# Patient Record
Sex: Male | Born: 1965 | Race: White | Hispanic: No | Marital: Single | Smoking: Never smoker
Health system: Southern US, Community
[De-identification: ages and names within clinical notes are randomized; demographics above are authoritative.]

## PROBLEM LIST (undated history)

## (undated) DIAGNOSIS — R000 Tachycardia, unspecified: Secondary | ICD-10-CM

## (undated) DIAGNOSIS — J986 Disorders of diaphragm: Secondary | ICD-10-CM

## (undated) DIAGNOSIS — I10 Essential (primary) hypertension: Secondary | ICD-10-CM

## (undated) DIAGNOSIS — E119 Type 2 diabetes mellitus without complications: Secondary | ICD-10-CM

## (undated) DIAGNOSIS — T300 Burn of unspecified body region, unspecified degree: Secondary | ICD-10-CM

## (undated) DIAGNOSIS — N433 Hydrocele, unspecified: Secondary | ICD-10-CM

## (undated) DIAGNOSIS — S82401A Unspecified fracture of shaft of right fibula, initial encounter for closed fracture: Secondary | ICD-10-CM

## (undated) DIAGNOSIS — N503 Cyst of epididymis: Secondary | ICD-10-CM

## (undated) DIAGNOSIS — K760 Fatty (change of) liver, not elsewhere classified: Secondary | ICD-10-CM

## (undated) DIAGNOSIS — M869 Osteomyelitis, unspecified: Secondary | ICD-10-CM

## (undated) DIAGNOSIS — I739 Peripheral vascular disease, unspecified: Secondary | ICD-10-CM

## (undated) DIAGNOSIS — N179 Acute kidney failure, unspecified: Secondary | ICD-10-CM

## (undated) DIAGNOSIS — Z8669 Personal history of other diseases of the nervous system and sense organs: Secondary | ICD-10-CM

## (undated) HISTORY — PX: KNEE ARTHROSCOPY: SUR90

## (undated) HISTORY — PX: TYMPANOSTOMY TUBE PLACEMENT: SHX32

## (undated) HISTORY — PX: TONSILLECTOMY: SUR1361

---

## 1997-12-17 ENCOUNTER — Emergency Department (HOSPITAL_COMMUNITY): Admission: EM | Admit: 1997-12-17 | Discharge: 1997-12-17 | Payer: Self-pay | Admitting: Emergency Medicine

## 2000-06-23 ENCOUNTER — Emergency Department (HOSPITAL_COMMUNITY): Admission: EM | Admit: 2000-06-23 | Discharge: 2000-06-24 | Payer: Self-pay | Admitting: Emergency Medicine

## 2000-06-27 ENCOUNTER — Emergency Department (HOSPITAL_COMMUNITY): Admission: EM | Admit: 2000-06-27 | Discharge: 2000-06-27 | Payer: Self-pay | Admitting: Emergency Medicine

## 2000-07-02 ENCOUNTER — Emergency Department (HOSPITAL_COMMUNITY): Admission: EM | Admit: 2000-07-02 | Discharge: 2000-07-02 | Payer: Self-pay | Admitting: Emergency Medicine

## 2000-07-24 ENCOUNTER — Encounter: Payer: Self-pay | Admitting: Emergency Medicine

## 2000-07-24 ENCOUNTER — Emergency Department (HOSPITAL_COMMUNITY): Admission: EM | Admit: 2000-07-24 | Discharge: 2000-07-24 | Payer: Self-pay | Admitting: Emergency Medicine

## 2001-09-13 ENCOUNTER — Emergency Department (HOSPITAL_COMMUNITY): Admission: EM | Admit: 2001-09-13 | Discharge: 2001-09-14 | Payer: Self-pay | Admitting: *Deleted

## 2003-05-02 ENCOUNTER — Emergency Department (HOSPITAL_COMMUNITY): Admission: EM | Admit: 2003-05-02 | Discharge: 2003-05-03 | Payer: Self-pay | Admitting: Emergency Medicine

## 2003-05-03 ENCOUNTER — Encounter: Payer: Self-pay | Admitting: Emergency Medicine

## 2003-07-01 ENCOUNTER — Emergency Department (HOSPITAL_COMMUNITY): Admission: EM | Admit: 2003-07-01 | Discharge: 2003-07-01 | Payer: Self-pay | Admitting: Emergency Medicine

## 2003-09-01 ENCOUNTER — Emergency Department (HOSPITAL_COMMUNITY): Admission: EM | Admit: 2003-09-01 | Discharge: 2003-09-01 | Payer: Self-pay | Admitting: Emergency Medicine

## 2004-04-06 ENCOUNTER — Emergency Department (HOSPITAL_COMMUNITY): Admission: EM | Admit: 2004-04-06 | Discharge: 2004-04-06 | Payer: Self-pay | Admitting: Emergency Medicine

## 2005-08-30 ENCOUNTER — Emergency Department (HOSPITAL_COMMUNITY): Admission: EM | Admit: 2005-08-30 | Discharge: 2005-08-30 | Payer: Self-pay | Admitting: Emergency Medicine

## 2005-12-18 ENCOUNTER — Emergency Department (HOSPITAL_COMMUNITY): Admission: EM | Admit: 2005-12-18 | Discharge: 2005-12-18 | Payer: Self-pay | Admitting: Emergency Medicine

## 2006-06-13 ENCOUNTER — Emergency Department (HOSPITAL_COMMUNITY): Admission: EM | Admit: 2006-06-13 | Discharge: 2006-06-13 | Payer: Self-pay | Admitting: *Deleted

## 2006-07-24 ENCOUNTER — Emergency Department (HOSPITAL_COMMUNITY): Admission: EM | Admit: 2006-07-24 | Discharge: 2006-07-25 | Payer: Self-pay | Admitting: Emergency Medicine

## 2006-07-29 ENCOUNTER — Emergency Department (HOSPITAL_COMMUNITY): Admission: EM | Admit: 2006-07-29 | Discharge: 2006-07-29 | Payer: Self-pay | Admitting: Emergency Medicine

## 2007-05-30 ENCOUNTER — Emergency Department (HOSPITAL_COMMUNITY): Admission: EM | Admit: 2007-05-30 | Discharge: 2007-05-30 | Payer: Self-pay | Admitting: Emergency Medicine

## 2007-09-04 ENCOUNTER — Emergency Department (HOSPITAL_COMMUNITY): Admission: EM | Admit: 2007-09-04 | Discharge: 2007-09-04 | Payer: Self-pay | Admitting: Family Medicine

## 2007-09-08 ENCOUNTER — Ambulatory Visit (HOSPITAL_COMMUNITY): Admission: RE | Admit: 2007-09-08 | Discharge: 2007-09-08 | Payer: Self-pay | Admitting: Specialist

## 2008-03-11 ENCOUNTER — Emergency Department (HOSPITAL_COMMUNITY): Admission: EM | Admit: 2008-03-11 | Discharge: 2008-03-11 | Payer: Self-pay | Admitting: Family Medicine

## 2008-09-02 ENCOUNTER — Emergency Department (HOSPITAL_COMMUNITY): Admission: EM | Admit: 2008-09-02 | Discharge: 2008-09-02 | Payer: Self-pay | Admitting: Family Medicine

## 2009-06-24 ENCOUNTER — Emergency Department (HOSPITAL_COMMUNITY): Admission: EM | Admit: 2009-06-24 | Discharge: 2009-06-24 | Payer: Self-pay | Admitting: Emergency Medicine

## 2010-06-07 ENCOUNTER — Emergency Department (HOSPITAL_COMMUNITY): Admission: EM | Admit: 2010-06-07 | Discharge: 2010-06-07 | Payer: Self-pay | Admitting: Emergency Medicine

## 2010-06-07 DIAGNOSIS — N503 Cyst of epididymis: Secondary | ICD-10-CM

## 2010-06-07 DIAGNOSIS — N433 Hydrocele, unspecified: Secondary | ICD-10-CM

## 2010-06-07 HISTORY — DX: Hydrocele, unspecified: N43.3

## 2010-06-07 HISTORY — DX: Cyst of epididymis: N50.3

## 2010-08-23 ENCOUNTER — Emergency Department (HOSPITAL_COMMUNITY)
Admission: EM | Admit: 2010-08-23 | Discharge: 2010-08-23 | Payer: Self-pay | Source: Home / Self Care | Admitting: Emergency Medicine

## 2010-08-27 ENCOUNTER — Emergency Department (HOSPITAL_COMMUNITY)
Admission: EM | Admit: 2010-08-27 | Discharge: 2010-08-27 | Payer: Self-pay | Source: Home / Self Care | Admitting: Emergency Medicine

## 2010-11-26 LAB — URINE CULTURE
Colony Count: NO GROWTH
Culture  Setup Time: 201109252018
Culture: NO GROWTH

## 2010-11-26 LAB — BLOOD GAS, VENOUS
Acid-base deficit: 0.3 mmol/L (ref 0.0–2.0)
Patient temperature: 98.6
TCO2: 21.8 mmol/L (ref 0–100)

## 2010-11-26 LAB — POCT I-STAT, CHEM 8
Calcium, Ion: 1.18 mmol/L (ref 1.12–1.32)
Chloride: 105 mEq/L (ref 96–112)
Glucose, Bld: 299 mg/dL — ABNORMAL HIGH (ref 70–99)
HCT: 48 % (ref 39.0–52.0)
Hemoglobin: 16.3 g/dL (ref 13.0–17.0)

## 2010-11-26 LAB — URINALYSIS, ROUTINE W REFLEX MICROSCOPIC
Glucose, UA: >1000 mg/dL — AB
Hgb urine dipstick: NEGATIVE
Ketones, ur: NEGATIVE mg/dL
Protein, ur: NEGATIVE mg/dL

## 2011-01-26 NOTE — Op Note (Signed)
NAME:  Douglas Murphy, Douglas Murphy NO.:  [id number]   MEDICAL RECORD NO.:  [id number]          PATIENT TYPE:  AMB   LOCATION:  SDS                          FACILITY:  [facility]   PHYSICIAN:  Kerrin Champagne, M.D.   DATE OF BIRTH:  1966/09/09   DATE OF PROCEDURE:  09/08/2007  DATE OF DISCHARGE:                               OPERATIVE REPORT   PREOPERATIVE DIAGNOSES:  Torn medial meniscus, internal derangement,  right knee; locked knee.   POSTOPERATIVE DIAGNOSES:  Medial shelf plica, hematoma over the medial  retinaculum of the knee with swelling and induration of the shelf plica.  Anterior horn medial meniscus tear.   PROCEDURE:  Diagnostic and operative arthroscopy, right knee, with  medial shelf plica excision, partial anterior horn medial meniscectomy  and chondroplastic shaving of medial femoral condyle.   SURGEON:  Kerrin Champagne, M.D.   ASSISTANT:  None.   ANESTHESIA:  MAC with local, Dr. Gypsy Balsam.   FINDINGS:  Torn anterior horn medial meniscus with fraying of fibers  along the anterior aspect of the cruciate ligament medially.  Degenerative chondromalacia, lateral portion, medial femoral condyle.  Hematoma over the anteromedial retinaculum with edema and swelling of  medial shelf plica.   SPECIMENS:  None.   ESTIMATED BLOOD LOSS:  10 mL.   COMPLICATIONS:  None.   TOTAL TOURNIQUET TIME:  Zero minutes.   Patient returned to the PACU in good condition.   HISTORY OF PRESENT ILLNESS:  Patient, a 45 year old male, reports having  been working Sunday, five days ago, with quite a bit of kneeling,  squatting, the following day with severe pain and swelling of the knee  with inability to either straighten the knee or flex the knee.  He had  locked right knee with inability to extend the knee greater than 30  degrees passively or actively, and flexion was only possible to about 90  degrees, inability to flex beyond this, either actively or passively.  Minimal or mild  effusion present.  He was seen in the office and  evaluated, had primarily posterior knee pain with inability to  straighten or flex the knee and findings consistent with a locked knee.  Attempts were made at manipulating the knee and were unsuccessful in the  office.  He underwent intra-articular injection with steroid and  Marcaine.  This seemed to decrease the discomfort somewhat.  Still  unable to fully straighten or extend the knee with local anesthesia.  He  was, therefore, scheduled to undergo arthroscopic evaluation for  internal derangement of the knee with likely bucket-handle tear of the  medial meniscus.   Intraoperative findings, though, demonstrated primarily medial shelf  plica and degenerative changes involving the lateral aspect, medial  femoral condyle.  There were anterior horn changes and torn meniscus  anteriorly, involving the anterior rim of the anterior meniscus of the  anterior horn.  This was debrided.  The medial shelf plica was excised.  Chondroplastic shaving undertaken with the lateral aspect of the medial  femoral condyle.   DESCRIPTION OF PROCEDURE:  After adequate face mask anesthesia, patient  had had local  anesthetic infiltrated into the right knee in the areas of  expected portals.  He had a standard prep with DuraPrep solution from  the right upper thigh to the right ankle.  A knee holder was used.  A  tourniquet was placed, but was not used during the case.  The patient  had his leg draped in the usual manner.  Coban with impervious  stockinette distally.   Patient then had further infiltration in expected areas of portals,  though slightly above those expected and slightly more medial and  slightly more lateral.  These were injected with Marcaine 0.5% with  2epinephrine, 10 mL and 5 mL each.  A stab incision was then made into  the anterolateral inferior quadrant of the knee for standard portal  here.  An #11 blade scalpel was used and then blunt  trocar used to make  an initial entry into the knee.  The sleeve for the arthroscope was then  passed through this anterolateral inferior portal and the knee then  inflated with irrigant solution and evaluated.  Immediately, torn  meniscus material was found to be present over the anterior aspect of  the patient's medial joint line.  A stab incision was made into the  medial compartment, after infiltration with Marcaine and a spinal needle  used for localization.  After stab incision was made, a probe was passed  and this was used to demonstrate the torn meniscus.  Collene Mares was then  passed and this area was shaved over the anterior aspect of the medial  meniscus and anterior horn region.   Following debridement, then evaluation of the notch demonstrated that  there were fibers along the anteromedial corner of the notch,  demonstrating further frayed appearance and these were debrided.  Lateral joint line evaluated and demonstrated no significant torn  meniscus.  The lateral portion of the intercondylar notch demonstrating  the ACL without any significant abnormalities here.  The suprapatellar  region was evaluated, as was the medial suprapatellar region, where a  large medial shelf plica was identified, as well as hematoma in the  anteromedial portion of the knee.  The medial shelf plica was enlarged  and thickened and this was debrided, using the 3.5 full-radius shaver.  Following shaving of this, then further evaluation demonstrated  chondromalacia changes, grade 2 and grade 3, involving lateral portion  of the medial femoral condyle and these were debrided, using the shaver,  back to a more smooth appearance.  With this, then the anterior horn was  further examined, some further shaving taking place to smooth the area  of previous torn cartilage.  Then irrigation was performed of the knee  joint and the arthroscope and instruments were then removed, following  removal of all irrigant  solution.   Medial and lateral portals were then closed with a single subcu stitch  of 3-0 Vicryl.  Dermabond was applied, 4 X 4's, ABD pad affixed to the  skin with Kerlix and an ACE wrap applied.  Patient was then reactivated,  extubated, reactivated and returned to the recovery room in satisfactory  condition.  All instrument and sponge counts were correct.   POSTOPERATIVE CARE:  The patient will be full weightbearing was  tolerated and to bend the knee as much as possible.  To be seen back in  the office in two weeks.  He may remove his dressings at four days  postoperatively and begin showering and bathing these areas, applying a  bandage after.  Return  to work expected on October 09, 2007.   MEDICINES AT DISCHARGE:  1. Percocet 5/325 #50 one or two every 4-6 hours p.r.n. pain.  2. Lodine etodolac 400 mg #60 one tab p.o. b.i.d.      Kerrin Champagne, M.D.  Electronically Signed     JEN/MEDQ  D:  09/08/2007  T:  09/08/2007  Job:  [id number]

## 2011-02-06 ENCOUNTER — Emergency Department (HOSPITAL_COMMUNITY)
Admission: EM | Admit: 2011-02-06 | Discharge: 2011-02-06 | Discharge status: Against Medical Advice | Payer: Self-pay | Attending: Emergency Medicine | Admitting: Emergency Medicine

## 2011-02-06 DIAGNOSIS — R070 Pain in throat: Secondary | ICD-10-CM | POA: Insufficient documentation

## 2011-02-07 ENCOUNTER — Emergency Department (HOSPITAL_COMMUNITY)
Admission: EM | Admit: 2011-02-07 | Discharge: 2011-02-07 | Disposition: A | Discharge status: Home / Self Care | Payer: Self-pay | Attending: Emergency Medicine | Admitting: Emergency Medicine

## 2011-02-07 DIAGNOSIS — R5381 Other malaise: Secondary | ICD-10-CM | POA: Insufficient documentation

## 2011-02-07 DIAGNOSIS — R059 Cough, unspecified: Secondary | ICD-10-CM | POA: Insufficient documentation

## 2011-02-07 DIAGNOSIS — R599 Enlarged lymph nodes, unspecified: Secondary | ICD-10-CM | POA: Insufficient documentation

## 2011-02-07 DIAGNOSIS — R0982 Postnasal drip: Secondary | ICD-10-CM | POA: Insufficient documentation

## 2011-02-07 DIAGNOSIS — H9209 Otalgia, unspecified ear: Secondary | ICD-10-CM | POA: Insufficient documentation

## 2011-02-07 DIAGNOSIS — R05 Cough: Secondary | ICD-10-CM | POA: Insufficient documentation

## 2011-02-07 DIAGNOSIS — J029 Acute pharyngitis, unspecified: Secondary | ICD-10-CM | POA: Insufficient documentation

## 2011-06-17 LAB — POCT RAPID STREP A: Streptococcus, Group A Screen (Direct): NEGATIVE

## 2011-06-18 LAB — CBC
HCT: 43.5
Hemoglobin: 14.7
RDW: 13.4

## 2011-06-24 LAB — POCT URINALYSIS DIP (DEVICE)
Bilirubin Urine: NEGATIVE
Hgb urine dipstick: NEGATIVE
Nitrite: NEGATIVE
Specific Gravity, Urine: 1.02
pH: 6

## 2012-04-17 ENCOUNTER — Encounter (HOSPITAL_COMMUNITY): Payer: Self-pay | Admitting: *Deleted

## 2012-04-17 ENCOUNTER — Emergency Department (HOSPITAL_COMMUNITY)
Admission: EM | Admit: 2012-04-17 | Discharge: 2012-04-17 | Disposition: A | Discharge status: Home / Self Care | Payer: Self-pay | Attending: Emergency Medicine | Admitting: Emergency Medicine

## 2012-04-17 DIAGNOSIS — M542 Cervicalgia: Secondary | ICD-10-CM | POA: Insufficient documentation

## 2012-04-17 DIAGNOSIS — M5382 Other specified dorsopathies, cervical region: Secondary | ICD-10-CM

## 2012-04-17 DIAGNOSIS — M25519 Pain in unspecified shoulder: Secondary | ICD-10-CM | POA: Insufficient documentation

## 2012-04-17 MED ORDER — CYCLOBENZAPRINE HCL 10 MG PO TABS: 10.0000 mg | ORAL_TABLET | Freq: Two times a day (BID) | ORAL | Status: AC | PRN

## 2012-04-17 MED ORDER — IBUPROFEN 200 MG PO TABS: 400.0000 mg | ORAL_TABLET | Freq: Three times a day (TID) | ORAL | Status: AC | PRN

## 2012-04-17 NOTE — ED Provider Notes (Signed)
History     CSN: [id number]  Arrival date & time 04/17/12  0704   First MD Initiated Contact with Patient 04/17/12 [phone]      Chief Complaint  Patient presents with  . Shoulder Pain  . Neck Pain  . Back Pain    (Consider location/radiation/quality/duration/timing/severity/associated sxs/prior treatment) HPI This is a 46 year old man without significant past medical history who presents with right shoulder and neck pain. The pain started last night and it has been constant since then data on a radial 6/10. The pain is dull in nature and increased by head movement to the left side and raising the right shoulder. Without these movements his pain is about 2/10. He was mowing his yard yesterday and he remembers jacking his right hand and shoulder. No other complaints.   History reviewed. No pertinent past medical history.  Past Surgical History  Procedure Date  . Knee arthroscopy     No family history on file.  History  Substance Use Topics  . Smoking status: Never Smoker   . Smokeless tobacco: Not on file  . Alcohol Use: No      Review of Systems  All other systems reviewed and are negative.    Allergies  Review of patient's allergies indicates no known allergies.  Home Medications  No current outpatient prescriptions on file.  BP 123/97  Pulse 78  Temp 98 F (36.7 C) (Oral)  Resp 16  Ht 6' (1.829 m)  Wt 215 lb (97.523 kg)  BMI 29.16 kg/m2  SpO2 100%  Physical Exam  Constitutional: He is oriented to person, place, and time. He appears well-developed and well-nourished. No distress.  HENT:  Head: Normocephalic and atraumatic.  Eyes: Conjunctivae are normal. Pupils are equal, round, and reactive to light.  Neck: Normal range of motion. Neck supple.    Cardiovascular: Normal rate and regular rhythm.   Pulmonary/Chest: Effort normal and breath sounds normal.  Abdominal: Soft. Bowel sounds are normal.  Neurological: He is alert and oriented to person,  place, and time.  Skin: Skin is warm and dry. He is not diaphoretic.    ED Course  Procedures (including critical care time)     MDM  Right Shoulder and neck pain this otherwise health man is musculoskeletal pain. I will give him a course of Motrin. Encourage to rest his right shoulder for 3 days.     Dow Adolph, MD 04/17/12 [phone]

## 2012-04-17 NOTE — ED Notes (Signed)
Pt reports onset of right shoulder, neck, upper back pain since last night. Denies known injury.

## 2012-04-17 NOTE — ED Provider Notes (Signed)
I saw and evaluated the patient, reviewed the resident's note and I agree with the findings and plan.  Pt with R neck muscle spasm. No injury, no neurologic complaints. Motrin/Flexeril and work note.   Charles B. Bernette Mayers, MD 04/17/12 [phone]

## 2012-07-11 ENCOUNTER — Emergency Department (HOSPITAL_COMMUNITY)
Admission: EM | Admit: 2012-07-11 | Discharge: 2012-07-11 | Disposition: A | Discharge status: Home / Self Care | Payer: Self-pay | Attending: Emergency Medicine | Admitting: Emergency Medicine

## 2012-07-11 ENCOUNTER — Encounter (HOSPITAL_COMMUNITY): Payer: Self-pay | Admitting: Emergency Medicine

## 2012-07-11 DIAGNOSIS — M79609 Pain in unspecified limb: Secondary | ICD-10-CM | POA: Insufficient documentation

## 2012-07-11 DIAGNOSIS — R1030 Lower abdominal pain, unspecified: Secondary | ICD-10-CM

## 2012-07-11 DIAGNOSIS — E119 Type 2 diabetes mellitus without complications: Secondary | ICD-10-CM | POA: Insufficient documentation

## 2012-07-11 DIAGNOSIS — R109 Unspecified abdominal pain: Secondary | ICD-10-CM | POA: Insufficient documentation

## 2012-07-11 DIAGNOSIS — R197 Diarrhea, unspecified: Secondary | ICD-10-CM | POA: Insufficient documentation

## 2012-07-11 LAB — POCT I-STAT, CHEM 8
BUN: 19 mg/dL (ref 6–23)
Creatinine, Ser: 0.8 mg/dL (ref 0.50–1.35)
Glucose, Bld: 339 mg/dL — ABNORMAL HIGH (ref 70–99)
Hemoglobin: 15.6 g/dL (ref 13.0–17.0)
TCO2: 26 mmol/L (ref 0–100)

## 2012-07-11 LAB — URINALYSIS, ROUTINE W REFLEX MICROSCOPIC
Bilirubin Urine: NEGATIVE
Glucose, UA: >1000 mg/dL — AB
Hgb urine dipstick: NEGATIVE
Specific Gravity, Urine: 1.042 — ABNORMAL HIGH (ref 1.005–1.030)
pH: 6.5 (ref 5.0–8.0)

## 2012-07-11 LAB — CBC WITH DIFFERENTIAL/PLATELET
Eosinophils Absolute: 0.3 10*3/uL (ref 0.0–0.7)
Eosinophils Relative: 4 % (ref 0–5)
HCT: 42.8 % (ref 39.0–52.0)
Hemoglobin: 15.4 g/dL (ref 13.0–17.0)
Lymphocytes Relative: 38 % (ref 12–46)
Lymphs Abs: 3.2 10*3/uL (ref 0.7–4.0)
MCH: 30.7 pg (ref 26.0–34.0)
MCV: 85.4 fL (ref 78.0–100.0)
Monocytes Relative: 7 % (ref 3–12)
RBC: 5.01 MIL/uL (ref 4.22–5.81)

## 2012-07-11 LAB — GLUCOSE, CAPILLARY: Glucose-Capillary: 347 mg/dL — ABNORMAL HIGH (ref 70–99)

## 2012-07-11 LAB — URINE MICROSCOPIC-ADD ON

## 2012-07-11 MED ORDER — TRAMADOL HCL 50 MG PO TABS: 50.0000 mg | ORAL_TABLET | Freq: Four times a day (QID) | ORAL | Status: DC | PRN

## 2012-07-11 MED ORDER — HYDROCODONE-ACETAMINOPHEN 5-325 MG PO TABS
1.0000 | ORAL_TABLET | Freq: Once | ORAL | Status: DC
  Filled 2012-07-11: qty 1

## 2012-07-11 MED ORDER — METFORMIN HCL 500 MG PO TABS: 500.0000 mg | ORAL_TABLET | Freq: Two times a day (BID) | ORAL | Status: DC

## 2012-07-11 NOTE — ED Notes (Signed)
Patient states started having R abdominal pain, R groin pain and RLE pain starting yesterday.  Patient states did have some diarrhea yesterday, but denies today.   Patient described pain as 7/10 sharp intermittent pain.

## 2012-07-11 NOTE — ED Provider Notes (Signed)
History     CSN: [id number]  Arrival date & time 07/11/12  0714   First MD Initiated Contact with Patient 07/11/12 [phone]      Chief Complaint  Patient presents with  . Abdominal Pain  . Groin Pain  . Leg Pain    (Consider location/radiation/quality/duration/timing/severity/associated sxs/prior treatment) HPI Comments: Patient presents with c/o R lower quadrant abd pain, R groin, R upper leg, and bilateral testicular pain since last evening. Pain described as dull and aching. It is 7/10. No fever, N/V. No urinary symptoms including dysuria, hematuria. No hernia. Pt states that he had episode of diarrhea last night. No definite injury. Pain is worse with movement and palpation. Nothing makes it better, no treatments prior to arrival. No h/o abdominal surgeries. No h/o blood clots. No recent immobilizations or surgeries. The onset of this condition was gradual. The course is constant.    The history is provided by the patient.    History reviewed. No pertinent past medical history.  Past Surgical History  Procedure Date  . Knee arthroscopy     No family history on file.  History  Substance Use Topics  . Smoking status: Never Smoker   . Smokeless tobacco: Not on file  . Alcohol Use: No      Review of Systems  Constitutional: Negative for fever.  HENT: Negative for sore throat and rhinorrhea.   Eyes: Negative for redness.  Respiratory: Negative for cough.   Cardiovascular: Negative for chest pain and leg swelling.  Gastrointestinal: Positive for abdominal pain and diarrhea. Negative for nausea and vomiting.  Genitourinary: Positive for testicular pain. Negative for dysuria, urgency, frequency, hematuria, flank pain, decreased urine volume, discharge, penile swelling, scrotal swelling and difficulty urinating.  Musculoskeletal: Negative for myalgias.  Skin: Negative for rash.  Neurological: Negative for headaches.    Allergies  Review of patient's allergies indicates  no known allergies.  Home Medications   Current Outpatient Rx  Name Route Sig Dispense Refill  . ALKA-SELTZER ANTACID PO Oral Take 1 tablet by mouth daily as needed. For stomach acid      BP 125/85  Pulse 90  Temp 97.8 F (36.6 C) (Oral)  Resp 16  Ht 6' (1.829 m)  Wt 210 lb (95.255 kg)  BMI 28.48 kg/m2  SpO2 98%  Physical Exam  Nursing note and vitals reviewed. Constitutional: He appears well-developed and well-nourished.  HENT:  Head: Normocephalic and atraumatic.  Eyes: Conjunctivae normal are normal. Right eye exhibits no discharge. Left eye exhibits no discharge.  Neck: Normal range of motion. Neck supple.  Cardiovascular: Normal rate, regular rhythm and normal heart sounds.   Pulmonary/Chest: Effort normal and breath sounds normal.  Abdominal: Soft. There is tenderness in the right lower quadrant. There is no rebound and no guarding. Hernia confirmed negative in the right inguinal area and confirmed negative in the left inguinal area.    Genitourinary: Right testis shows tenderness. Right testis shows no mass and no swelling. Left testis shows tenderness. Left testis shows no mass and no swelling. No penile erythema. No discharge found.  Musculoskeletal:       Right hip: He exhibits tenderness. He exhibits normal range of motion and normal strength.       Legs: Lymphadenopathy:       Right: No inguinal adenopathy present.       Left: No inguinal adenopathy present.  Neurological: He is alert.  Skin: Skin is warm and dry.  Psychiatric: He has a normal mood and affect.  ED Course  Procedures (including critical care time)  Labs Reviewed  URINALYSIS, ROUTINE W REFLEX MICROSCOPIC - Abnormal; Notable for the following:    Specific Gravity, Urine 1.042 (*)     Glucose, UA >1000 (*)     Ketones, ur 15 (*)     All other components within normal limits  URINE MICROSCOPIC-ADD ON - Abnormal; Notable for the following:    Bacteria, UA FEW (*)     All other components  within normal limits  GLUCOSE, CAPILLARY - Abnormal; Notable for the following:    Glucose-Capillary 347 (*)     All other components within normal limits  POCT I-STAT, CHEM 8 - Abnormal; Notable for the following:    Glucose, Bld 339 (*)     Calcium, Ion 1.24 (*)     All other components within normal limits  CBC WITH DIFFERENTIAL   No results found.   1. Groin pain   2. Diabetes     7:33 AM Patient seen and examined. Work-up initiated. Medications ordered.   Vital signs reviewed and are as follows: Filed Vitals:   07/11/12 0726  BP: 125/85  Pulse: 90  Temp: 97.8 F (36.6 C)  Resp: 16   RLQ abd pain/groin pain/upper leg pain: no discernable inguinal or abdominal hernia, no severe pain to tenderness to suggest torsion, no leg swelling to suggest DVT, no back pain or hematuria to suggest ureteral stone, no skin findings to suggest cellulitis. At this point findings seem musculoskeletal in nature.   CBC and UA ordered and are pending.   9:03 AM CBG 347. Will check kidney fcn and start on metformin. Patient informed that he will need PCP f/u. States his father saw Dr. Waynard Edwards in past and can try to follow-up. Both mother and father had DM as adults.    9:17 AM Patient d/w and seen by Dr. Anitra Lauth. Bedside ultrasound used to visualize femoral vein. Vein is easily compressible along the upper thigh. No evidence of DVT.   10:04 AM Renal fcn normal. Patient counseled on metformin use. Again stressed importance of PCP follow-up. Referrals given.   Patient urged to return with worsening symptoms or other concerns. Patient verbalized understanding and agrees with plan.     MDM  Groin pain: likely muscle strain  DM: new diagnosis, started on metformin 500mg  bid. Urged PCP follow-up.        Renne Crigler, [place] 07/11/12 1005

## 2012-07-11 NOTE — ED Provider Notes (Signed)
Medical screening examination/treatment/procedure(s) were conducted as a shared visit with non-physician practitioner(s) and myself.  I personally evaluated the patient during the encounter Patient with right lower abdominal tenderness and upper thigh pain. Denies any urinary symptoms and is well-appearing. He is able to flex and extend his thigh without difficulty. Bedside ultrasound there is an easily compressible femoral vein. Labs are normal except for a new diagnosis of diabetes. Patient was placed on metformin and will followup with PCP  Gwyneth Sprout, MD 07/11/12 1132

## 2012-09-03 ENCOUNTER — Emergency Department (HOSPITAL_COMMUNITY): Payer: Self-pay

## 2012-09-03 ENCOUNTER — Encounter (HOSPITAL_COMMUNITY): Payer: Self-pay | Admitting: Emergency Medicine

## 2012-09-03 ENCOUNTER — Emergency Department (HOSPITAL_COMMUNITY)
Admission: EM | Admit: 2012-09-03 | Discharge: 2012-09-03 | Disposition: A | Discharge status: Home / Self Care | Payer: Self-pay | Attending: Emergency Medicine | Admitting: Emergency Medicine

## 2012-09-03 DIAGNOSIS — IMO0001 Reserved for inherently not codable concepts without codable children: Secondary | ICD-10-CM

## 2012-09-03 DIAGNOSIS — R739 Hyperglycemia, unspecified: Secondary | ICD-10-CM

## 2012-09-03 DIAGNOSIS — R110 Nausea: Secondary | ICD-10-CM | POA: Insufficient documentation

## 2012-09-03 DIAGNOSIS — R1013 Epigastric pain: Secondary | ICD-10-CM | POA: Insufficient documentation

## 2012-09-03 DIAGNOSIS — K219 Gastro-esophageal reflux disease without esophagitis: Secondary | ICD-10-CM | POA: Insufficient documentation

## 2012-09-03 DIAGNOSIS — R509 Fever, unspecified: Secondary | ICD-10-CM | POA: Insufficient documentation

## 2012-09-03 DIAGNOSIS — R7309 Other abnormal glucose: Secondary | ICD-10-CM | POA: Insufficient documentation

## 2012-09-03 LAB — URINALYSIS, MICROSCOPIC ONLY
Bilirubin Urine: NEGATIVE
Leukocytes, UA: NEGATIVE
Nitrite: NEGATIVE
Specific Gravity, Urine: 1.042 — ABNORMAL HIGH (ref 1.005–1.030)
Urine-Other: NONE SEEN
Urobilinogen, UA: 0.2 mg/dL (ref 0.0–1.0)
pH: 7 (ref 5.0–8.0)

## 2012-09-03 LAB — COMPREHENSIVE METABOLIC PANEL
Albumin: 3.4 g/dL — ABNORMAL LOW (ref 3.5–5.2)
Alkaline Phosphatase: 132 U/L — ABNORMAL HIGH (ref 39–117)
BUN: 14 mg/dL (ref 6–23)
Calcium: 9.5 mg/dL (ref 8.4–10.5)
GFR calc Af Amer: >90 mL/min (ref >90)
Potassium: 4.2 mEq/L (ref 3.5–5.1)
Sodium: 133 mEq/L — ABNORMAL LOW (ref 135–145)
Total Protein: 6.9 g/dL (ref 6.0–8.3)

## 2012-09-03 LAB — CBC WITH DIFFERENTIAL/PLATELET
Basophils Relative: 0 % (ref 0–1)
Eosinophils Absolute: 0.3 10*3/uL (ref 0.0–0.7)
Eosinophils Relative: 4 % (ref 0–5)
MCH: 30.2 pg (ref 26.0–34.0)
MCHC: 35.6 g/dL (ref 30.0–36.0)
MCV: 85 fL (ref 78.0–100.0)
Monocytes Relative: 11 % (ref 3–12)
Neutrophils Relative %: 56 % (ref 43–77)
Platelets: 171 10*3/uL (ref 150–400)

## 2012-09-03 LAB — LIPASE, BLOOD: Lipase: 24 U/L (ref 11–59)

## 2012-09-03 LAB — RAPID STREP SCREEN (MED CTR MEBANE ONLY): Streptococcus, Group A Screen (Direct): NEGATIVE

## 2012-09-03 LAB — GLUCOSE, CAPILLARY: Glucose-Capillary: 299 mg/dL — ABNORMAL HIGH (ref 70–99)

## 2012-09-03 MED ORDER — SODIUM CHLORIDE 0.9 % IV BOLUS (SEPSIS)
1000.0000 mL | Freq: Once | INTRAVENOUS | Status: AC
  Administered 2012-09-03: 1000 mL via INTRAVENOUS

## 2012-09-03 MED ORDER — SODIUM CHLORIDE 0.9 % IV SOLN
1000.0000 mL | Freq: Once | INTRAVENOUS | Status: AC
  Administered 2012-09-03: 1000 mL via INTRAVENOUS

## 2012-09-03 MED ORDER — GI COCKTAIL ~~LOC~~
30.0000 mL | Freq: Once | ORAL | Status: AC
  Administered 2012-09-03: 30 mL via ORAL
  Filled 2012-09-03: qty 30

## 2012-09-03 MED ORDER — ONDANSETRON HCL 4 MG/2ML IJ SOLN
4.0000 mg | Freq: Once | INTRAMUSCULAR | Status: AC
  Administered 2012-09-03: 4 mg via INTRAVENOUS
  Filled 2012-09-03: qty 2

## 2012-09-03 MED ORDER — FAMOTIDINE IN NACL 20-0.9 MG/50ML-% IV SOLN
20.0000 mg | Freq: Once | INTRAVENOUS | Status: AC
  Administered 2012-09-03: 20 mg via INTRAVENOUS
  Filled 2012-09-03: qty 50

## 2012-09-03 MED ORDER — METFORMIN HCL 500 MG PO TABS: 500.0000 mg | ORAL_TABLET | Freq: Two times a day (BID) | ORAL | Status: DC

## 2012-09-03 MED ORDER — METOCLOPRAMIDE HCL 10 MG PO TABS: 10.0000 mg | ORAL_TABLET | Freq: Four times a day (QID) | ORAL | Status: DC

## 2012-09-03 NOTE — ED Notes (Signed)
Bedside report received from previous RN 

## 2012-09-03 NOTE — ED Notes (Addendum)
Pt back from X-ray.  

## 2012-09-03 NOTE — ED Notes (Signed)
Pt presenting to ed with c/o sore throat x 3 days and abdominal pain with positive nausea no vomiting no diarrhea.

## 2012-09-03 NOTE — ED Provider Notes (Signed)
History     CSN: [id number]  Arrival date & time 09/03/12  1604   First MD Initiated Contact with Patient 09/03/12 1628      Chief Complaint  Patient presents with  . Sore Throat  . Abdominal Pain    (Consider location/radiation/quality/duration/timing/severity/associated sxs/prior treatment) The history is provided by the patient.  Arun Herrod is a 46 y.o. male here with ab pain, sore throat. Sore throat 3 days ago, subjective fever. No difficulty swallowing but felt nauseous. He is able to eat food but is nauseous but denies vomiting or diarrhea. He took some Alka-Seltzer but it didn't help. Some epigastric pain but denies any abdominal surgeries in the past. No chest pain no cough.   History reviewed. No pertinent past medical history.  Past Surgical History  Procedure Date  . Knee arthroscopy     No family history on file.  History  Substance Use Topics  . Smoking status: Never Smoker   . Smokeless tobacco: Not on file  . Alcohol Use: No      Review of Systems  HENT: Positive for sore throat.   Gastrointestinal: Positive for nausea and abdominal pain.  All other systems reviewed and are negative.    Allergies  Review of patient's allergies indicates no known allergies.  Home Medications   Current Outpatient Rx  Name  Route  Sig  Dispense  Refill  . THERAFLU FLU/COLD PO   Oral   Take 1 packet by mouth daily as needed. For flu symptoms.           BP 130/88  Pulse 98  Temp 98.1 F (36.7 C) (Oral)  Resp 20  SpO2 97%  Physical Exam  Nursing note and vitals reviewed. Constitutional: He is oriented to person, place, and time. He appears well-developed and well-nourished.  HENT:  Head: Normocephalic.       OP- mild erythema   Eyes: Conjunctivae normal are normal. Pupils are equal, round, and reactive to light.  Neck: Normal range of motion. Neck supple.       No cervical LAD   Cardiovascular: Normal rate, regular rhythm and normal  heart sounds.   Pulmonary/Chest: Effort normal and breath sounds normal. No respiratory distress. He has no wheezes. He has no rales.  Abdominal: Soft. Bowel sounds are normal.       Mild epigastric tenderness, no murphy sign.   Musculoskeletal: Normal range of motion.  Neurological: He is alert and oriented to person, place, and time.  Skin: Skin is warm and dry.  Psychiatric: He has a normal mood and affect. His behavior is normal. Judgment and thought content normal.    ED Course  Procedures (including critical care time)  Labs Reviewed  COMPREHENSIVE METABOLIC PANEL - Abnormal; Notable for the following:    Sodium 133 (*)     Chloride 95 (*)     Glucose, Bld 452 (*)     Albumin 3.4 (*)     ALT 58 (*)     Alkaline Phosphatase 132 (*)     Total Bilirubin 0.2 (*)     All other components within normal limits  URINALYSIS, MICROSCOPIC ONLY - Abnormal; Notable for the following:    APPearance CLOUDY (*)     Specific Gravity, Urine 1.042 (*)     Glucose, UA >1000 (*)     All other components within normal limits  GLUCOSE, CAPILLARY - Abnormal; Notable for the following:    Glucose-Capillary 299 (*)  All other components within normal limits  CBC WITH DIFFERENTIAL  LIPASE, BLOOD  RAPID STREP SCREEN   Dg Abd Acute W/chest  09/03/2012  *RADIOLOGY REPORT*  Clinical Data: Abdominal pain  ACUTE ABDOMEN SERIES (ABDOMEN 2 VIEW & CHEST 1 VIEW)  Comparison:  None.  Findings:  There is no evidence of dilated bowel loops or free intraperitoneal air.  No radiopaque calculi or other significant radiographic abnormality is seen. Heart size and mediastinal contours are within normal limits.  Both lungs are clear.  IMPRESSION: Negative abdominal radiographs.  No acute cardiopulmonary disease.   Original Report Authenticated By: Christiana Pellant, M.D.      No diagnosis found.    MDM  Kert Shackett is a 46 y.o. male here with epigastric pain, sore throat. Will get strep swab. He  likely has viral syndrome vs reflux. Will get labs, lipase, ab xrays. Will reassess after GI cocktail and zofran and fluids.   7:51 PM Patient felt better. Glucose 450 on CMP. I looked over previous notes, patient prescribed metformin 500mg  BID 2 months ago but hasn't been taking it or following up. Patient has no anion gap on CMP. After 2L IVF, glucose dec to 299. Xray ab unremarkable. I think his symptoms are consistent with hyperglycemia but he is not in DKA. I told him to take metform and gave him list of resources to follow up.        Richardean Canal, MD 09/03/12 [phone]

## 2012-09-03 NOTE — ED Notes (Signed)
Reports abdominal pain for 2 days and points all over abd. accompanied with nausea on & off. Rated worse pain scale 5/10 now 3-4/10.  Last BM today WNL. 3 days ago experienced sore throat.  General assessment is a patient conscious and alert with acute distress.

## 2012-11-11 DIAGNOSIS — Z8669 Personal history of other diseases of the nervous system and sense organs: Secondary | ICD-10-CM

## 2012-11-11 HISTORY — DX: Personal history of other diseases of the nervous system and sense organs: Z86.69

## 2012-11-18 ENCOUNTER — Emergency Department (HOSPITAL_COMMUNITY)
Admission: EM | Admit: 2012-11-18 | Discharge: 2012-11-18 | Disposition: A | Discharge status: Home / Self Care | Payer: Self-pay | Attending: Emergency Medicine | Admitting: Emergency Medicine

## 2012-11-18 DIAGNOSIS — G510 Bell's palsy: Secondary | ICD-10-CM | POA: Insufficient documentation

## 2012-11-18 DIAGNOSIS — E119 Type 2 diabetes mellitus without complications: Secondary | ICD-10-CM | POA: Insufficient documentation

## 2012-11-18 DIAGNOSIS — R29810 Facial weakness: Secondary | ICD-10-CM | POA: Insufficient documentation

## 2012-11-18 DIAGNOSIS — R5381 Other malaise: Secondary | ICD-10-CM | POA: Insufficient documentation

## 2012-11-18 LAB — GLUCOSE, CAPILLARY

## 2012-11-18 MED ORDER — METFORMIN HCL 500 MG PO TABS: 500.0000 mg | ORAL_TABLET | Freq: Two times a day (BID) | ORAL | Status: DC

## 2012-11-18 MED ORDER — ERYTHROMYCIN 5 MG/GM OP OINT
TOPICAL_OINTMENT | Freq: Every day | OPHTHALMIC | Status: DC
  Administered 2012-11-18: 20:00:00 via OPHTHALMIC
  Filled 2012-11-18: qty 3.5

## 2012-11-18 MED ORDER — ACYCLOVIR 400 MG PO TABS: 400.0000 mg | ORAL_TABLET | Freq: Four times a day (QID) | ORAL | Status: DC

## 2012-11-18 MED ORDER — PREDNISONE 20 MG PO TABS: 40.0000 mg | ORAL_TABLET | Freq: Every day | ORAL | Status: DC

## 2012-11-18 NOTE — ED Notes (Signed)
RUE:[id number]<JW> Expected date:<BR> Expected time:<BR> Means of arrival:<BR> Comments:<BR> hold

## 2012-11-18 NOTE — ED Provider Notes (Addendum)
History     CSN: [id number]  Arrival date & time 11/18/12  1248   First MD Initiated Contact with Patient 11/18/12 1756      Chief Complaint  Patient presents with  . Numbness    (Consider location/radiation/quality/duration/timing/severity/associated sxs/prior treatment) Patient is a 47 y.o. male presenting with weakness. The history is provided by the patient.  Weakness This is a new problem. The current episode started yesterday. The problem occurs constantly. The problem has not changed since onset.Pertinent negatives include no headaches and no shortness of breath. Associated symptoms comments: Left sided facial droop starting last night without any pain or numbness. Nothing aggravates the symptoms. Nothing relieves the symptoms. He has tried nothing for the symptoms. The treatment provided no relief.    No past medical history on file.  Past Surgical History  Procedure Laterality Date  . Knee arthroscopy      No family history on file.  History  Substance Use Topics  . Smoking status: Never Smoker   . Smokeless tobacco: Not on file  . Alcohol Use: No      Review of Systems  Respiratory: Negative for cough and shortness of breath.   Neurological: Positive for facial asymmetry and weakness. Negative for seizures, speech difficulty, numbness and headaches.  All other systems reviewed and are negative.    Allergies  Review of patient's allergies indicates no known allergies.  Home Medications  No current outpatient prescriptions on file.  BP 118/80  Pulse 91  Temp(Src) 98.6 F (37 C) (Oral)  Resp 18  SpO2 99%  Physical Exam  Nursing note and vitals reviewed. Constitutional: He is oriented to person, place, and time. He appears well-developed and well-nourished. No distress.  HENT:  Head: Normocephalic and atraumatic.  Right Ear: Tympanic membrane and ear canal normal.  Left Ear: Tympanic membrane and ear canal normal.  Mouth/Throat: Oropharynx is  clear and moist.  Eyes: Conjunctivae and EOM are normal. Pupils are equal, round, and reactive to light.  Neck: Normal range of motion. Neck supple.  Cardiovascular: Normal rate, regular rhythm and intact distal pulses.   No murmur heard. Pulmonary/Chest: Effort normal and breath sounds normal. No respiratory distress. He has no wheezes. He has no rales.  Abdominal: Soft. He exhibits no distension. There is no tenderness. There is no rebound and no guarding.  Musculoskeletal: Normal range of motion. He exhibits no edema and no tenderness.  Neurological: He is alert and oriented to person, place, and time. He has normal strength. A cranial nerve deficit is present. No sensory deficit. He displays a negative Romberg sign. Coordination and gait normal.  Left sided facial droop.  Asymmetrical smile. Unable to for a brow on the left. Normal sensation bilaterally. Is able to close eye completely  Skin: Skin is warm and dry. No rash noted. No erythema.  Psychiatric: He has a normal mood and affect. His behavior is normal.    ED Course  Procedures (including critical care time)  Labs Reviewed  GLUCOSE, CAPILLARY - Abnormal; Notable for the following:    Glucose-Capillary 321 (*)    All other components within normal limits   No results found.   1. Bell's palsy       MDM   Patient presenting complaining of left-sided facial droop that started last night about 7:30 PM. He denies any other symptoms and has classic symptoms of Bell's palsy on exam. He has no other medical issues except for a intermittent elevated blood pressure on CBG checks.  We'll recheck his CBG today and start him on acyclovir and prednisone. He will followup with a PCP.  7:04 PM Pt also has a BS of 327 and pt was started on metformin.  He has had multiple elevated blood sugars in the past and was not started on meds.      Gwyneth Sprout, MD 11/18/12 1825  Gwyneth Sprout, MD 11/18/12 7829

## 2012-11-18 NOTE — ED Notes (Addendum)
Pt states that he noticed left sided facial numbness around 0730 last night.  No other sx.  No medical problems.  States his dad had bell's palsy.

## 2013-01-16 ENCOUNTER — Emergency Department (HOSPITAL_COMMUNITY)
Admission: EM | Admit: 2013-01-16 | Discharge: 2013-01-16 | Disposition: A | Discharge status: Home / Self Care | Payer: Self-pay | Attending: Emergency Medicine | Admitting: Emergency Medicine

## 2013-01-16 ENCOUNTER — Encounter (HOSPITAL_COMMUNITY): Payer: Self-pay | Admitting: Emergency Medicine

## 2013-01-16 DIAGNOSIS — H60509 Unspecified acute noninfective otitis externa, unspecified ear: Secondary | ICD-10-CM | POA: Insufficient documentation

## 2013-01-16 DIAGNOSIS — H6092 Unspecified otitis externa, left ear: Secondary | ICD-10-CM

## 2013-01-16 MED ORDER — NEOMYCIN-COLIST-HC-THONZONIUM 3.3-3-10-0.5 MG/ML OT SUSP: 3.0000 [drp] | Freq: Once | OTIC | Status: DC

## 2013-01-16 MED ORDER — NEOMYCIN-POLYMYXIN-HC 3.5-10000-1 OT SUSP
3.0000 [drp] | Freq: Once | OTIC | Status: AC
  Administered 2013-01-16: 3 [drp] via OTIC
  Filled 2013-01-16: qty 10

## 2013-01-16 NOTE — ED Provider Notes (Signed)
History     CSN: [id number]  Arrival date & time 01/16/13  0049   First MD Initiated Contact with Patient 01/16/13 0159      Chief Complaint  Patient presents with  . Otalgia    (Consider location/radiation/quality/duration/timing/severity/associated sxs/prior treatment) HPI Comments: Patient states, that 2 days ago.  His left ear started hurting.  Denies any drainage, fever, stuffiness, seasonal allergies.  Denies placing any foreign objects in his ear  Patient is a 47 y.o. male presenting with ear pain. The history is provided by the patient.  Otalgia Location:  Left Quality:  Aching Severity:  Mild Onset quality:  Gradual Duration:  3 days Timing:  Constant Progression:  Worsening Chronicity:  New Relieved by:  None tried Worsened by:  Nothing tried Ineffective treatments:  None tried Associated symptoms: no congestion, no ear discharge, no fever, no hearing loss, no neck pain, no rhinorrhea and no sore throat     History reviewed. No pertinent past medical history.  Past Surgical History  Procedure Laterality Date  . Knee arthroscopy      History reviewed. No pertinent family history.  History  Substance Use Topics  . Smoking status: Never Smoker   . Smokeless tobacco: Never Used  . Alcohol Use: No      Review of Systems  Constitutional: Negative for fever and chills.  HENT: Positive for ear pain. Negative for hearing loss, congestion, sore throat, rhinorrhea, trouble swallowing, neck pain and ear discharge.   Gastrointestinal: Negative for nausea.  Neurological: Negative for dizziness.  All other systems reviewed and are negative.    Allergies  Review of patient's allergies indicates no known allergies.  Home Medications  No current outpatient prescriptions on file.  BP 136/94  Pulse 85  Temp(Src) 98.1 F (36.7 C) (Oral)  Resp 18  SpO2 98%  Physical Exam  Nursing note and vitals reviewed. Constitutional: He is oriented to person, place,  and time. He appears well-developed and well-nourished.  HENT:  Head: Normocephalic and atraumatic.  Right Ear: External ear normal.  Left Ear: Hearing and tympanic membrane normal. No swelling or tenderness. Tympanic membrane is not injected.  Mouth/Throat: Oropharynx is clear and moist.  Left ear canal, red, inflamed, swollen.  TM intact.  No drainage noted  Cardiovascular: Normal rate and regular rhythm.   Pulmonary/Chest: Effort normal.  Musculoskeletal: Normal range of motion.  Neurological: He is alert and oriented to person, place, and time.  Skin: Skin is warm. No rash noted. No erythema.    ED Course  Procedures (including critical care time)  Labs Reviewed - No data to display No results found.   1. Otitis externa, left       MDM   Patient has the beginnings of, otitis externa.  We'll treat with Cortisporin otic, and refer him to ENT.  Followup        Arman Filter, NP 01/16/13 [phone]

## 2013-01-16 NOTE — ED Notes (Signed)
PT. PRESENTS WITH PERSISTENT LEFT EAR ACHE WITH NO DRAINAGE , DENIES INJURY , NO HEARING LOSS.

## 2013-01-16 NOTE — ED Provider Notes (Signed)
Medical screening examination/treatment/procedure(s) were performed by non-physician practitioner and as supervising physician I was immediately available for consultation/collaboration.  Kaleesi Guyton M Keilly Fatula, MD 01/16/13 0412 

## 2013-02-17 ENCOUNTER — Emergency Department (INDEPENDENT_AMBULATORY_CARE_PROVIDER_SITE_OTHER)
Admission: EM | Admit: 2013-02-17 | Discharge: 2013-02-17 | Disposition: A | Discharge status: Home / Self Care | Payer: Self-pay | Source: Home / Self Care | Attending: Emergency Medicine | Admitting: Emergency Medicine

## 2013-02-17 ENCOUNTER — Encounter (HOSPITAL_COMMUNITY): Payer: Self-pay

## 2013-02-17 DIAGNOSIS — K0401 Reversible pulpitis: Secondary | ICD-10-CM

## 2013-02-17 MED ORDER — IBUPROFEN 800 MG PO TABS: 800.0000 mg | ORAL_TABLET | Freq: Three times a day (TID) | ORAL | Status: DC

## 2013-02-17 MED ORDER — OXYCODONE-ACETAMINOPHEN 5-325 MG PO TABS: ORAL_TABLET | ORAL | Status: DC

## 2013-02-17 MED ORDER — METRONIDAZOLE 500 MG PO TABS: 500.0000 mg | ORAL_TABLET | Freq: Three times a day (TID) | ORAL | Status: DC

## 2013-02-17 MED ORDER — PENICILLIN V POTASSIUM 500 MG PO TABS: 500.0000 mg | ORAL_TABLET | Freq: Four times a day (QID) | ORAL | Status: DC

## 2013-02-17 MED ORDER — IBUPROFEN 800 MG PO TABS
ORAL_TABLET | ORAL | Status: AC
  Filled 2013-02-17: qty 1

## 2013-02-17 MED ORDER — IBUPROFEN 800 MG PO TABS
800.0000 mg | ORAL_TABLET | Freq: Once | ORAL | Status: AC
  Administered 2013-02-17: 800 mg via ORAL

## 2013-02-17 NOTE — ED Notes (Signed)
States a filling fell out a while back , and tooth started to hurt last PM; NAD

## 2013-02-17 NOTE — ED Notes (Signed)
Copy of [facility] dental schedule given to pt

## 2013-02-17 NOTE — ED Provider Notes (Signed)
Chief Complaint:   Chief Complaint  Patient presents with  . Dental Problem    History of Present Illness:   Douglas Murphy is a 47 year old male who has had a three-day history of severe pain in the left, lower, second molar. A filling fell out of that tooth several months ago. It hasn't bothered him up for the past 3 days. It's for a painful to bite or chew. There's been no purulent drainage, no swelling the gingiva or the cheek. He describes a throbbing pain rated 10 over 10 in intensity. He has had some headache, left ear pain, right jaw pain. He has no difficulty swallowing or breathing.  Review of Systems:  Other than noted above, the patient denies any of the following symptoms: Systemic:  No fever, chills,  Or sweats. ENT:  No headache, ear ache, sore throat, nasal congestion, facial pain, or swelling. Lymphatic:  No adenopathy. Lungs:  No coughing, wheezing or shortness of breath.  PMFSH:  Past medical history, family history, social history, meds, and allergies were reviewed.  Physical Exam:   Vital signs:  BP 142/96  Pulse 83  Temp(Src) 97.5 F (36.4 C) (Oral)  Resp 16  SpO2 97% General:  Alert, oriented, in no distress. ENT:  TMs and canals normal.  Nasal mucosa normal. Mouth exam:  His teeth are in fairly good repair. The left, lower, second molar has a filling missing and the pulp is exposed. It's tender to touch. There is no purulent drainage, swelling of the gingiva, or collection of pus. No swelling of the throat or the floor the mouth of the tongue. The airway is widely patent. Neck:  No swelling or adenopathy. Lungs:  Breath sounds clear and equal bilaterally.  No wheezes, rales or rhonchi. Heart:  Regular rhythm.  No gallops or murmers. Skin:  Clear, warm and dry.   Course in Urgent Care Center:   Given ibuprofen 800 mg by mouth.  Assessment:  The encounter diagnosis was Pulpitis.  No evidence of airway compromise.  Plan:   1.  The following meds were  prescribed:   Discharge Medication List as of 02/17/2013 12:10 PM    START taking these medications   Details  ibuprofen (ADVIL,MOTRIN) 800 MG tablet Take 1 tablet (800 mg total) by mouth 3 (three) times daily., Starting 02/17/2013, Until Discontinued, Normal    metroNIDAZOLE (FLAGYL) 500 MG tablet Take 1 tablet (500 mg total) by mouth 3 (three) times daily., Starting 02/17/2013, Until Discontinued, Normal    oxyCODONE-acetaminophen (PERCOCET) 5-325 MG per tablet 1 to 2 tablets every 6 hours as needed for pain., Print    penicillin v potassium (VEETID) 500 MG tablet Take 1 tablet (500 mg total) by mouth 4 (four) times daily., Starting 02/17/2013, Until Discontinued, Normal       2.  The patient was instructed in symptomatic care and handouts were given.  Suggested sleeping with head of bed elevated and hot salt water mouthwash. 3.  The patient was told to return if becoming worse in any way, if no better in 3 or 4 days, and given some red flag symptoms such as difficulty swallowing or breathing that would indicate earlier return, especially difficulty breathing. 4.  The patient was told to follow up with a dentist as soon as possible.    Reuben Likes, MD 02/17/13 [phone]

## 2014-01-30 ENCOUNTER — Encounter (HOSPITAL_COMMUNITY): Payer: Self-pay | Admitting: Emergency Medicine

## 2014-01-30 ENCOUNTER — Emergency Department (HOSPITAL_COMMUNITY)
Admission: EM | Admit: 2014-01-30 | Discharge: 2014-01-30 | Disposition: A | Discharge status: Home / Self Care | Payer: Self-pay | Attending: Emergency Medicine | Admitting: Emergency Medicine

## 2014-01-30 DIAGNOSIS — Z792 Long term (current) use of antibiotics: Secondary | ICD-10-CM | POA: Insufficient documentation

## 2014-01-30 DIAGNOSIS — Z791 Long term (current) use of non-steroidal anti-inflammatories (NSAID): Secondary | ICD-10-CM | POA: Insufficient documentation

## 2014-01-30 DIAGNOSIS — L0291 Cutaneous abscess, unspecified: Secondary | ICD-10-CM

## 2014-01-30 DIAGNOSIS — IMO0002 Reserved for concepts with insufficient information to code with codable children: Secondary | ICD-10-CM | POA: Insufficient documentation

## 2014-01-30 MED ORDER — IBUPROFEN 200 MG PO TABS
600.0000 mg | ORAL_TABLET | Freq: Once | ORAL | Status: AC
  Administered 2014-01-30: 600 mg via ORAL
  Filled 2014-01-30: qty 3

## 2014-01-30 MED ORDER — CEPHALEXIN 250 MG PO CAPS: 250.0000 mg | ORAL_CAPSULE | Freq: Four times a day (QID) | ORAL | Status: DC

## 2014-01-30 MED ORDER — IBUPROFEN 600 MG PO TABS: 600.0000 mg | ORAL_TABLET | Freq: Three times a day (TID) | ORAL | Status: DC | PRN

## 2014-01-30 MED ORDER — CEPHALEXIN 250 MG PO CAPS
250.0000 mg | ORAL_CAPSULE | Freq: Once | ORAL | Status: AC
  Administered 2014-01-30: 250 mg via ORAL
  Filled 2014-01-30 (×2): qty 1

## 2014-01-30 NOTE — Discharge Instructions (Signed)
Abscess Care After An abscess (also called a boil or furuncle) is an infected area that contains a collection of pus. Signs and symptoms of an abscess include pain, tenderness, redness, or hardness, or you may feel a moveable soft area under your skin. An abscess can occur anywhere in the body. The infection may spread to surrounding tissues causing cellulitis. A cut (incision) by the surgeon was made over your abscess and the pus was drained out. Gauze may have been packed into the space to provide a drain that will allow the cavity to heal from the inside outwards. The boil may be painful for 5 to 7 days. Most people with a boil do not have high fevers. Your abscess, if seen early, may not have localized, and may not have been lanced. If not, another appointment may be required for this if it does not get better on its own or with medications. HOME CARE INSTRUCTIONS   Only take over-the-counter or prescription medicines for pain, discomfort, or fever as directed by your caregiver.  When you bathe, soak and then remove gauze or iodoform packs at least daily or as directed by your caregiver. You may then wash the wound gently with mild soapy water. Repack with gauze or do as your caregiver directs. SEEK IMMEDIATE MEDICAL CARE IF:   You develop increased pain, swelling, redness, drainage, or bleeding in the wound site.  You develop signs of generalized infection including muscle aches, chills, fever, or a general ill feeling.  An oral temperature above 102 F (38.9 C) develops, not controlled by medication. See your caregiver for a recheck if you develop any of the symptoms described above. If medications (antibiotics) were prescribed, take them as directed. Document Released: 03/18/2005 Document Revised: 11/22/2011 Document Reviewed: 11/13/2007 [facility] Patient Information 2014 [place], [place]. As discussed, warm compresses, to the area several times a day for the next one to 2 days

## 2014-01-30 NOTE — ED Provider Notes (Signed)
CSN: [id number]     Arrival date & time 01/30/14  2127 History   This chart was scribed for non-physician practitioner [name], NP, working with Layla MawKristen N Ward, DO, by Yevette EdwardsAngela Bracken, ED Scribe. This patient was seen in room WTR8/WTR8 and the patient's care was started at 10:09 PM.  None    Chief Complaint  Patient presents with  . Abscess    The history is provided by the patient. No language interpreter was used.   HPI Comments: [name] is a 48 y.o. male who presents to the Emergency Department complaining of abscesses to his axilla bilaterally which have been present for three days. He has two abscesses to his left axilla and one to his right. The pt states he has attempted to drain the abscesses without resolution. He denies a prior history of abscesses.   History reviewed. No pertinent past medical history. Past Surgical History  Procedure Laterality Date  . Knee arthroscopy     No family history on file. History  Substance Use Topics  . Smoking status: Never Smoker   . Smokeless tobacco: Never Used  . Alcohol Use: No    Review of Systems  Constitutional: Negative for fever.  Skin: Positive for color change.  All other systems reviewed and are negative.     Allergies  Review of patient's allergies indicates no known allergies.  Home Medications   Prior to Admission medications   Medication Sig Start Date End Date Taking? Authorizing Provider  ibuprofen (ADVIL,MOTRIN) 800 MG tablet Take 1 tablet (800 mg total) by mouth 3 (three) times daily. 02/17/13   Reuben Likesavid C Keller, MD  metroNIDAZOLE (FLAGYL) 500 MG tablet Take 1 tablet (500 mg total) by mouth 3 (three) times daily. 02/17/13   Reuben Likesavid C Keller, MD  oxyCODONE-acetaminophen (PERCOCET) 5-325 MG per tablet 1 to 2 tablets every 6 hours as needed for pain. 02/17/13   Reuben Likesavid C Keller, MD  penicillin v potassium (VEETID) 500 MG tablet Take 1 tablet (500 mg total) by mouth 4 (four) times daily. 02/17/13   Reuben Likesavid C  Keller, MD   Triage Vitals: BP 124/80  Pulse 107  Temp(Src) 98.1 F (36.7 C) (Oral)  Resp 20  SpO2 97%  Physical Exam  Nursing note and vitals reviewed. Constitutional: He is oriented to person, place, and time. He appears well-developed and well-nourished. No distress.  HENT:  Head: Normocephalic and atraumatic.  Eyes: EOM are normal.  Neck: Neck supple. No tracheal deviation present.  Cardiovascular: Normal rate.   Pulmonary/Chest: Effort normal. No respiratory distress.  Musculoskeletal: Normal range of motion.  Neurological: He is alert and oriented to person, place, and time.  Skin: Skin is warm and dry. There is erythema.  0.5 cm round abscess to left axilla.   Psychiatric: He has a normal mood and affect. His behavior is normal.    ED Course  Procedures (including critical care time)  DIAGNOSTIC STUDIES: Oxygen Saturation is 97% on room air, normal by my interpretation.    COORDINATION OF CARE:  10:11 PM- Discussed treatment plan with patient, and the patient agreed to the plan. The plan includes incision and drainage of the abscess to the left axilla as well as an antibiotic.  10:13 PM- INCISION AND DRAINAGE PROCEDURE NOTE: Patient identification was confirmed and verbal consent was obtained. This procedure was performed by Earley FavorGail Keeghan Bialy, NP,  at 10:13 PM. Site: Left axilla Sterile procedures observed Needle size: 22 gauge Anesthetic used (type and amt): 1% without epi;  0.5 cc used Blade size: 11 Drainage: minimal Complexity: Complex Packing not used Site anesthetized, incision made over site, wound drained and explored loculations, rinsed with copious amounts of normal saline, wound packed with sterile gauze, covered with dry, sterile dressing.  Pt tolerated procedure well without complications.  Instructions for care discussed verbally and pt provided with additional written instructions for homecare and f/u.   Labs Review Labs Reviewed - No data to  display  Imaging Review No results found.   EKG Interpretation None      MDM  Patient has been started on Keflex DT the number of small abscesses in both axilla.  The larger one in the left has been drained with minimal drainage.  Patient has been instructed to place a warm compress to 3 times a day.  He can take the antibiotic as directed until, completed Final diagnoses:  None       I personally performed the services described in this documentation, which was scribed in my presence. The recorded information has been reviewed and is accurate.    Arman FilterGail K Phu Record, NP 01/30/14 2233

## 2014-01-30 NOTE — ED Notes (Signed)
Per pt report: pt reports having 2 abscess.  One under each arm.  Abscess are red and tender to the touch.  Pt a/o x 4.  Ambulatory.

## 2014-01-30 NOTE — ED Provider Notes (Signed)
Medical screening examination/treatment/procedure(s) were performed by non-physician practitioner and as supervising physician I was immediately available for consultation/collaboration.   EKG Interpretation None        Sommer Spickard N Cordella Nyquist, DO 01/30/14 2326 

## 2014-10-15 ENCOUNTER — Emergency Department (HOSPITAL_COMMUNITY): Payer: Self-pay

## 2014-10-15 ENCOUNTER — Encounter (HOSPITAL_COMMUNITY): Payer: Self-pay | Admitting: Physical Medicine and Rehabilitation

## 2014-10-15 ENCOUNTER — Emergency Department (HOSPITAL_COMMUNITY)
Admission: EM | Admit: 2014-10-15 | Discharge: 2014-10-15 | Disposition: A | Discharge status: Home / Self Care | Payer: Self-pay | Attending: Emergency Medicine | Admitting: Emergency Medicine

## 2014-10-15 DIAGNOSIS — Z792 Long term (current) use of antibiotics: Secondary | ICD-10-CM | POA: Insufficient documentation

## 2014-10-15 DIAGNOSIS — M5412 Radiculopathy, cervical region: Secondary | ICD-10-CM | POA: Insufficient documentation

## 2014-10-15 DIAGNOSIS — Z791 Long term (current) use of non-steroidal anti-inflammatories (NSAID): Secondary | ICD-10-CM | POA: Insufficient documentation

## 2014-10-15 MED ORDER — PREDNISONE 20 MG PO TABS: ORAL_TABLET | ORAL | Status: DC

## 2014-10-15 MED ORDER — HYDROCODONE-ACETAMINOPHEN 5-325 MG PO TABS: 1.0000 | ORAL_TABLET | Freq: Four times a day (QID) | ORAL | Status: DC | PRN

## 2014-10-15 NOTE — Discharge Instructions (Signed)
Please call your doctor for a followup appointment within 24-48 hours. When you talk to your doctor please let them know that you were seen in the emergency department and have them acquire all of your records so that they can discuss the findings with you and formulate a treatment plan to fully care for your new and ongoing problems. Please rest and stay hydrated Please follow-up with health and wellness Center Please follow-up with orthopedics Arthritis in your neck - may need MRI in the future Please avoid any physical or strenuous activity Please apply warm compressions and massage Please take medications as prescribed - while on pain medications there is to be no drinking alcohol, driving, operating any heavy machinery. If extra please dispose in a proper manner. Please do not take any extra Tylenol with this medication for this can lead to Tylenol overdose and liver issues.  Please continue to monitor symptoms closely and if symptoms are to worsen or change (fever greater than 101, chills, sweating, nausea, vomiting, chest pain, shortness of breathe, difficulty breathing, weakness, numbness, tingling, worsening or changes to pain pattern, fall injury, neck swelling, blurred vision, sudden loss of vision, dizziness) please report back to the Emergency Department immediately.    Cervical Radiculopathy Cervical radiculopathy happens when a nerve in the neck is pinched or bruised by a slipped (herniated) disk or by arthritic changes in the bones of the cervical spine. This can occur due to an injury or as part of the normal aging process. Pressure on the cervical nerves can cause pain or numbness that runs from your neck all the way down into your arm and fingers. CAUSES  There are many possible causes, including:  Injury.  Muscle tightness in the neck from overuse.  Swollen, painful joints (arthritis).  Breakdown or degeneration in the bones and joints of the spine (spondylosis) due to  aging.  Bone spurs that may develop near the cervical nerves. SYMPTOMS  Symptoms include pain, weakness, or numbness in the affected arm and hand. Pain can be severe or irritating. Symptoms may be worse when extending or turning the neck. DIAGNOSIS  Your caregiver will ask about your symptoms and do a physical exam. He or she may test your strength and reflexes. X-rays, CT scans, and MRI scans may be needed in cases of injury or if the symptoms do not go away after a period of time. Electromyography (EMG) or nerve conduction testing may be done to study how your nerves and muscles are working. TREATMENT  Your caregiver may recommend certain exercises to help relieve your symptoms. Cervical radiculopathy can, and often does, get better with time and treatment. If your problems continue, treatment options may include:  Wearing a soft collar for short periods of time.  Physical therapy to strengthen the neck muscles.  Medicines, such as nonsteroidal anti-inflammatory drugs (NSAIDs), oral corticosteroids, or spinal injections.  Surgery. Different types of surgery may be done depending on the cause of your problems. HOME CARE INSTRUCTIONS   Put ice on the affected area.  Put ice in a plastic bag.  Place a towel between your skin and the bag.  Leave the ice on for 15-20 minutes, 03-04 times a day or as directed by your caregiver.  If ice does not help, you can try using heat. Take a warm shower or bath, or use a hot water bottle as directed by your caregiver.  You may try a gentle neck and shoulder massage.  Use a flat pillow when you sleep.  Only take over-the-counter or prescription medicines for pain, discomfort, or fever as directed by your caregiver.  If physical therapy was prescribed, follow your caregiver's directions.  If a soft collar was prescribed, use it as directed. SEEK IMMEDIATE MEDICAL CARE IF:   Your pain gets much worse and cannot be controlled with  medicines.  You have weakness or numbness in your hand, arm, face, or leg.  You have a high fever or a stiff, rigid neck.  You lose bowel or bladder control (incontinence).  You have trouble with walking, balance, or speaking. MAKE SURE YOU:   Understand these instructions.  Will watch your condition.  Will get help right away if you are not doing well or get worse. Document Released: 05/25/2001 Document Revised: 11/22/2011 Document Reviewed: 04/13/2011 [facility] Patient Information 2015 [place], [place]. This information is not intended to replace advice given to you by your health care provider. Make sure you discuss any questions you have with your health care provider.   Emergency Department Resource Guide 1) Find a Doctor and Pay Out of Pocket Although you won't have to find out who is covered by your insurance plan, it is a good idea to ask around and get recommendations. You will then need to call the office and see if the doctor you have chosen will accept you as a new patient and what types of options they offer for patients who are self-pay. Some doctors offer discounts or will set up payment plans for their patients who do not have insurance, but you will need to ask so you aren't surprised when you get to your appointment.  2) Contact Your Local Health Department Not all health departments have doctors that can see patients for sick visits, but many do, so it is worth a call to see if yours does. If you don't know where your local health department is, you can check in your phone book. The CDC also has a tool to help you locate your state's health department, and many state websites also have listings of all of their local health departments.  3) Find a Walk-in Clinic If your illness is not likely to be very severe or complicated, you may want to try a walk in clinic. These are popping up all over the country in pharmacies, drugstores, and shopping centers. They're usually  staffed by nurse practitioners or physician assistants that have been trained to treat common illnesses and complaints. They're usually fairly quick and inexpensive. However, if you have serious medical issues or chronic medical problems, these are probably not your best option.  No Primary Care Doctor: - Call Health Connect at  [phone] - they can help you locate a primary care doctor that  accepts your insurance, provides certain services, etc. - Physician Referral Service- [phone]  Chronic Pain Problems: Organization         Address  Phone   Notes  [facility]  [phone] Patients need to be referred by their primary care doctor.   Medication Assistance: Organization         Address  Phone   Notes  [facility] Medication [facility] [address] [place] [place], [place] [address] [phone] --Must be a resident of [facility] -- Must have NO insurance coverage whatsoever (no Medicaid/ Medicare, etc.) -- The pt. MUST have a primary care doctor that directs their care regularly and follows them in the community   Med[facility]ist  [phone]   [address]  (  [phone]    Agencies that provide inexpensive medical care: Organization         Address  Phone   Notes  [facility]  [phone]   [facility]    [phone]   [facility] [address] [place], [place] [address] [phone]   [facility] [address] [place] [phone]   Planned Parenthood    [phone]   [facility]    [phone]   [facility]  [address], [place] Phone:  [phone], Fax:  [phone] Hours of Operation:  9 am - 6 pm, M-F.  Also accepts Medicaid/Medicare and self-pay.  [facility]  [address], [place] Phone: [phone], Fax: [phone]. Hours of  Operation:  8:30 am - 5:30 pm, M-F.  Also accepts Medicaid and self-pay.  [facility] [place] [address], [place] Phone: [phone]   [facility] [address] Natasha Bence [place], [place] [phone], Ext. [phone] Mondays & Thursdays: 7-9 AM.  First 15 patients are seen on a first come, first serve basis.    Medicaid-accepting [facility] Providers:  Organization         Address  Phone   Notes  [facility] [address], [place] [phone] Also accepts self-pay patients.  [facility] [address] Laurell Josephs [place], [place]  [phone]   [facility] [address], [place] [phone]   [facility] [address] [place] [phone]   Renaye Rakers [address], [place]   [phone] Only accepts [place] Access [place] patients after they have their name applied to their card.   Self-Pay (no insurance) in [facility]  Organization         Address  Phone   Notes  Sickle Cell Patients, [facility] [address] [place], [place] [phone]   [facility] [address] [place], [place] [phone]   [facility] [place]  [address], [place] [phone]   [facility]/Dr. Osei-Bonsu  [address], [place] or [address], [place] [phone] Phone number for both [place] and [place] locations is the same.  [facility]gent Medical and [facility] [address] [place] [phone]   [facility] [address] [place] or [address] [phone]   [facility] [address] [place] [phone], phone; [phone], fax Sees patients 1st and 3rd Saturday of every month.  Must not qualify for public or private insurance (i.e. Medicaid, Medicare,  [place] Health Choice, Veterans' Benefits)  Household income should be no more than 200% of the poverty level The clinic cannot treat you if you are pregnant or think you are pregnant  Sexually transmitted diseases are not treated at the clinic.    Dental Care: Organization         Address  Phone  Notes  [facility] [address] [place], [place] [phone] Accepts children up to age 98 who are enrolled in [place] or [place] Health Choice; pregnant women with a Medicaid card; and children who  have applied for Medicaid or [place] Health Choice, but were declined, whose parents can pay a reduced fee at time of service.  [facility] [place]  [address], [place] [phone] Accepts children up to age 47 who are enrolled in [place] or [place] Health Choice; pregnant women with a Medicaid card; and children who have applied for Medicaid or [place] Health Choice, but were declined, whose parents can pay a reduced fee at time of service.  [facility]  [address], [place] [phone] Patients are seen by appointment only. Walk-ins are not accepted. [facility] will see patients 47 years of age and older. Monday - Tuesday (8am-5pm) Most Wednesdays (8:30-5pm) $30 per visit, cash only  [facility]  [address], [facility] [place] [phone] Patients are seen by appointment only. Walk-ins are not accepted. [facility] will see patients 47 years of age and older. One Wednesday Evening (Monthly: Volunteer Based).  $30 per visit, cash only  [facility]  [phone] for adults; Children under age 24, call Graduate Pediatric Dentistry at [phone]) [phone]. Children aged 694-14, please call [phone] to request a pediatric application.  Dental services are provided in all areas of dental care including fillings, crowns and bridges,  complete and partial dentures, implants, gum treatment, root canals, and extractions. Preventive care is also provided. Treatment is provided to both adults and children. Patients are selected via a lottery and there is often a waiting list.   [facility] [address], [place]  [phone] [email or web address]   [facility] [address], [place], [place] [phone], Ext. [phone] Second and Fourth Thursday of each month, opens at 6:30 AM; Clinic ends at 9 AM.  Patients are seen on a first-come first-served basis, and a limited number are seen during each clinic.   [facility]  [address], [place], [place] [phone]) [phone]   Eligibility Requirements You must have lived in [place], [place], or [place] counties for at least the last three months.   You cannot be eligible for state or federal sponsored National [place]healthcare insurance, including CIGNAVeterans Administration, [place]icaid, or [employer]icare.   You generally cannot be eligible for healthcare insurance through your employer.    How to apply: Eligibility screenings are held every Tuesday and Wednesday afternoon from 1:00 pm until 4:00 pm. You do not need an appointment for the interview!  [facility] [address], [place], [place] [phone]   [facility]  [phone]   [facility]  [phone]   [facility]  [phone]    Behavioral Health Resources in the Community: Intensive Outpatient Programs Organization         Address  Phone  Notes  [facility] [address], [place], [place] [phone]   [facility] [address], [place], [place] [phone]   [facility] [address], [place], [place]  [phone]   [facility] [address],  [place], [place] [phone] or [phone]   Substance Abuse Resources Organization          Address  Phone  Notes  [facility]  [phone]   [facility]  [phone]   [facility]  [phone]   [facility]  [phone]   Residential & Outpatient  Substance Abuse Program  [phone]   Psychological Services Organization         Address  Phone  Notes  [facility]  [phone]   [facility]  [phone]   [facility] [phone] [place] [phone] or [phone]    Mobile Crisis Teams Organization         Address  Phone  Notes  [facility], Mobile Crisis Care Unit  [phone]   [facility]  [address] [place], [place] [phone]   Doristine Locks [address] [place] [phone]    Self-Help/Support Groups Organization         Address  Phone             Notes  [facility] [place] - variety of support groups  [phone]- I7437963 Call for more information  Narcotics Anonymous (NA), [facility] [address], [employer] [place]  2 meetings at this location   Statistician         Address  Phone  Notes  [facility] [address],    [place]  [phone]   [facility]  [address], [place] [address], [place], [place] [phone]   [facility] [address] [place], [place] [phone] Admissions: 8am-3pm M-F  [facility] [address],    [place], [place] [phone]   [facility] [address] [place], [place], [place] [phone]   [facility] [address],  [place], [place] [phone]   [facility] - Intensive Outpatient [address] Laurell Josephs [address], [place], [place] [phone]   [facility] ([facility] [address] [place]  [place], [place] [phone] or [phone]   [facility] ([facility]) [address], [place], [place]  [phone] Accepts Medicaid  [facility] [address],  [place] [phone] Substance Abuse/Addiction Treatment   [facility] Organization         Address  Phone  Notes  [facility]  [phone]   Angie Fava, PhD [address] Ervin Knack [place], [place]   [phone] or [phone]   [facility]al   [address] [place], [place] [phone]   [facility] [address] [place], [place] [phone] Insurance/Medicaid/sponsorship through [facility] [address]                                    [place], [place] [phone] Therapy/tele-psych/case  [facility] [address], [place] [phone]    Dr. Lolly Mustache  [phone]   [facility] [place]  [employer] [facility] Dept. 1) [address] [place] 2) [address], [place] 3)  [address], [place] [phone]  [phone]   [facility] Child Abuse Hotline 847-211-6134 or [phone] (After Hours)

## 2014-10-15 NOTE — ED Provider Notes (Signed)
CSN: [id number]     Arrival date & time 10/15/14  1058 History   First MD Initiated Contact with Patient 10/15/14 1146     Chief Complaint  Patient presents with  . Numbness     (Consider location/radiation/quality/duration/timing/severity/associated sxs/prior Treatment) The history is provided by the patient. No language interpreter was used.  Brelan Hannen is a 49 year old male with no significant past history presenting to the ED with numbness and tingling to the left arm and right shoulder does been ongoing intermittently for the past 2 weeks, but has been consistent for the past couple of days. Patient reported that he does experience some mild neck discomfort described as a shooting pain that is intermittent that radiates down to the middle of his back and radiates down to his left shoulder. Stated that at rest there is a dull aching pain to his neck and left shoulder. Stated that the symptoms of his neck have been ongoing for approximately a month and half. Patient reported that he's been using ibuprofen with minimal relief. 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 Reported that he is able to lift objects, but at times has intermittent numbness to the left hand and right arm. Denied fall, injury, fever, chills, chest pain, shortness of breath, difficulty breathing, weakness, blurred vision, sudden loss of vision, nausea, vomiting, IV drug abuse, weight loss, history of cancer. PCP none  History reviewed. No pertinent past medical history. Past Surgical History  Procedure Laterality Date  . Knee arthroscopy     History reviewed. No pertinent family history. History  Substance Use Topics  . Smoking status: Never Smoker   . Smokeless tobacco: Never Used  . Alcohol Use: No    Review of Systems  Constitutional: Negative for fever and chills.   Respiratory: Negative for chest tightness and shortness of breath.   Cardiovascular: Negative for chest pain.  Gastrointestinal: Negative for nausea, vomiting and abdominal pain.  Musculoskeletal: Positive for neck pain. Negative for back pain and neck stiffness.  Neurological: Positive for numbness. Negative for dizziness, weakness and headaches.      Allergies  Review of patient's allergies indicates no known allergies.  Home Medications   Prior to Admission medications   Medication Sig Start Date End Date Taking? Authorizing Provider  cephALEXin (KEFLEX) 250 MG capsule Take 1 capsule (250 mg total) by mouth 4 (four) times daily. 01/30/14   Arman Filter, NP  HYDROcodone-acetaminophen (NORCO/VICODIN) 5-325 MG per tablet Take 1 tablet by mouth every 6 (six) hours as needed for moderate pain or severe pain. 10/15/14   Lavanya Roa, PA-C  ibuprofen (ADVIL,MOTRIN) 600 MG tablet Take 1 tablet (600 mg total) by mouth every 8 (eight) hours as needed. 01/30/14   Arman Filter, NP  ibuprofen (ADVIL,MOTRIN) 800 MG tablet Take 1 tablet (800 mg total) by mouth 3 (three) times daily. 02/17/13   Reuben Likes, MD  metroNIDAZOLE (FLAGYL) 500 MG tablet Take 1 tablet (500 mg total) by mouth 3 (three) times daily. 02/17/13   Reuben Likes, MD  oxyCODONE-acetaminophen (PERCOCET) 5-325 MG per tablet 1 to 2 tablets every 6 hours as needed for pain. 02/17/13   Reuben Likes, MD  penicillin v potassium (VEETID) 500 MG tablet Take 1 tablet (500 mg total) by mouth 4 (four) times daily. 02/17/13   Reuben Likes, MD  predniSONE (DELTASONE) 20 MG  tablet 3 tabs po day one, then 2 tabs daily x 4 days 10/15/14   Frances Ambrosino, PA-C   BP 117/87 mmHg  Pulse 67  Temp(Src) 97.8 F (36.6 C) (Oral)  Resp 12  SpO2 100% Physical Exam  Constitutional: He is oriented to person, place, and time. He appears well-developed and well-nourished. No distress.  HENT:  Head: Normocephalic and atraumatic.  Eyes: Conjunctivae and EOM  are normal. Pupils are equal, round, and reactive to light. Right eye exhibits no discharge. Left eye exhibits no discharge.  Neck: Normal range of motion and full passive range of motion without pain. Neck supple. Muscular tenderness present. No spinous process tenderness present. No rigidity. No edema, no erythema and normal range of motion present.  Mild discomfort upon palpation to the musculature of the neck and trapezius muscles bilaterally-tense muscles palpated.  Cardiovascular: Normal rate, regular rhythm and normal heart sounds.   Pulses:      Radial pulses are 2+ on the right side, and 2+ on the left side.  Cap refill < 3 seconds  Pulmonary/Chest: Effort normal and breath sounds normal. No respiratory distress. He has no wheezes. He has no rales.  Musculoskeletal: Normal range of motion.  Neurological: He is alert and oriented to person, place, and time. No cranial nerve deficit. He exhibits normal muscle tone. Coordination normal.  Strength 5+/5+ to upper and lower extremities bilaterally with resistance applied, equal distribution noted Equal grip strength bilaterally Interphalangeal strength intact  Strength intact to MCP, PIP, DIP joints of bilateral hands Sensation intact with differentiation to sharp and dull touch - difficulty with differentiation to the C7, C8 dermatomal distribution of the left upper extremity-patient is able to have the sensation, but is unable to differentiate between sharp and dull Negative arm drift Negative drop arm  Skin: Skin is warm and dry. No rash noted. He is not diaphoretic. No erythema.  Psychiatric: He has a normal mood and affect. His behavior is normal. Thought content normal.  Nursing note and vitals reviewed.   ED Course  Procedures (including critical care time) Labs Review Labs Reviewed - No data to display  Imaging Review Dg Cervical Spine Complete  10/15/2014   CLINICAL DATA:  Lifting injury. Initial encounter. Numbness in the  shoulders and LEFT hand.  EXAM: CERVICAL SPINE  4+ VIEWS  COMPARISON:  None.  FINDINGS: Anatomic alignment of the cervical spine. Anterior longitudinal ligament calcification and annular calcification is present. The intervertebral disc spaces are preserved. Vertebral body height is preserved. There is mild bony foraminal stenosis on the RIGHT at C4-C5. Mild bony foraminal stenosis is present on the LEFT at C5-C6. Odontoid is intact.  IMPRESSION: No acute osseous abnormality. Mild bony foraminal stenosis on the RIGHT at C4-C5 secondary to uncovertebral spurring and facet arthrosis. Similar changes but less pronounced on the LEFT at C5-C6.   Electronically Signed   By: Andreas Newport M.D.   On: 10/15/2014 13:22     EKG Interpretation None      12:31 PM This provider discussed case in great detail with attending physician, Dr. Haywood Lasso. As per physician, recommended C-spine plain film.  MDM   Final diagnoses:  Cervical radiculopathy    Medications - No data to display  Filed Vitals:   10/15/14 1351 10/15/14 1400 10/15/14 1430 10/15/14 1500  BP: 126/89 120/78 123/83 117/87  Pulse: 80 76 72 67  Temp:      TempSrc:      Resp: 12  SpO2: 100% 100% 100% 100%   Plain film of cervical spine no acute osseous abnormality noted. Mild bony for mental stenosis on the right at C4-C5 secondary to under go vertebral spurring and facet arthrosis. Similar changes but less pronounced on the left at C5-C6. Strength intact with equal distribution. Full range of motion to the upper extremities without difficulty or ataxia. Difficulty differentiating between soft and sharp noted at C6 and C7 of the left upper extremity - patient is able to feel but is unable to differentiate. Interphalangeal strength intact. Doubt epidural abscess. Doubt meningitis. Suspicion to be cervical radiculopathy. Patient has history of heavy lifting with his job. Plain film of cervical spine identified spurring and arthrosis.  Strength intact. Pulses palpable and strong. Patient stable, afebrile. Patient not septic appearing. Discharged patient. Discharge patient with steroids and pain medication-discussed course, precautions, disposal technique. [facility] and [facility]. Discussed with patient that he may need to get an MRI in the future. Discussed with patient to rest and stay hydrated. Discussed with patient to try and avoid any physical or strenuous activity. Discussed with patient to closely monitor symptoms and if symptoms are to worsen or change to report back to the ED - strict return instructions given.  Patient agreed to plan of care, understood, all questions answered.     Raymon MuttonMarissa Tionne Dayhoff, PA-C 10/15/14 1515  Lyanne CoKevin M Campos, MD 10/15/14 [phone]

## 2014-10-15 NOTE — ED Notes (Signed)
Pt presents to department for evaluation of intermittent L shoulder numbness radiating to both arms and back. Also reports difficulty gripping objects. Denies pain at the time. Strong equal bilateral grip strengths at present. No neurological deficits.

## 2014-10-15 NOTE — ED Notes (Signed)
Pt states he is feeling better at this time.

## 2014-11-12 DIAGNOSIS — S82401A Unspecified fracture of shaft of right fibula, initial encounter for closed fracture: Secondary | ICD-10-CM

## 2014-11-12 HISTORY — DX: Unspecified fracture of shaft of right fibula, initial encounter for closed fracture: S82.401A

## 2014-11-26 ENCOUNTER — Emergency Department (HOSPITAL_COMMUNITY): Payer: Worker's Compensation

## 2014-11-26 ENCOUNTER — Emergency Department (HOSPITAL_COMMUNITY)
Admission: EM | Admit: 2014-11-26 | Discharge: 2014-11-26 | Disposition: A | Discharge status: Home / Self Care | Payer: Worker's Compensation | Attending: Emergency Medicine | Admitting: Emergency Medicine

## 2014-11-26 ENCOUNTER — Encounter (HOSPITAL_COMMUNITY): Payer: Self-pay | Admitting: *Deleted

## 2014-11-26 DIAGNOSIS — Z7952 Long term (current) use of systemic steroids: Secondary | ICD-10-CM | POA: Diagnosis not present

## 2014-11-26 DIAGNOSIS — Y9389 Activity, other specified: Secondary | ICD-10-CM | POA: Insufficient documentation

## 2014-11-26 DIAGNOSIS — Y990 Civilian activity done for income or pay: Secondary | ICD-10-CM | POA: Insufficient documentation

## 2014-11-26 DIAGNOSIS — Y9289 Other specified places as the place of occurrence of the external cause: Secondary | ICD-10-CM | POA: Insufficient documentation

## 2014-11-26 DIAGNOSIS — W208XXA Other cause of strike by thrown, projected or falling object, initial encounter: Secondary | ICD-10-CM | POA: Insufficient documentation

## 2014-11-26 DIAGNOSIS — Z792 Long term (current) use of antibiotics: Secondary | ICD-10-CM | POA: Diagnosis not present

## 2014-11-26 DIAGNOSIS — Z791 Long term (current) use of non-steroidal anti-inflammatories (NSAID): Secondary | ICD-10-CM | POA: Diagnosis not present

## 2014-11-26 DIAGNOSIS — S82831A Other fracture of upper and lower end of right fibula, initial encounter for closed fracture: Secondary | ICD-10-CM | POA: Diagnosis not present

## 2014-11-26 DIAGNOSIS — S8991XA Unspecified injury of right lower leg, initial encounter: Secondary | ICD-10-CM | POA: Diagnosis present

## 2014-11-26 DIAGNOSIS — S82401A Unspecified fracture of shaft of right fibula, initial encounter for closed fracture: Secondary | ICD-10-CM

## 2014-11-26 MED ORDER — HYDROCODONE-ACETAMINOPHEN 5-325 MG PO TABS: 1.0000 | ORAL_TABLET | Freq: Four times a day (QID) | ORAL | Status: DC | PRN

## 2014-11-26 MED ORDER — IBUPROFEN 800 MG PO TABS: 800.0000 mg | ORAL_TABLET | Freq: Three times a day (TID) | ORAL | Status: DC

## 2014-11-26 NOTE — ED Notes (Signed)
Pt states that he was moving a washing machine up stairs when it hit him knee. Pt states that he is having pain with movement no deformity noted.

## 2014-11-26 NOTE — ED Notes (Signed)
Patient transported to X-ray 

## 2014-11-26 NOTE — ED Provider Notes (Signed)
CSN: [id number]     Arrival date & time 11/26/14  1551 History  This chart was scribed for Ladona MowJoe Dayan Desa, PA-C working with Jerelyn ScottMartha Linker, MD by Evon Slackerrance Branch, ED Scribe. This patient was seen in room TR06C/TR06C and the patient's care was started at 4:32 PM.      Chief Complaint  Patient presents with  . Knee Injury   The history is provided by the patient. No language interpreter was used.   HPI Comments: Douglas Murphy is a 49 y.o. male who presents to the Emergency Department complaining of new mild right knee pain onset today at 2 PM. Pt states that he has associated swelling. Pt states that today at work a washing machine fell onto his knee. Pt states that the pain is worse with movement. Pt denies gait problem or other related problems.   History reviewed. No pertinent past medical history. Past Surgical History  Procedure Laterality Date  . Knee arthroscopy     No family history on file. History  Substance Use Topics  . Smoking status: Never Smoker   . Smokeless tobacco: Never Used  . Alcohol Use: No    Review of Systems  Musculoskeletal: Positive for joint swelling and arthralgias. Negative for gait problem.  Neurological: Negative for weakness and numbness.    Allergies  Review of patient's allergies indicates no known allergies.  Home Medications   Prior to Admission medications   Medication Sig Start Date End Date Taking? Authorizing Provider  cephALEXin (KEFLEX) 250 MG capsule Take 1 capsule (250 mg total) by mouth 4 (four) times daily. 01/30/14   Earley FavorGail Schulz, NP  HYDROcodone-acetaminophen (NORCO/VICODIN) 5-325 MG per tablet Take 1 tablet by mouth every 6 (six) hours as needed for moderate pain or severe pain. 10/15/14   Marissa Sciacca, PA-C  HYDROcodone-acetaminophen (NORCO/VICODIN) 5-325 MG per tablet Take 1-2 tablets by mouth every 6 (six) hours as needed. 11/26/14   Ladona MowJoe Bostyn Bogie, PA-C  ibuprofen (ADVIL,MOTRIN) 600 MG tablet Take 1 tablet (600 mg total) by mouth  every 8 (eight) hours as needed. 01/30/14   Earley FavorGail Schulz, NP  ibuprofen (ADVIL,MOTRIN) 800 MG tablet Take 1 tablet (800 mg total) by mouth 3 (three) times daily. 02/17/13   Reuben Likesavid C Keller, MD  ibuprofen (ADVIL,MOTRIN) 800 MG tablet Take 1 tablet (800 mg total) by mouth 3 (three) times daily. 11/26/14   Ladona MowJoe Amzie Sillas, PA-C  metroNIDAZOLE (FLAGYL) 500 MG tablet Take 1 tablet (500 mg total) by mouth 3 (three) times daily. 02/17/13   Reuben Likesavid C Keller, MD  oxyCODONE-acetaminophen (PERCOCET) 5-325 MG per tablet 1 to 2 tablets every 6 hours as needed for pain. 02/17/13   Reuben Likesavid C Keller, MD  penicillin v potassium (VEETID) 500 MG tablet Take 1 tablet (500 mg total) by mouth 4 (four) times daily. 02/17/13   Reuben Likesavid C Keller, MD  predniSONE (DELTASONE) 20 MG tablet 3 tabs po day one, then 2 tabs daily x 4 days 10/15/14   Marissa Sciacca, PA-C   BP 134/87 mmHg  Pulse 77  Temp(Src) 97.5 F (36.4 C) (Oral)  Resp 15  SpO2 100%   Physical Exam  Constitutional: He is oriented to person, place, and time. He appears well-developed and well-nourished. No distress.  HENT:  Head: Normocephalic and atraumatic.  Eyes: Conjunctivae and EOM are normal.  Neck: Neck supple. No tracheal deviation present.  Cardiovascular: Normal rate.   Pulmonary/Chest: Effort normal. No respiratory distress.  Musculoskeletal: Normal range of motion.  Right knee: mild effusion, no erythema or  warmth. No anterior or posterior instability. No medial or lateral instability. Tenderness noted over the medial compartment of knee. Full active and passive range of motion with mild pain to extension of knee.  Neurological: He is alert and oriented to person, place, and time.  Skin: Skin is warm and dry.  Psychiatric: He has a normal mood and affect. His behavior is normal.  Nursing note and vitals reviewed.   ED Course  Procedures (including critical care time) DIAGNOSTIC STUDIES: Oxygen Saturation is 97% on RA, normal by my interpretation.     COORDINATION OF CARE: 4:42 PM-Discussed treatment plan with pt at bedside and pt agreed to plan.     Labs Review Labs Reviewed - No data to display  Imaging Review Dg Knee Complete 4 Views Right  11/26/2014   CLINICAL DATA:  complaining of new mild right knee pain onset today at 2 PM. Pt states that he has associated swelling. Pt states that today at work a washing machine fell onto his knee. Pt states that the pain is worse with movement.  EXAM: RIGHT KNEE - COMPLETE 4+ VIEW  COMPARISON:  09/04/2007  FINDINGS: On the lateral view, there is a possible step-off along the anterior cortex of the proximal fibular metaphysis. Remainder of the exam is within normal.  IMPRESSION: Possible nondisplaced fracture along the anterior cortex of the proximal fibular metaphysis.   Electronically Signed   By: Elberta Fortis M.D.   On: 11/26/2014 17:11     EKG Interpretation None      MDM   Final diagnoses:  Closed fibular fracture, right, initial encounter   Patient here with knee injury to R knee.  Exam with pain noted over her medial joint line. No obvious deformity. Radiographs remarkable for a possible nondisplaced fracture along the anterior cortex of the proximal fibular metaphysis. This is not correlate with patient's exam, however given patient's recent injury, we'll place patient in knee immobilizer and have him follow-up with orthopedics. Patient is neurovascularly intact. Discussed return precautions with patient, and patient verbalizes understanding and agreement with this plan. I encouraged patient to call or return to ER should he have any questions or concerns.  I personally performed the services described in this documentation, which was scribed in my presence. The recorded information has been reviewed and is accurate.  BP 134/87 mmHg  Pulse 77  Temp(Src) 97.5 F (36.4 C) (Oral)  Resp 15  SpO2 100%  Signed,  Ladona Mow, PA-C 6:13 PM      Ladona Mow, PA-C 11/26/14  1813  Jerelyn Scott, MD 11/26/14 [phone]

## 2014-11-26 NOTE — Discharge Instructions (Signed)
Tibial and Fibular Fracture, Adult Tibial and fibular fracture is a break in the bones of your lower leg (tibia and fibula). The tibia is the larger of these two bones. The fibula is the smaller of the two bones. It is on the outer side of your leg.  CAUSES  Low-energy injuries, such as a fall from ground level.  High-energy injuries, such as motor vehicle injuries, gunshot wounds, or high-speed sports collisions. RISK FACTORS  Jumping activities.  Repetitive stress, such as long-distance running.  Participation in sports.  Osteoporosis.  Advanced age. SIGNS AND SYMPTOMS  Pain.  Swelling.  Inability to put weight on your injured leg.  Bone deformities at the site of your injury.  Bruising. DIAGNOSIS  Tibial and fibular fractures are diagnosed with the use of X-ray exams. TREATMENT  If you have a simple fracture of these two bones, they can be treated with simple immobilization. A cast or splint will be used on your leg to keep it from moving while it heals. Then you can begin range-of-motion exercises to regain your knee motion. HOME CARE INSTRUCTIONS   Apply ice to your leg:  Put ice in a plastic bag.  Place a towel between your skin and the bag.  Leave the ice on for 20 minutes, 2-3 times a day.  If you have a plaster or fiberglass cast:  Do not try to scratch the skin under the cast using sharp or pointed objects.  Check the skin around the cast every day. You may put lotion on any red or sore areas.  Keep your cast dry and clean.  If you have a plaster splint:  Wear the splint as directed.  You may loosen the elastic around the splint if your toes become numb, tingle, or turn cold or blue.  Do not put pressure on any part of your cast or splint until it is fully hardened, because it may deform.  Your cast or splint can be protected during bathing with a plastic bag. Do not lower the cast or splint into water.  Use crutches as directed.  Only take  over-the-counter or prescription medicines for pain, discomfort, or fever as directed by your health care provider.  Follow all instructions given to you by your health care provider.  Make and keep all follow-up appointments. SEEK MEDICAL CARE IF:  Your pain is becoming worse rather than better or is not controlled with medicines.  You have increased swelling or redness in the foot.  You begin to lose feeling in your foot or toes. SEEK IMMEDIATE MEDICAL CARE IF:  You develop a cold or blue foot or toes on the injured side.  You develop severe pain in your injured leg, especially if the pain is increased with movement of your toes. MAKE SURE YOU:  Understand these instructions.  Will watch your condition.  Will get help right away if you are not doing well or get worse. Document Released: 05/22/2002 Document Revised: 06/20/2013 Document Reviewed: 04/11/2013 [facility] Patient Information 2015 [place], [place]. This information is not intended to replace advice given to you by your health care provider. Make sure you discuss any questions you have with your health care provider.   Emergency Department Resource Guide 1) Find a Doctor and Pay Out of Pocket Although you won't have to find out who is covered by your insurance plan, it is a good idea to ask around and get recommendations. You will then need to call the office and see if the doctor  you have chosen will accept you as a new patient and what types of options they offer for patients who are self-pay. Some doctors offer discounts or will set up payment plans for their patients who do not have insurance, but you will need to ask so you aren't surprised when you get to your appointment.  2) Contact Your Local Health Department Not all health departments have doctors that can see patients for sick visits, but many do, so it is worth a call to see if yours does. If you don't know where your local health department is, you can check in  your phone book. The CDC also has a tool to help you locate your state's health department, and many state websites also have listings of all of their local health departments.  3) Find a Walk-in Clinic If your illness is not likely to be very severe or complicated, you may want to try a walk in clinic. These are popping up all over the country in pharmacies, drugstores, and shopping centers. They're usually staffed by nurse practitioners or physician assistants that have been trained to treat common illnesses and complaints. They're usually fairly quick and inexpensive. However, if you have serious medical issues or chronic medical problems, these are probably not your best option.  No Primary Care Doctor: - Call Health Connect at  [phone] - they can help you locate a primary care doctor that  accepts your insurance, provides certain services, etc. - Physician Referral Service- [phone]  Chronic Pain Problems: Organization         Address  Phone   Notes  [facility]  [phone] Patients need to be referred by their primary care doctor.   Medication Assistance: Organization         Address  Phone   Notes  [facility] Medication [facility] [address] [place], [place] [address] [phone] --Must be a resident of [facility] -- Must have NO insurance coverage whatsoever (no Medicaid/ Medicare, etc.) -- The pt. MUST have a primary care doctor that directs their care regularly and follows them in the community   MedAssist  [phone]   [employer]  [phone]    Agencies that provide inexpensive medical care: Organization         Address  Phone   Notes  [facility]  [phone]   [facility]    [phone]   [facility] [address] [place], [place] [address] [phone]   [facility] [address] [place], [place] [phone]    Planned Parenthood    [phone]   [facility]    [phone]   [facility] and [facility]  [address], [place] Phone:  [phone], Fax:  [phone] Hours of Operation:  9 am - 6 pm, M-F.  Also accepts Medicaid/Medicare and self-pay.  [facility]  [address], [place] Phone: [phone], Fax: [phone]. Hours of Operation:  8:30 am - 5:30 pm, M-F.  Also accepts Medicaid and self-pay.  [facility] [place] [address] [place] Phone: [phone]   [facility] [address] Natasha Bence [place], [place] [phone], Ext. [phone] Mondays & Thursdays: 7-9 AM.  First 15 patients are seen on a first come, first serve basis.    Medicaid-accepting [facility] Providers:  Organization  Address  Phone   Notes  [facility] [address], [place] [phone] Also accepts self-pay patients.  [facility] [address], [place]  [phone]) [phone]   [facility] [address], [place] [phone](336) [phone]   [facility] [address] [place] [address], [place] [phone]) [phone]   Renaye RakersVeita Bland [address], [place]   [phone] Only accepts [place] Access [place] patients after they have their name applied to their card.   Self-Pay (no insurance) in [facility] [place]:  Organization         Address  Phone   Notes  Sickle Cell Patients, [facility] [address], [place] [phone]   [facility] [address], [place] [phone]) [phone]   [facility]  [address], [place] [phone]   [facility]/Dr. Osei-Bonsu  [address] [place] [address], [place] or [phone], [place] [phone]) [phone] Phone number for both [place] and [place] locations is the  same.  [facility] [address], [place] [phone]) [phone]   [facility] [place] [address] [place] [address], [place] or [address] [phone]) [phone]   [facility] [address], [place] [phone]) [phone], phone; [phone], fax Sees patients 1st and 3rd Saturday of every month.  Must not qualify for public or private insurance (i.e. Medicaid, Medicare, [place] Health Choice, Veterans' Benefits)  Household income should be no more than 200% of the poverty level The clinic cannot treat you if you are pregnant or think you are pregnant  Sexually transmitted diseases are not treated at the clinic.    Dental Care: Organization         Address  Phone  Notes  [facility] [address], [place] [phone]) [phone] Accepts children up to age 47 who are enrolled in [place]icaid or [place] Health Choice; pregnant women with a Medicaid card; and children who have applied for Medicaid or [place] Health Choice, but were declined, whose parents can pay a reduced fee at time of service.  [facility] of [facility] [place]  [address], [place] [phone]) [phone] Accepts children up to age 47 who are enrolled in [place]icaid or [place] Health Choice; pregnant women with a Medicaid card; and children who have applied for Medicaid or [place] Health Choice, but were declined, whose parents can pay a reduced fee at time of service.  [facility]  [address], [place] [phone]) [phone] Patients are seen by appointment only. Walk-ins are not accepted. [facility] will see patients 47 years of age and older. Monday - Tuesday (8am-5pm) Most Wednesdays (8:30-5pm) $30 per visit, cash only  [facility]  [address], [facility] [phone] Patients are seen by appointment only. Walk-ins are not accepted. [facility] will see patients  118 years of age and older. One Wednesday Evening (Monthly: Volunteer Based).  $30 per visit, cash only  [facility]  [phone]) [phone] for adults; Children under age 174, call Graduate Pediatric Dentistry at [phone]) [phone]. Children aged 104-14, please call [phone]) [phone] to request a pediatric application.  Dental services are provided in all areas of dental care including  fillings, crowns and bridges, complete and partial dentures, implants, gum treatment, root canals, and extractions. Preventive care is also provided. Treatment is provided to both adults and children. Patients are selected via a lottery and there is often a waiting list.   [facility] [address], [place]  [phone] [email or web address]   [facility] [address], [place], [place] [phone], Ext. [phone] Second and Fourth Thursday of each month, opens at 6:30 AM; Clinic ends at 9 AM.  Patients are seen on a first-come first-served basis, and a limited number are seen during each clinic.   [facility]  [address], [place], [place] [phone]) [phone]   Eligibility Requirements You must have lived in [place], [place], or [place] counties for at least the last three months.   You cannot be eligible for state or federal sponsored National Cityhealthcare insurance, including CIGNAVeterans Administration, [place]icaid, or [employer] EntertainmentMedicare.   You generally cannot be eligible for healthcare insurance through your employer.    How to apply: Eligibility screenings are held every Tuesday and Wednesday afternoon from 1:00 pm until 4:00 pm. You do not need an appointment for the interview!  [facility] [address], [place], [place] [phone]   [facility]  [phone]   [facility]  [phone]   [facility]  [phone]    Behavioral Health Resources in the Community: Intensive Outpatient  Programs Organization         Address  Phone  Notes  [facility] [address], [place], [place] [phone]   [facility] [address], [place], [place] [phone]   [facility] [address], [place], [place]  [phone]   [facility] [address],  [place], [place] [phone] or [phone]   Substance Abuse Resources Organization         Address  Phone  Notes  [facility]  [phone]   [facility]  [phone]   [facility]  [phone]   [facility]  [phone]   Residential & Outpatient Substance Abuse Program  [phone]   Psychological Services Organization         Address  Phone  Notes  [facility]  [phone]- [phone]   [facility]  [phone]- [phone]   [facility] [address], [place] [phone] or [phone]    Mobile Crisis Teams Organization         Address  Phone  Notes  [facility], Mobile Crisis Care Unit  [phone]   [facility]  [address] [place], [place] [phone]   Doristine LocksSharon DeEsch [address] [place] [phone]    Self-Help/Support Groups Organization         Address  Phone             Notes  [facility]oc[facility] of [place] - variety of support groups  336- [phone] Call for more information  Narcotics Anonymous (NA), Caring [facility] [address], [place]   2 meetings at this location   Statisticianesidential Treatment Programs Organization         Address  Phone  Notes  [facility] [address],    [place]  [phone]   [facility]  [address], [place] [id number], [place], [place] [phone]   [facility] [address], [place] [phone] Admissions: 8am-3pm M-F  [facility] [address]    [place], [place]  [phone]   [facility] [address], [place], [place] [phone]   [facility] [address]  [place], [place] [phone]   [facility] - Intensive Outpatient [address], [place], [place] [phone]   [facility] ([facility] [address]  [place], [place] [phone] or [phone]   [facility] (RT[facility]) [address] [place], [place] [phone] Accepts Medicaid  [facility] [address]  [place] [phone] Substance Abuse/Addiction Treatment   [facility] Behavioral Health Resources Organization         Address  Phone  Notes  [facility]  [phone]   Angie FavaJulie Brannon, PhD [address], [address] [place], [place]   [phone]) [phone] or [phone](336) [phone]   [facility]   [address] [place], [place] [phone]   [facility] [address], [place], [place] [phone]) [phone] Insurance/Medicaid/sponsorship through [facility]  [facility] [address]                                    [place], [place] [phone] Therapy/tele-psych/case  [facility] [address].   [place], [place] [phone](336) [phone]    Dr. Lolly MustacheArfeen  [phone]   [facility] [place]  [employer] [facility] 1) [address], [place] 2) [address], [place] 3)  [address], [place] [phone](336) [phone]  [phone]([phone]   [facility] Child Abuse Hotline [phone](336) [phone] or [phone](336) [phone] (After Hours)

## 2015-10-27 ENCOUNTER — Encounter (HOSPITAL_COMMUNITY): Payer: Self-pay | Admitting: Emergency Medicine

## 2015-10-27 DIAGNOSIS — Y9289 Other specified places as the place of occurrence of the external cause: Secondary | ICD-10-CM | POA: Insufficient documentation

## 2015-10-27 DIAGNOSIS — Z792 Long term (current) use of antibiotics: Secondary | ICD-10-CM | POA: Insufficient documentation

## 2015-10-27 DIAGNOSIS — Y93G1 Activity, food preparation and clean up: Secondary | ICD-10-CM | POA: Insufficient documentation

## 2015-10-27 DIAGNOSIS — W268XXA Contact with other sharp object(s), not elsewhere classified, initial encounter: Secondary | ICD-10-CM | POA: Insufficient documentation

## 2015-10-27 DIAGNOSIS — S61412A Laceration without foreign body of left hand, initial encounter: Secondary | ICD-10-CM | POA: Insufficient documentation

## 2015-10-27 DIAGNOSIS — Y998 Other external cause status: Secondary | ICD-10-CM | POA: Insufficient documentation

## 2015-10-27 DIAGNOSIS — Z23 Encounter for immunization: Secondary | ICD-10-CM | POA: Insufficient documentation

## 2015-10-27 DIAGNOSIS — Z791 Long term (current) use of non-steroidal anti-inflammatories (NSAID): Secondary | ICD-10-CM | POA: Insufficient documentation

## 2015-10-27 NOTE — ED Notes (Signed)
Pt. presents with laceration approx. 2" at left palm sustained this evening from a broken coffee cup while washing it, pt. applied dressing prior to arrival with mild bleeding .

## 2015-10-28 ENCOUNTER — Emergency Department (HOSPITAL_COMMUNITY)
Admission: EM | Admit: 2015-10-28 | Discharge: 2015-10-28 | Disposition: A | Discharge status: Home / Self Care | Payer: Self-pay | Attending: Emergency Medicine | Admitting: Emergency Medicine

## 2015-10-28 ENCOUNTER — Emergency Department (HOSPITAL_COMMUNITY): Payer: Self-pay

## 2015-10-28 DIAGNOSIS — IMO0002 Reserved for concepts with insufficient information to code with codable children: Secondary | ICD-10-CM

## 2015-10-28 MED ORDER — TETANUS-DIPHTH-ACELL PERTUSSIS 5-2.5-18.5 LF-MCG/0.5 IM SUSP
0.5000 mL | Freq: Once | INTRAMUSCULAR | Status: AC
  Administered 2015-10-28: 0.5 mL via INTRAMUSCULAR
  Filled 2015-10-28: qty 0.5

## 2015-10-28 MED ORDER — LIDOCAINE HCL (PF) 1 % IJ SOLN
30.0000 mL | Freq: Once | INTRAMUSCULAR | Status: AC
  Administered 2015-10-28: 30 mL
  Filled 2015-10-28: qty 30

## 2015-10-28 NOTE — ED Provider Notes (Signed)
CSN: [id number]     Arrival date & time 10/27/15  2313 History  By signing my name below, I, Douglas Murphy, attest that this documentation has been prepared under the direction and in the presence of Douglas Crease, MD . Electronically Signed: Marisue Murphy, Scribe. 10/28/2015. 4:04 AM.  Chief Complaint  Patient presents with  . Laceration   The history is provided by the patient. No language interpreter was used.   HPI Comments:  Douglas Murphy is a 50 y.o. male who presents to the Emergency Department complaining of small laceration to left palm sustained yesterday evening from a broken coffee cup. Pt applied dressing PTA with mild bleeding. Pt is unsure of last tetanus. Pt has no other symptoms or complaints at this time.  History reviewed. No pertinent past medical history. Past Surgical History  Procedure Laterality Date  . Knee arthroscopy     No family history on file. Social History  Substance Use Topics  . Smoking status: Never Smoker   . Smokeless tobacco: Never Used  . Alcohol Use: No    Review of Systems  Constitutional: Negative for fever.  Skin: Positive for wound.  All other systems reviewed and are negative.  Allergies  Review of patient's allergies indicates no known allergies.  Home Medications   Prior to Admission medications   Medication Sig Start Date End Date Taking? Authorizing Provider  cephALEXin (KEFLEX) 250 MG capsule Take 1 capsule (250 mg total) by mouth 4 (four) times daily. 01/30/14   Earley Favor, NP  HYDROcodone-acetaminophen (NORCO/VICODIN) 5-325 MG per tablet Take 1 tablet by mouth every 6 (six) hours as needed for moderate pain or severe pain. 10/15/14   Marissa Sciacca, PA-C  HYDROcodone-acetaminophen (NORCO/VICODIN) 5-325 MG per tablet Take 1-2 tablets by mouth every 6 (six) hours as needed. 11/26/14   Ladona Mow, PA-C  ibuprofen (ADVIL,MOTRIN) 600 MG tablet Take 1 tablet (600 mg total) by mouth every 8 (eight) hours as  needed. 01/30/14   Earley Favor, NP  ibuprofen (ADVIL,MOTRIN) 800 MG tablet Take 1 tablet (800 mg total) by mouth 3 (three) times daily. 02/17/13   Reuben Likes, MD  ibuprofen (ADVIL,MOTRIN) 800 MG tablet Take 1 tablet (800 mg total) by mouth 3 (three) times daily. 11/26/14   Ladona Mow, PA-C  metroNIDAZOLE (FLAGYL) 500 MG tablet Take 1 tablet (500 mg total) by mouth 3 (three) times daily. 02/17/13   Reuben Likes, MD  oxyCODONE-acetaminophen (PERCOCET) 5-325 MG per tablet 1 to 2 tablets every 6 hours as needed for pain. 02/17/13   Reuben Likes, MD  penicillin v potassium (VEETID) 500 MG tablet Take 1 tablet (500 mg total) by mouth 4 (four) times daily. 02/17/13   Reuben Likes, MD  predniSONE (DELTASONE) 20 MG tablet 3 tabs po day one, then 2 tabs daily x 4 days 10/15/14   Marissa Sciacca, PA-C   BP 123/90 mmHg  Pulse 112  Temp(Src) 97.8 F (36.6 C) (Oral)  Resp 18  Ht 5' 11.5" (1.816 m)  Wt 204 lb 5 oz (92.676 kg)  BMI 28.10 kg/m2  SpO2 97% Physical Exam  Constitutional: He is oriented to person, place, and time. He appears well-developed and well-nourished. No distress.  HENT:  Head: Normocephalic and atraumatic.  Right Ear: Hearing normal.  Left Ear: Hearing normal.  Nose: Nose normal.  Mouth/Throat: Oropharynx is clear and moist and mucous membranes are normal.  Eyes: Conjunctivae and EOM are normal. Pupils are equal, round, and reactive to light.  Neck: Normal range of motion. Neck supple.  Cardiovascular: Regular rhythm, S1 normal and S2 normal.  Exam reveals no gallop and no friction rub.   No murmur heard. Pulmonary/Chest: Effort normal and breath sounds normal. No respiratory distress. He exhibits no tenderness.  Abdominal: Soft. Normal appearance and bowel sounds are normal. There is no hepatosplenomegaly. There is no tenderness. There is no rebound, no guarding, no tenderness at McBurney's point and negative Murphy's sign. No hernia.  Musculoskeletal: Normal range of motion.   Normal flexion and extension of fingers, normal abduction, abduction and opposition of thumb  Normal capillary refill and sensation distal to the laceration  Neurological: He is alert and oriented to person, place, and time. He has normal strength. No cranial nerve deficit or sensory deficit. Coordination normal. GCS eye subscore is 4. GCS verbal subscore is 5. GCS motor subscore is 6.  Skin: Skin is warm and dry. No rash noted. No cyanosis.  2.5 cm linear laceration thenar eminence left hand  Psychiatric: He has a normal mood and affect. His speech is normal and behavior is normal. Thought content normal.  Nursing note and vitals reviewed.   ED Course  Procedures  DIAGNOSTIC STUDIES:  Oxygen Saturation is 97% on RA, normal by my interpretation.    COORDINATION OF CARE:  3:28 AM Will numb the area and apply stitches. Discussed treatment plan with pt at bedside and pt agreed to plan.  LACERATION REPAIR Performed by: Douglas Crease, MD  Consent: Verbal consent obtained. Risks and benefits: risks, benefits and alternatives were discussed Patient identity confirmed: provided demographic data Time out performed prior to procedure Prepped and Draped in normal sterile fashion Wound explored Laceration Location: left palm Laceration Length: 2.5 cm No Foreign Bodies seen or palpated Anesthesia: local infiltration Local anesthetic: lidocaine 1% without epinephrine Anesthetic total: 2 ml Irrigation method: syringe Amount of cleaning: standard Skin closure: suture Number of sutures or staples: 4 Technique: simple interrupted, 3-0 vicryl Patient tolerance: Patient tolerated the procedure well with no immediate complications.  Labs Review Labs Reviewed - No data to display  Imaging Review Dg Hand Complete Left  10/28/2015  CLINICAL DATA:  50 year old male with laceration of the palm with glass. EXAM: LEFT HAND - COMPLETE 3+ VIEW COMPARISON:  None. FINDINGS: There is no acute  fracture or dislocation. The bones are well mineralized. The soft tissues appear unremarkable. No radiopaque foreign object. IMPRESSION: Negative. Electronically Signed   By: Douglas Murphy M.D.   On: 10/28/2015 00:33   I have personally reviewed and evaluated these images and lab results as part of my medical decision-making.   EKG Interpretation None      MDM   Final diagnoses:  None   laceration  Presents with laceration over the thenar eminence of left hand. Patient is neurovascularly intact, no concern for tendon injury. X-ray negative. Exploration does not reveal any foreign bodies.  I personally performed the services described in this documentation, which was scribed in my presence. The recorded information has been reviewed and is accurate.      Douglas Crease, MD 10/28/15 [phone]

## 2015-11-27 ENCOUNTER — Emergency Department (HOSPITAL_COMMUNITY)
Admission: EM | Admit: 2015-11-27 | Discharge: 2015-11-27 | Disposition: A | Discharge status: Home / Self Care | Payer: Self-pay | Attending: Emergency Medicine | Admitting: Emergency Medicine

## 2015-11-27 ENCOUNTER — Encounter (HOSPITAL_COMMUNITY): Payer: Self-pay | Admitting: Emergency Medicine

## 2015-11-27 DIAGNOSIS — M436 Torticollis: Secondary | ICD-10-CM | POA: Insufficient documentation

## 2015-11-27 DIAGNOSIS — Z792 Long term (current) use of antibiotics: Secondary | ICD-10-CM | POA: Insufficient documentation

## 2015-11-27 DIAGNOSIS — M549 Dorsalgia, unspecified: Secondary | ICD-10-CM | POA: Insufficient documentation

## 2015-11-27 DIAGNOSIS — Z791 Long term (current) use of non-steroidal anti-inflammatories (NSAID): Secondary | ICD-10-CM | POA: Insufficient documentation

## 2015-11-27 MED ORDER — METHOCARBAMOL 500 MG PO TABS: 500.0000 mg | ORAL_TABLET | Freq: Two times a day (BID) | ORAL | Status: DC

## 2015-11-27 MED ORDER — IBUPROFEN 800 MG PO TABS: 800.0000 mg | ORAL_TABLET | Freq: Three times a day (TID) | ORAL | Status: DC

## 2015-11-27 NOTE — ED Provider Notes (Signed)
CSN: [id number]     Arrival date & time 11/27/15  1511 History  By signing my name below, I, Douglas Murphy, attest that this documentation has been prepared under the direction and in the presence of non-physician practitioner, Fayrene HelperBowie Schelly Chuba, PA-C. Electronically Signed: [name], Scribe. 11/27/2015. 4:13 PM.    Chief Complaint  Patient presents with  . Neck Pain    The history is provided by the patient. No language interpreter was used.     HPI Comments:  Douglas Murphy is a 50 y.o. male who presents to the Emergency Department complaining of left sided neck pain since waking 2 days ago. His pain radiates to his left shoulder He reports increased pain with movement. Pt also reports h/o same when he "sleeps wrong" but notes the symptoms usually resolves in the same day. He has applied heat and taken advil with mild relief. He denies fever, chest pain, SOB, diaphoresis, lightheadedness, and HA. He also denies h/o DM and CAD.  History reviewed. No pertinent past medical history. Past Surgical History  Procedure Laterality Date  . Knee arthroscopy     No family history on file. Social History  Substance Use Topics  . Smoking status: Never Smoker   . Smokeless tobacco: Never Used  . Alcohol Use: No    Review of Systems  Constitutional: Negative for fever and diaphoresis.  Respiratory: Negative for shortness of breath.   Cardiovascular: Negative for chest pain.  Musculoskeletal: Positive for back pain.  Neurological: Negative for light-headedness and headaches.    Allergies  Review of patient's allergies indicates no known allergies.  Home Medications   Prior to Admission medications   Medication Sig Start Date End Date Taking? Authorizing Provider  cephALEXin (KEFLEX) 250 MG capsule Take 1 capsule (250 mg total) by mouth 4 (four) times daily. 01/30/14   Earley FavorGail Schulz, NP  HYDROcodone-acetaminophen (NORCO/VICODIN) 5-325 MG per tablet Take 1 tablet by mouth every 6 (six)  hours as needed for moderate pain or severe pain. 10/15/14   Marissa Sciacca, PA-C  HYDROcodone-acetaminophen (NORCO/VICODIN) 5-325 MG per tablet Take 1-2 tablets by mouth every 6 (six) hours as needed. 11/26/14   Ladona MowJoe Mintz, PA-C  ibuprofen (ADVIL,MOTRIN) 600 MG tablet Take 1 tablet (600 mg total) by mouth every 8 (eight) hours as needed. 01/30/14   Earley FavorGail Schulz, NP  ibuprofen (ADVIL,MOTRIN) 800 MG tablet Take 1 tablet (800 mg total) by mouth 3 (three) times daily. 02/17/13   Reuben Likesavid C Keller, MD  ibuprofen (ADVIL,MOTRIN) 800 MG tablet Take 1 tablet (800 mg total) by mouth 3 (three) times daily. 11/26/14   Ladona MowJoe Mintz, PA-C  metroNIDAZOLE (FLAGYL) 500 MG tablet Take 1 tablet (500 mg total) by mouth 3 (three) times daily. 02/17/13   Reuben Likesavid C Keller, MD  oxyCODONE-acetaminophen (PERCOCET) 5-325 MG per tablet 1 to 2 tablets every 6 hours as needed for pain. 02/17/13   Reuben Likesavid C Keller, MD  penicillin v potassium (VEETID) 500 MG tablet Take 1 tablet (500 mg total) by mouth 4 (four) times daily. 02/17/13   Reuben Likesavid C Keller, MD  predniSONE (DELTASONE) 20 MG tablet 3 tabs po day one, then 2 tabs daily x 4 days 10/15/14   Marissa Sciacca, PA-C   BP 118/92 mmHg  Pulse 109  Temp(Src) 98 F (36.7 C) (Oral)  Resp 18  SpO2 98% Physical Exam  Constitutional: He is oriented to person, place, and time. He appears well-developed and well-nourished. No distress.  HENT:  Head: Normocephalic and atraumatic.  Eyes: Conjunctivae  are normal.  Neck: Normal range of motion. No JVD present.  Tenderness noted to cervical midline and left paracervinal pain along with decreased left neck lateral rotation. No overlying skin changes  No carotid bruit   Cardiovascular: Normal rate, regular rhythm and normal heart sounds.  Exam reveals no gallop and no friction rub.   No murmur heard. Pulmonary/Chest: Effort normal and breath sounds normal. No respiratory distress. He has no wheezes. He has no rales. He exhibits no tenderness.  Abdominal: Soft.  He exhibits no distension.  Lymphadenopathy:    He has no cervical adenopathy.  Neurological: He is alert and oriented to person, place, and time.  Skin: Skin is warm and dry.  Psychiatric: He has a normal mood and affect.  Nursing note and vitals reviewed.   ED Course  Procedures   DIAGNOSTIC STUDIES:  Oxygen Saturation is 98% on RA, normal by my interpretation.    COORDINATION OF CARE:  4:16 PM Will discharge with robaxin and ibuprofen and ortho referral. Discussed treatment plan with pt at bedside and pt agreed to plan.   MDM  BP 118/92 mmHg  Pulse 109  Temp(Src) 98 F (36.7 C) (Oral)  Resp 18  SpO2 98% Mild tachycardia.  No CP or SOB to suggest PE.  Low suspicion for vertebral artery dissection.  Suspect pain causing tachycardia.  Pt is well appearing.   Patient with neck pain.  No neurological deficits and normal neuro exam.  Patient is ambulatory. Supportive care and return precaution discussed. Appears safe for discharge at this time. Follow up as indicated in discharge paperwork.   Final diagnoses:  Torticollis, acute    I personally performed the services described in this documentation, which was scribed in my presence. The recorded information has been reviewed and is accurate.      Fayrene Helper, PA-C 11/27/15 1624  Mancel Bale, MD 11/28/15 [phone]

## 2015-11-27 NOTE — ED Notes (Addendum)
Patient reports pain to left posterior neck x1 day. He states he believes he slept on it wrong. Denies injury, radiating pain, SOB, CP, loss of bowel or bladder, numbness or tingling, able to rotate head to left and right. Minimal relief with heat and ibuprofen. Rates pain 8/10.

## 2015-11-27 NOTE — Discharge Instructions (Signed)
Acute Torticollis °Torticollis is a condition in which the muscles of the neck tighten (contract) abnormally, causing the neck to twist and the head to move into an unnatural position. Torticollis that develops suddenly is called acute torticollis. If torticollis becomes chronic and is left untreated, the face and neck can become deformed. °CAUSES °This condition may be caused by: °· Sleeping in an awkward position (common). °· Extending or twisting the neck muscles beyond their normal position. °· Infection. °In some cases, the cause may not be known. °SYMPTOMS °Symptoms of this condition include: °· An unnatural position of the head. °· Neck pain. °· A limited ability to move the neck. °· Twisting of the neck to one side. °DIAGNOSIS °This condition is diagnosed with a physical exam. You may also have imaging tests, such as an X-ray, CT scan, or MRI. °TREATMENT °Treatment for this condition involves trying to relax the neck muscles. It may include: °· Medicines or shots. °· Physical therapy. °· Surgery. This may be done in severe cases. °HOME CARE INSTRUCTIONS °· Take medicines only as directed by your health care provider. °· Do stretching exercises and massage your neck as directed by your health care provider. °· Keep all follow-up visits as directed by your health care provider. This is important. °SEEK MEDICAL CARE IF: °· You develop a fever. °SEEK IMMEDIATE MEDICAL CARE IF: °· You develop difficulty breathing. °· You develop noisy breathing (stridor). °· You start drooling. °· You have trouble swallowing or have pain with swallowing. °· You develop numbness or weakness in your hands or feet. °· You have changes in your speech, understanding, or vision. °· Your pain gets worse. °  °This information is not intended to replace advice given to you by your health care provider. Make sure you discuss any questions you have with your health care provider. °  °Document Released: 08/27/2000 Document Revised:  01/14/2015 Document Reviewed: 08/26/2014 °Elsevier Interactive Patient Education ©2016 [employer] ° °

## 2016-06-01 ENCOUNTER — Encounter (HOSPITAL_COMMUNITY): Payer: Self-pay | Admitting: Emergency Medicine

## 2016-06-01 ENCOUNTER — Emergency Department (HOSPITAL_COMMUNITY): Payer: Self-pay

## 2016-06-01 ENCOUNTER — Emergency Department (HOSPITAL_COMMUNITY)
Admission: EM | Admit: 2016-06-01 | Discharge: 2016-06-01 | Disposition: A | Discharge status: Home / Self Care | Payer: Self-pay | Attending: Emergency Medicine | Admitting: Emergency Medicine

## 2016-06-01 DIAGNOSIS — M25562 Pain in left knee: Secondary | ICD-10-CM | POA: Insufficient documentation

## 2016-06-01 DIAGNOSIS — M25561 Pain in right knee: Secondary | ICD-10-CM

## 2016-06-01 DIAGNOSIS — Y999 Unspecified external cause status: Secondary | ICD-10-CM | POA: Insufficient documentation

## 2016-06-01 DIAGNOSIS — W228XXA Striking against or struck by other objects, initial encounter: Secondary | ICD-10-CM | POA: Insufficient documentation

## 2016-06-01 DIAGNOSIS — Y939 Activity, unspecified: Secondary | ICD-10-CM | POA: Insufficient documentation

## 2016-06-01 DIAGNOSIS — M546 Pain in thoracic spine: Secondary | ICD-10-CM

## 2016-06-01 DIAGNOSIS — Y929 Unspecified place or not applicable: Secondary | ICD-10-CM | POA: Insufficient documentation

## 2016-06-01 MED ORDER — IBUPROFEN 800 MG PO TABS: 800.0000 mg | ORAL_TABLET | Freq: Three times a day (TID) | ORAL | 0 refills | Status: DC
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
## 2016-06-01 NOTE — Discharge Instructions (Signed)
Wear knee sleeves for additional support. Take ibuprofen as needed for pain. Follow up with orthopedic provider for re-evaluation or if your symptoms do not improve. Return to the ED if you experience severe worsening of your symptoms, loss of control of your bowels or bladder, numbness or tingling in your lower extremities.

## 2016-06-01 NOTE — ED Notes (Signed)
Pt called, but did not answer. Will try again.

## 2016-06-01 NOTE — ED Notes (Signed)
Patient transported to X-ray 

## 2016-06-16 ENCOUNTER — Encounter (HOSPITAL_COMMUNITY): Payer: Self-pay

## 2016-06-16 ENCOUNTER — Emergency Department (HOSPITAL_COMMUNITY)
Admission: EM | Admit: 2016-06-16 | Discharge: 2016-06-16 | Disposition: A | Discharge status: Home / Self Care | Payer: Self-pay | Attending: Emergency Medicine | Admitting: Emergency Medicine

## 2016-06-16 DIAGNOSIS — Y999 Unspecified external cause status: Secondary | ICD-10-CM | POA: Insufficient documentation

## 2016-06-16 DIAGNOSIS — M25561 Pain in right knee: Secondary | ICD-10-CM | POA: Insufficient documentation

## 2016-06-16 DIAGNOSIS — X501XXA Overexertion from prolonged static or awkward postures, initial encounter: Secondary | ICD-10-CM | POA: Insufficient documentation

## 2016-06-16 DIAGNOSIS — Y92811 Bus as the place of occurrence of the external cause: Secondary | ICD-10-CM | POA: Insufficient documentation

## 2016-06-16 DIAGNOSIS — Y939 Activity, unspecified: Secondary | ICD-10-CM | POA: Insufficient documentation

## 2016-06-16 MED ORDER — PREDNISONE 10 MG (21) PO TBPK: 10.0000 mg | ORAL_TABLET | Freq: Every day | ORAL | 0 refills | Status: DC
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
## 2016-06-16 NOTE — ED Notes (Signed)
Pt is in stable condition upon d/c and ambulates from ED. 

## 2016-08-27 ENCOUNTER — Emergency Department (HOSPITAL_COMMUNITY)
Admission: EM | Admit: 2016-08-27 | Discharge: 2016-08-27 | Disposition: A | Discharge status: Home / Self Care | Payer: Self-pay | Attending: Emergency Medicine | Admitting: Emergency Medicine

## 2016-08-27 ENCOUNTER — Emergency Department (HOSPITAL_COMMUNITY): Payer: Self-pay

## 2016-08-27 ENCOUNTER — Encounter (HOSPITAL_COMMUNITY): Payer: Self-pay

## 2016-08-27 DIAGNOSIS — L03032 Cellulitis of left toe: Secondary | ICD-10-CM | POA: Insufficient documentation

## 2016-08-27 DIAGNOSIS — Z79899 Other long term (current) drug therapy: Secondary | ICD-10-CM | POA: Insufficient documentation

## 2016-08-27 LAB — CBC WITH DIFFERENTIAL/PLATELET
Basophils Absolute: 0 10*3/uL (ref 0.0–0.1)
Basophils Relative: 0 %
EOS ABS: 0.4 10*3/uL (ref 0.0–0.7)
EOS PCT: 3 %
HCT: 41.1 % (ref 39.0–52.0)
Hemoglobin: 13.9 g/dL (ref 13.0–17.0)
LYMPHS ABS: 2.1 10*3/uL (ref 0.7–4.0)
Lymphocytes Relative: 17 %
MCH: 29.1 pg (ref 26.0–34.0)
MCHC: 33.8 g/dL (ref 30.0–36.0)
MCV: 86.2 fL (ref 78.0–100.0)
MONOS PCT: 7 %
Monocytes Absolute: 0.9 10*3/uL (ref 0.1–1.0)
Neutro Abs: 9.1 10*3/uL — ABNORMAL HIGH (ref 1.7–7.7)
Neutrophils Relative %: 73 %
PLATELETS: 245 10*3/uL (ref 150–400)
RBC: 4.77 MIL/uL (ref 4.22–5.81)
RDW: 12.3 % (ref 11.5–15.5)
WBC: 12.6 10*3/uL — AB (ref 4.0–10.5)

## 2016-08-27 LAB — BASIC METABOLIC PANEL
Anion gap: 10 (ref 5–15)
BUN: 14 mg/dL (ref 6–20)
CO2: 27 mmol/L (ref 22–32)
CREATININE: 1.13 mg/dL (ref 0.61–1.24)
Calcium: 9.3 mg/dL (ref 8.9–10.3)
Chloride: 100 mmol/L — ABNORMAL LOW (ref 101–111)
GFR calc Af Amer: >60 mL/min (ref >60)
Glucose, Bld: 432 mg/dL — ABNORMAL HIGH (ref 65–99)
Potassium: 4.4 mmol/L (ref 3.5–5.1)
Sodium: 137 mmol/L (ref 135–145)

## 2016-08-27 LAB — CBG MONITORING, ED: Glucose-Capillary: 229 mg/dL — ABNORMAL HIGH (ref 65–99)

## 2016-08-27 MED ORDER — SODIUM CHLORIDE 0.9 % IV BOLUS (SEPSIS)
2000.0000 mL | Freq: Once | INTRAVENOUS | Status: AC
  Administered 2016-08-27: 2000 mL via INTRAVENOUS

## 2016-08-27 MED ORDER — SULFAMETHOXAZOLE-TRIMETHOPRIM 800-160 MG PO TABS: 1.0000 | ORAL_TABLET | Freq: Two times a day (BID) | ORAL | 0 refills | Status: AC

## 2016-08-27 MED ORDER — CEPHALEXIN 500 MG PO CAPS: 500.0000 mg | ORAL_CAPSULE | Freq: Four times a day (QID) | ORAL | 0 refills | Status: DC

## 2016-08-27 MED ORDER — METFORMIN HCL 500 MG PO TABS: 500.0000 mg | ORAL_TABLET | Freq: Two times a day (BID) | ORAL | 1 refills | Status: DC

## 2016-08-27 MED ORDER — CEFAZOLIN IN D5W 1 GM/50ML IV SOLN
1.0000 g | Freq: Once | INTRAVENOUS | Status: AC
  Administered 2016-08-27: 1 g via INTRAVENOUS
  Filled 2016-08-27: qty 50

## 2016-08-27 MED ORDER — LIDOCAINE HCL (PF) 1 % IJ SOLN
5.0000 mL | Freq: Once | INTRAMUSCULAR | Status: AC
  Administered 2016-08-27: 5 mL
  Filled 2016-08-27: qty 5

## 2016-08-27 NOTE — ED Provider Notes (Signed)
[facility] Provider Note   CSN: [id number] Arrival date & time: 08/27/16  1234     History   Chief Complaint Chief Complaint  Patient presents with  . Wound Infection    HPI Douglas Murphy is a 50 y.o. male.  Patient presents to the ED with a chief complaint of left great toe pain.  He states that he dropped a porcelain cup on it about a week ago, and pulled a piece out from his toe.  He states that in the past few days he has noticed some increasing redness, swelling, and increased pain.  He is uncertain about whether he is diabetic.  He denies any fevers, chills, nausea, or vomiting.  His symptoms are worsened when he ambulates.    The history is provided by the patient. No language interpreter was used.    No past medical history on file.  There are no active problems to display for this patient.   Past Surgical History:  Procedure Laterality Date  . KNEE ARTHROSCOPY         Home Medications    Prior to Admission medications   Medication Sig Start Date End Date Taking? Authorizing Provider  cephALEXin (KEFLEX) 250 MG capsule Take 1 capsule (250 mg total) by mouth 4 (four) times daily. 01/30/14   Earley Favor, NP  ibuprofen (ADVIL,MOTRIN) 800 MG tablet Take 1 tablet (800 mg total) by mouth 3 (three) times daily. 11/27/15   Fayrene Helper, PA-C  ibuprofen (ADVIL,MOTRIN) 800 MG tablet Take 1 tablet (800 mg total) by mouth 3 (three) times daily. 06/01/16   Samantha Tripp Dowless, PA-C  methocarbamol (ROBAXIN) 500 MG tablet Take 1 tablet (500 mg total) by mouth 2 (two) times daily. 11/27/15   Fayrene Helper, PA-C  metroNIDAZOLE (FLAGYL) 500 MG tablet Take 1 tablet (500 mg total) by mouth 3 (three) times daily. 02/17/13   Reuben Likes, MD  penicillin v potassium (VEETID) 500 MG tablet Take 1 tablet (500 mg total) by mouth 4 (four) times daily. 02/17/13   Reuben Likes, MD  predniSONE (STERAPRED UNI-PAK 21 TAB) 10 MG (21) TBPK tablet Take 1 tablet (10 mg total) by mouth  daily. 6,5,4,3,2,1 taper 06/16/16   Elson Areas, PA-C    Family History No family history on file.  Social History Social History  Substance Use Topics  . Smoking status: Never Smoker  . Smokeless tobacco: Never Used  . Alcohol use No     Allergies   Patient has no known allergies.   Review of Systems Review of Systems  Musculoskeletal: Positive for arthralgias and joint swelling.  Skin: Positive for color change and wound.  All other systems reviewed and are negative.    Physical Exam Updated Vital Signs BP 136/95 (BP Location: Right Arm)   Pulse 115   Temp 97.8 F (36.6 C) (Oral)   Resp 16   Ht 6' (1.829 m)   Wt 87.1 kg   SpO2 100%   BMI 26.04 kg/m   Physical Exam  Constitutional: He is oriented to person, place, and time. He appears well-developed and well-nourished.  HENT:  Head: Normocephalic and atraumatic.  Eyes: Conjunctivae and EOM are normal. Pupils are equal, round, and reactive to light. Right eye exhibits no discharge. Left eye exhibits no discharge. No scleral icterus.  Neck: Normal range of motion. Neck supple. No JVD present.  Cardiovascular: Regular rhythm and normal heart sounds.  Exam reveals no gallop and no friction rub.   No murmur  heard. Mild tachycardic on my exam  Pulmonary/Chest: Effort normal and breath sounds normal. No respiratory distress. He has no wheezes. He has no rales. He exhibits no tenderness.  Abdominal: Soft. He exhibits no distension and no mass. There is no tenderness. There is no rebound and no guarding.  Musculoskeletal: Normal range of motion. He exhibits no edema or tenderness.  Left great toe tender to palpation  Neurological: He is alert and oriented to person, place, and time.  Skin: Skin is warm and dry.  Erythema of the left great toe, healing puncture wound on the inferior aspect, mild streaking to mid foot  Psychiatric: He has a normal mood and affect. His behavior is normal. Judgment and thought content  normal.  Nursing note and vitals reviewed.    ED Treatments / Results  Labs (all labs ordered are listed, but only abnormal results are displayed) Labs Reviewed  CBC WITH DIFFERENTIAL/PLATELET  BASIC METABOLIC PANEL    EKG  EKG Interpretation None       Radiology No results found.  Procedures Procedures (including critical care time) INCISION AND DRAINAGE Performed by: Roxy HorsemanBROWNING, Rasheka Denard Consent: Verbal consent obtained. Risks and benefits: risks, benefits and alternatives were discussed Type: abscess  Body area: left great toe  Anesthesia: local infiltration  Incision was made with a scalpel.  Local anesthetic: lidocaine 1% without epinephrine  Anesthetic total: 1 ml  Complexity: complex Blunt dissection to break up loculations  Drainage: purulent  Drainage amount: scant  Packing material: not packed  Patient tolerance: Patient tolerated the procedure well with no immediate complications.    Medications Ordered in ED Medications - No data to display   Initial Impression / Assessment and Plan / ED Course  I have reviewed the triage vital signs and the nursing notes.  Pertinent labs & imaging results that were available during my care of the patient were reviewed by me and considered in my medical decision making (see chart for details).  Clinical Course     Patient with left great toe pain, probable infection.  Will check plain films to rule out retained foreign body.  Patient feels like he got all the pieces out of his toe after the incident.  Glucose is 432.   Patient seen by and discussed with Dr. Clarene DukeLittle, recommends given some Ancef here and 2 L of fluid.  Attempt I&D, and anticipate discharge.  I&D produced only scant discharge, but the location where the foreign body was located has been explored, and no other FB is palpated or visible.  Patient advised that he will need to follow-up with his PCP closely, I have advised him to call next  week.  Will start patient on Metformin and abx.  Final Clinical Impressions(s) / ED Diagnoses   Final diagnoses:  Cellulitis of toe of left foot    New Prescriptions Discharge Medication List as of 08/27/2016  8:23 PM    START taking these medications   Details  cephALEXin (KEFLEX) 500 MG capsule Take 1 capsule (500 mg total) by mouth 4 (four) times daily., Starting Fri 08/27/2016, Print    metFORMIN (GLUCOPHAGE) 500 MG tablet Take 1 tablet (500 mg total) by mouth 2 (two) times daily with a meal., Starting Fri 08/27/2016, Print    sulfamethoxazole-trimethoprim (BACTRIM DS,SEPTRA DS) 800-160 MG tablet Take 1 tablet by mouth 2 (two) times daily., Starting Fri 08/27/2016, Until Fri 09/03/2016, Print         Roxy Horsemanobert Ebrima Ranta, PA-C 08/27/16 2102    Ambrose Finlandachel Morgan  Little, MD 08/29/16 1859

## 2016-08-27 NOTE — ED Notes (Signed)
Pt returned to room from xray.

## 2016-08-27 NOTE — ED Triage Notes (Signed)
Pt. Punctured his rt. Toe when he dropped a porcelin cup and it broke.   Rt. Great toe is red, swollen, and warm to toucn.  Pt. Also has a red streak climbing up his rt. Foot.

## 2016-08-27 NOTE — ED Notes (Signed)
Patient transported to X-ray 

## 2016-09-24 ENCOUNTER — Emergency Department (HOSPITAL_COMMUNITY): Payer: Self-pay

## 2016-09-24 ENCOUNTER — Encounter (HOSPITAL_COMMUNITY): Payer: Self-pay | Admitting: Emergency Medicine

## 2016-09-24 ENCOUNTER — Inpatient Hospital Stay (HOSPITAL_COMMUNITY)
Admission: EM | Admit: 2016-09-24 | Discharge: 2016-09-26 | DRG: 617 | Disposition: A | Discharge status: Home / Self Care | Payer: Self-pay | Attending: Family Medicine | Admitting: Family Medicine

## 2016-09-24 DIAGNOSIS — Z7984 Long term (current) use of oral hypoglycemic drugs: Secondary | ICD-10-CM

## 2016-09-24 DIAGNOSIS — L03032 Cellulitis of left toe: Secondary | ICD-10-CM | POA: Diagnosis present

## 2016-09-24 DIAGNOSIS — E11628 Type 2 diabetes mellitus with other skin complications: Secondary | ICD-10-CM

## 2016-09-24 DIAGNOSIS — E1169 Type 2 diabetes mellitus with other specified complication: Principal | ICD-10-CM | POA: Diagnosis present

## 2016-09-24 DIAGNOSIS — E119 Type 2 diabetes mellitus without complications: Secondary | ICD-10-CM

## 2016-09-24 DIAGNOSIS — M869 Osteomyelitis, unspecified: Secondary | ICD-10-CM | POA: Diagnosis present

## 2016-09-24 LAB — CBC WITH DIFFERENTIAL/PLATELET
Basophils Absolute: 0 10*3/uL (ref 0.0–0.1)
Basophils Relative: 0 %
EOS ABS: 0.8 10*3/uL — AB (ref 0.0–0.7)
Eosinophils Relative: 10 %
HEMATOCRIT: 39.1 % (ref 39.0–52.0)
HEMOGLOBIN: 13.3 g/dL (ref 13.0–17.0)
LYMPHS ABS: 2.4 10*3/uL (ref 0.7–4.0)
LYMPHS PCT: 33 %
MCH: 29 pg (ref 26.0–34.0)
MCHC: 34 g/dL (ref 30.0–36.0)
MCV: 85.4 fL (ref 78.0–100.0)
MONOS PCT: 10 %
Monocytes Absolute: 0.8 10*3/uL (ref 0.1–1.0)
NEUTROS ABS: 3.4 10*3/uL (ref 1.7–7.7)
Neutrophils Relative %: 47 %
Platelets: 244 10*3/uL (ref 150–400)
RBC: 4.58 MIL/uL (ref 4.22–5.81)
RDW: 12.5 % (ref 11.5–15.5)
WBC: 7.3 10*3/uL (ref 4.0–10.5)

## 2016-09-24 LAB — GLUCOSE, CAPILLARY: Glucose-Capillary: 168 mg/dL — ABNORMAL HIGH (ref 65–99)

## 2016-09-24 LAB — BASIC METABOLIC PANEL
Anion gap: 11 (ref 5–15)
BUN: 14 mg/dL (ref 6–20)
CHLORIDE: 98 mmol/L — AB (ref 101–111)
CO2: 26 mmol/L (ref 22–32)
CREATININE: 0.72 mg/dL (ref 0.61–1.24)
Calcium: 9.2 mg/dL (ref 8.9–10.3)
GFR calc Af Amer: >60 mL/min (ref >60)
GFR calc non Af Amer: >60 mL/min (ref >60)
Glucose, Bld: 236 mg/dL — ABNORMAL HIGH (ref 65–99)
POTASSIUM: 3.9 mmol/L (ref 3.5–5.1)
Sodium: 135 mmol/L (ref 135–145)

## 2016-09-24 MED ORDER — INSULIN ASPART 100 UNIT/ML ~~LOC~~ SOLN
0.0000 [IU] | Freq: Three times a day (TID) | SUBCUTANEOUS | Status: DC
  Administered 2016-09-25: 5 [IU] via SUBCUTANEOUS

## 2016-09-24 MED ORDER — CIPROFLOXACIN IN D5W 400 MG/200ML IV SOLN
400.0000 mg | Freq: Two times a day (BID) | INTRAVENOUS | Status: DC
  Administered 2016-09-24 – 2016-09-26 (×4): 400 mg via INTRAVENOUS
  Filled 2016-09-24 (×5): qty 200

## 2016-09-24 MED ORDER — VANCOMYCIN HCL 10 G IV SOLR
1750.0000 mg | Freq: Once | INTRAVENOUS | Status: DC
  Filled 2016-09-24: qty 1750

## 2016-09-24 MED ORDER — SODIUM CHLORIDE 0.9% FLUSH: 3.0000 mL | INTRAVENOUS | Status: DC | PRN

## 2016-09-24 MED ORDER — SODIUM CHLORIDE 0.9 % IV SOLN: 250.0000 mL | INTRAVENOUS | Status: DC | PRN

## 2016-09-24 MED ORDER — SODIUM CHLORIDE 0.9 % IV SOLN
1750.0000 mg | Freq: Once | INTRAVENOUS | Status: AC
  Administered 2016-09-24: 1750 mg via INTRAVENOUS
  Filled 2016-09-24: qty 1750

## 2016-09-24 MED ORDER — POLYETHYLENE GLYCOL 3350 17 G PO PACK: 17.0000 g | PACK | Freq: Every day | ORAL | Status: DC | PRN

## 2016-09-24 MED ORDER — SODIUM CHLORIDE 0.9% FLUSH
3.0000 mL | Freq: Two times a day (BID) | INTRAVENOUS | Status: DC
  Administered 2016-09-24: 3 mL via INTRAVENOUS

## 2016-09-24 MED ORDER — VANCOMYCIN HCL IN DEXTROSE 1-5 GM/200ML-% IV SOLN
1000.0000 mg | Freq: Three times a day (TID) | INTRAVENOUS | Status: DC
  Administered 2016-09-25 – 2016-09-26 (×4): 1000 mg via INTRAVENOUS
  Filled 2016-09-24 (×6): qty 200

## 2016-09-24 MED ORDER — ENOXAPARIN SODIUM 40 MG/0.4ML ~~LOC~~ SOLN
40.0000 mg | SUBCUTANEOUS | Status: DC
  Administered 2016-09-24 – 2016-09-25 (×2): 40 mg via SUBCUTANEOUS
  Filled 2016-09-24 (×2): qty 0.4

## 2016-09-24 MED ORDER — DEXTROSE 5 % IV SOLN
2.0000 g | Freq: Once | INTRAVENOUS | Status: AC
  Administered 2016-09-24: 2 g via INTRAVENOUS
  Filled 2016-09-24: qty 2

## 2016-09-24 MED ORDER — ACETAMINOPHEN 325 MG PO TABS
650.0000 mg | ORAL_TABLET | Freq: Four times a day (QID) | ORAL | Status: DC | PRN
  Administered 2016-09-25: 650 mg via ORAL
  Filled 2016-09-24: qty 2

## 2016-09-24 MED ORDER — ACETAMINOPHEN 650 MG RE SUPP: 650.0000 mg | Freq: Four times a day (QID) | RECTAL | Status: DC | PRN

## 2016-09-24 NOTE — ED Triage Notes (Signed)
Left big toe infection was seen before and given meds  And he states he finished  Them now it is draining again , foot has good pulse

## 2016-09-24 NOTE — Progress Notes (Signed)
Pharmacy Antibiotic Note  Douglas Murphy is a 51 y.o. male admitted on 09/24/2016 with redness from foot to leg. Starting antibiotics for osteomyelitis of L great toe.   Pharmacy consulted to start ciprofloxacin along with the vancomycin.  Plan: Start ciprofloxacin 400mg  IV Q12 Start vancomycin 1g IV Q8 Monitor clinical picture, renal function, VT at Css F/U C&S, abx deescalation / LOT   Height: 6' (182.9 cm) Weight: 195 lb (88.5 kg) IBW/kg (Calculated) : 77.6  Temp (24hrs), Avg:97.9 F (36.6 C), Min:97.4 F (36.3 C), Max:98.6 F (37 C)   Recent Labs Lab 09/24/16 1608  WBC 7.3  CREATININE 0.72    Estimated Creatinine Clearance: 123.9 mL/min (by C-G formula based on SCr of 0.72 mg/dL).    Allergies  Allergen Reactions  . Other Anaphylaxis    [place] nuts     Antimicrobials this admission: Ceftriaxone x1 Vancomycin 1/12 >> Ciprofloxacin 1/12 >>  Dose adjustments this admission: N/A  Microbiology results: 1/12 blood cx: sent 1/12 wound cx: pending  Thank you for allowing pharmacy to be a part of this patient's care.  Enzo BiNathan Samad Thon, PharmD, BCPS Clinical Pharmacist Pager [phone] 09/24/2016 9:20 PM

## 2016-09-24 NOTE — ED Provider Notes (Signed)
[facility] Provider Note    By signing my name below, I, Earmon Phoenix, attest that this documentation has been prepared under the direction and in the presence of Melburn Hake, PA-C. Electronically Signed: Earmon Phoenix, ED Scribe. 09/24/16. 3:01 PM.    History   Chief Complaint Chief Complaint  Patient presents with  . Wound Infection   The history is provided by the patient and medical records. No language interpreter was used.    Douglas Murphy is a 51 y.o. male who presents to the Emergency Department needing a wound recheck of the left great toe from a laceration that occurred about five weeks ago. He states he dropped a porcelain cup on the toe and pulled a piece of it out. He was seen here approximately one week after the incident, had the area incised and drained and was prescribed Keflex and Bactrim which he reports finishing about one week ago. He also states he had a dose of IV Ancef at his previous visit as well in the ED prior to d/c. He now reports worsening drainage, redness and swelling of the left great toe. He reports the redness has spread across his great toes. He has not taken anything for pain relief but reports soaking the area and keeping it clean. Pt denies modifying factors. He denies fever, chills, nausea, vomiting, numbness, tingling or weakness of the left foot or LLE. Pt was prescribed Metformin when he was here a month ago due to suspected DM. He reports having an appt with a PCP for a full health screening at the end of the month.      History reviewed. No pertinent past medical history.  Patient Active Problem List   Diagnosis Date Noted  . Osteomyelitis (HCC) 09/24/2016    Past Surgical History:  Procedure Laterality Date  . KNEE ARTHROSCOPY         Home Medications    Prior to Admission medications   Medication Sig Start Date End Date Taking? Authorizing Provider  ibuprofen (ADVIL,MOTRIN) 200 MG tablet Take 400 mg by  mouth every 6 (six) hours as needed for moderate pain.   Yes Historical Provider, MD  metFORMIN (GLUCOPHAGE) 500 MG tablet Take 1 tablet (500 mg total) by mouth 2 (two) times daily with a meal. 08/27/16  Yes Roxy Horseman, PA-C    Family History No family history on file.  Social History Social History  Substance Use Topics  . Smoking status: Never Smoker  . Smokeless tobacco: Never Used  . Alcohol use No     Allergies   Other   Review of Systems Review of Systems  Skin: Positive for wound.     Physical Exam Updated Vital Signs BP 121/81 (BP Location: Left Arm)   Pulse 95   Temp 97.7 F (36.5 C) (Oral)   Resp 18   Ht 6' (1.829 m)   Wt 195 lb (88.5 kg)   SpO2 99%   BMI 26.45 kg/m   Physical Exam  Constitutional: He is oriented to person, place, and time. He appears well-developed and well-nourished.  HENT:  Head: Normocephalic and atraumatic.  Eyes: Conjunctivae and EOM are normal. Right eye exhibits no discharge. Left eye exhibits no discharge. No scleral icterus.  Cardiovascular: Normal rate, regular rhythm, normal heart sounds and intact distal pulses.   Left DP pulse 2+  Pulmonary/Chest: Effort normal and breath sounds normal.  Abdominal: Soft. Bowel sounds are normal. There is no tenderness.  Musculoskeletal: He exhibits tenderness. He exhibits no  edema or deformity.  Moderate swelling, warmth and erythema noted to left great toe extending to midfoot. Tenderness to palpation. 2 open puncture wounds noted to medial aspect of great toe with purulent drainage present. Decreased ROM of left great toe due to swelling. Full ROM of left ankle and knee. Pt able to stand and ambulate.  Neurological: He is alert and oriented to person, place, and time.  Skin: Skin is warm and dry.  Nursing note and vitals reviewed.        ED Treatments / Results  DIAGNOSTIC STUDIES: Oxygen Saturation is 99% on RA, normal by my interpretation.   COORDINATION OF CARE: 2:28  PM- Will order left great toe X-Ray. Will order labs. Pt verbalizes understanding and agrees to plan.  Medications - No data to display  Labs (all labs ordered are listed, but only abnormal results are displayed) Labs Reviewed  CBC WITH DIFFERENTIAL/PLATELET - Abnormal; Notable for the following:       Result Value   Eosinophils Absolute 0.8 (*)    All other components within normal limits  BASIC METABOLIC PANEL - Abnormal; Notable for the following:    Chloride 98 (*)    Glucose, Bld 236 (*)    All other components within normal limits  AEROBIC CULTURE (SUPERFICIAL SPECIMEN)  CULTURE, BLOOD (ROUTINE X 2)  CULTURE, BLOOD (ROUTINE X 2)  I-STAT CG4 LACTIC ACID, ED    EKG  EKG Interpretation None       Radiology Dg Foot Complete Left  Result Date: 09/24/2016 CLINICAL DATA:  Punctured left toe toe is red and swollen EXAM: LEFT FOOT - COMPLETE 3+ VIEW COMPARISON:  08/27/2016 FINDINGS: No fracture or subluxation is visualized. Small plantar calcaneal spur. Interim finding of bony erosive change involving the medial aspect of the distal first proximal phalanx. No radiopaque foreign body is visualized. IMPRESSION: Interim development of bony erosive change involving the distal aspect of the left first proximal phalanx, concerning for osteomyelitis. These results will be called to the ordering clinician or representative by the Radiologist Assistant, and communication documented in the PACS or zVision Dashboard. Electronically Signed   By: Jasmine Pang M.D.   On: 09/24/2016 15:19    Procedures Procedures (including critical care time)  Medications Ordered in ED Medications - No data to display   Initial Impression / Assessment and Plan / ED Course  I have reviewed the triage vital signs and the nursing notes.  Pertinent labs & imaging results that were available during my care of the patient were reviewed by me and considered in my medical decision making (see chart for  details).  Clinical Course     Patient presents with worsening wound to left great toe. He was initially seen in the ED on 12/15 for puncture wound to left great toe, x-ray negative, patient was given IV dose of Ancef and discharged home with Keflex and Bactrim which she reports taking as prescribed until completed. VSS. Exam showed Moderate swelling, warmth and erythema noted to left great toe extending to midfoot. Tenderness to palpation. 2 open puncture wounds noted to medial aspect of great toe with purulent drainage present. Decreased ROM of left great toe due to swelling. Full ROM of left ankle and knee. Pt able to stand and ambulate. Left foot xray showed development of bony erosive change involving the distal aspect of the left first proximal phalanx, concerning for Osteomyelitis. Orders placed for blood cultures, wound culture, labs. Consulted hospitalist for admission. Pt admitted to family medicine  service, Dr. Deirdre Priesthambliss. Orders placed for obs med-surg bed. Hospitalist requested ortho consult. Ortho consult pending, secretary reported orthopedist just scrubbed into surgery, awaiting call back.  Hand-off to United States Steel Corporationicole Pisciotta, PA-C. Discussed pt with provider and plan to notify ortho regarding pt's osteo along with asking about ortho's preference for antibiotics.   I personally performed the services described in this documentation, which was scribed in my presence. The recorded information has been reviewed and is accurate.   Final Clinical Impressions(s) / ED Diagnoses   Final diagnoses:  None    New Prescriptions New Prescriptions   No medications on file     Barrett Henleicole Elizabeth Nadeau, PA-C 09/24/16 1711    Shaune Pollackameron Isaacs, MD 09/25/16 1212

## 2016-09-24 NOTE — ED Notes (Signed)
Pt has failed oral antibiotic therapy and has redness extending from the foot and up the leg.

## 2016-09-24 NOTE — Progress Notes (Signed)
Pharmacy Antibiotic Note  Douglas Murphy is a 51 y.o. male admitted on 09/24/2016 with redness from foot to leg. Starting antibiotics for osteomyelitis of L great toe.    Plan: -Vancomycin 1750 mg IV x1 then 1g/8h -Monitor renal fx, cultures, VT at steady state    Height: 6' (182.9 cm) Weight: 195 lb (88.5 kg) IBW/kg (Calculated) : 77.6  Temp (24hrs), Avg:97.6 F (36.4 C), Min:97.4 F (36.3 C), Max:97.7 F (36.5 C)   Recent Labs Lab 09/24/16 1608  WBC 7.3  CREATININE 0.72    Estimated Creatinine Clearance: 123.9 mL/min (by C-G formula based on SCr of 0.72 mg/dL).    Allergies  Allergen Reactions  . Other Anaphylaxis    Brazilian nuts     Antimicrobials this admission: 1/12 ceftriaxone x1 1/12 vancomycin >  Dose adjustments this admission: N/A  Microbiology results: 1/12 blood cx: 1/12 wound cx:  Thank you for allowing pharmacy to be a part of this patient's care.  Baldemar FridayMasters, Skyllar Notarianni M 09/24/2016 7:29 PM

## 2016-09-24 NOTE — H&P (Signed)
[facility] History and Physical Service Pager: [phone]  Patient name: Douglas RileyWilliam Brian Murphy Medical record number: [id number] Date of birth: 07/22/1966 Age: 51 y.o. Gender: male  Primary Care Provider: No PCP Per Patient Consultants: Orthopedics  Code Status: Full  Chief Complaint: Left great toe infection  Assessment and Plan: Douglas Murphy is a 51 y.o. male presenting with an infection of the left great toe with notable purulence. PMH is significant for minimal medical care, and suspected undiagnosed type 2 diabetes.  # Osteomyelitis, left great toe: Patient presenting with worsening cellulitis and purulent drainage from a puncture wound sustained in November. Patient has completed a full course of Keflex and Bactrim with temporary improvement and subsequent worsening since antibiotic completion. No systemic symptoms at this time. No leukocytosis. Patient is afebrile. X-ray of the affected foot shows erosive changes concerning for osteomyelitis. Will consider Keflex and Bactrim course as failures. - Admit to MedSurg inpatient teaching service; Dr. Deirdre Priesthambliss, attending physician - Vancomycin per pharmacy - Ciprofloxacin per pharmacy (pseudomonal coverage and better bony penetrance than ceftriaxone which was started in the ED) - Awaiting orthopedic recommendations - Follow-up wound culture obtained in ED - Follow-up blood culture, obtained prior to Abx, very low level of suspicion for bacteremia (in case only 1-of-2 tubes is positive) - Tylenol as needed for pain/fever - IV KVO - Consideration for lower extremity MRI - Tight CBG control with SSI  # Suspected type 2 diabetes: Patient has minimal healthcare exposure. Infrequent blood glucose levels are well above the diabetic threshold. Most recent CBGs would be diagnostic for type 2 diabetes however active infection was present making this relatively unreliable. Patient had been started on  metformin at previous visit and endorses good compliance with this medication and minimal side effects. - Obtain A1c - SSI sensitive - Obtain TSH, and lipid panel.   FEN/GI:  Heart healthy/carb modified diet, IV saline lock Prophylaxis: Lovenox  Disposition: pending  History of Present Illness:  Douglas RileyWilliam Brian Murphy is a 51 y.o. male presenting with left great toe cellulitis. Patient states that he was seen back in December for similar issue. The initial injury occurred the week of Thanksgiving. He states that he stepped on a piece of porcelain which punctured the plantar surface of his left great toe. He states that he attempted to clean this wound at home after the injury. It was not until mid-December when he felt he should have this wound looked at by a health care professional. On 08/27/16 he was seen in the ED. X-rays were obtained and the possibility for osteomyelitis was deemed unlikely. Patient was placed on Keflex and Bactrim and sent home.   Patient endorses excellent compliance with his antibiotic regimen. He states that he completed these prescriptions and since that time he has had significant worsening in the pain and swelling in the affected foot. He endorses purulent drainage from the medial aspect of his great toe. Pain and soreness seemed to track up to his mid foot. He denies any systemic symptoms. No fevers, chills, nausea, vomiting, diarrhea, diaphoresis, chest pain, shortness of breath, abdominal pain.  In the ED and x-ray revealed evidence of new osteomyelitis. Patient was started on IV antibiotics. Blood cultures were obtained prior to antibiotic initiation. Patient was admitted. Orthopedics have been consulted but are not yet seen patient.  Review Of Systems: Per HPI  ROS  Patient Active Problem List   Diagnosis Date Noted  . Osteomyelitis (HCC) 09/24/2016    Past  Medical History: History reviewed. No pertinent past medical history.  Past Surgical History: Past  Surgical History:  Procedure Laterality Date  . KNEE ARTHROSCOPY      Social History: Social History  Substance Use Topics  . Smoking status: Never Smoker  . Smokeless tobacco: Never Used  . Alcohol use No   Additional social history: none  Please also refer to relevant sections of EMR.  Family History: No known family history of DM  Allergies and Medications: Allergies  Allergen Reactions  . Other Anaphylaxis    [place] nuts    No current facility-administered medications on file prior to encounter.    Current Outpatient Prescriptions on File Prior to Encounter  Medication Sig Dispense Refill  . ibuprofen (ADVIL,MOTRIN) 200 MG tablet Take 400 mg by mouth every 6 (six) hours as needed for moderate pain.    . metFORMIN (GLUCOPHAGE) 500 MG tablet Take 1 tablet (500 mg total) by mouth 2 (two) times daily with a meal. 60 tablet 1    Objective: BP (!) 136/94 (BP Location: Left Arm)   Pulse 87   Temp 98.6 F (37 C) (Oral)   Resp 18   Ht 6' (1.829 m)   Wt 195 lb (88.5 kg)   SpO2 100%   BMI 26.45 kg/m  Exam: General -- oriented x3, pleasant and cooperative. HEENT -- Head is normocephalic. PERRLA. EOMI. Ears, nose and throat were benign. Neck -- supple; no bruits. Chest -- good expansion. Lungs clear to auscultation. Cardiac -- RRR. No murmurs noted.  Abdomen -- soft, nontender. No masses palpable. Bowel sounds present. CNS -- cranial nerves II through XII grossly intact. 2+ reflexes bilaterally. Extremeties - notable swelling, warmth, and erythema of the left great toe extending along the medial aspect of the foot the midfoot. Tenderness is presence with palpation. Sensation intact throughout. 2 areas of drainage noted along the medial aspect as well. Some purulence and serosanguineous fluid draining from these sites. ROM intact. Strength intact. Capillary refill unremarkable. Pulses present and equal to contralateral side.  Integument: No evidence of abrasions,  lacerations, infections, erythema, or ecchymoses other than stated above.    Labs and Imaging: CBC BMET   Recent Labs Lab 09/24/16 1608  WBC 7.3  HGB 13.3  HCT 39.1  PLT 244    Recent Labs Lab 09/24/16 1608  NA 135  K 3.9  CL 98*  CO2 26  BUN 14  CREATININE 0.72  GLUCOSE 236*  CALCIUM 9.2       Kathee Delton, MD 09/24/2016, 8:38 PM PGY-3, [facility] FPTS Intern pager: [phone], text pages welcome

## 2016-09-24 NOTE — ED Provider Notes (Signed)
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 He is admitted to family practice, afebrile with no white count, likely undiagnosed diabetes, patient has no primary care physician. Admitting team has asked us to consult orthopedics for recommendation on antibiotics.  Orthopedic consult from Dr. Magnus IvanBlackman appreciated he states he has no preference as to antibiotics: He will evaluate the patient in consult when he is out of the OR.  Patient is started him to clean on vancomycin and Rocephin.   Wynetta Emeryicole Zamantha Strebel, PA-C 09/24/16 1858    Shaune Pollackameron Isaacs, MD 09/25/16 [phone]

## 2016-09-25 ENCOUNTER — Encounter (HOSPITAL_COMMUNITY)
Admission: EM | Disposition: A | Discharge status: Home / Self Care | Payer: Self-pay | Source: Home / Self Care | Attending: Family Medicine

## 2016-09-25 ENCOUNTER — Inpatient Hospital Stay (HOSPITAL_COMMUNITY): Payer: Self-pay | Admitting: Anesthesiology

## 2016-09-25 ENCOUNTER — Encounter (HOSPITAL_COMMUNITY): Payer: Self-pay | Admitting: Certified Registered"

## 2016-09-25 DIAGNOSIS — E11621 Type 2 diabetes mellitus with foot ulcer: Secondary | ICD-10-CM

## 2016-09-25 DIAGNOSIS — L97509 Non-pressure chronic ulcer of other part of unspecified foot with unspecified severity: Secondary | ICD-10-CM

## 2016-09-25 HISTORY — PX: AMPUTATION TOE: SHX6595

## 2016-09-25 LAB — CBC
HCT: 37.2 % — ABNORMAL LOW (ref 39.0–52.0)
Hemoglobin: 12.9 g/dL — ABNORMAL LOW (ref 13.0–17.0)
MCH: 29.3 pg (ref 26.0–34.0)
MCHC: 34.7 g/dL (ref 30.0–36.0)
MCV: 84.4 fL (ref 78.0–100.0)
PLATELETS: 240 10*3/uL (ref 150–400)
RBC: 4.41 MIL/uL (ref 4.22–5.81)
RDW: 12.4 % (ref 11.5–15.5)
WBC: 7.1 10*3/uL (ref 4.0–10.5)

## 2016-09-25 LAB — LIPID PANEL
CHOL/HDL RATIO: 3 ratio
Cholesterol: 128 mg/dL (ref 0–200)
HDL: 43 mg/dL (ref >40)
LDL Cholesterol: 52 mg/dL (ref 0–99)
Triglycerides: 167 mg/dL — ABNORMAL HIGH (ref <150)
VLDL: 33 mg/dL (ref 0–40)

## 2016-09-25 LAB — BASIC METABOLIC PANEL
Anion gap: 6 (ref 5–15)
BUN: 14 mg/dL (ref 6–20)
CALCIUM: 8.7 mg/dL — AB (ref 8.9–10.3)
CHLORIDE: 103 mmol/L (ref 101–111)
CO2: 26 mmol/L (ref 22–32)
CREATININE: 0.67 mg/dL (ref 0.61–1.24)
GFR calc Af Amer: >60 mL/min (ref >60)
GFR calc non Af Amer: >60 mL/min (ref >60)
GLUCOSE: 245 mg/dL — AB (ref 65–99)
Potassium: 3.9 mmol/L (ref 3.5–5.1)
Sodium: 135 mmol/L (ref 135–145)

## 2016-09-25 LAB — GLUCOSE, CAPILLARY
GLUCOSE-CAPILLARY: 253 mg/dL — AB (ref 65–99)
GLUCOSE-CAPILLARY: 189 mg/dL — AB (ref 65–99)
GLUCOSE-CAPILLARY: 231 mg/dL — AB (ref 65–99)
Glucose-Capillary: 213 mg/dL — ABNORMAL HIGH (ref 65–99)
Glucose-Capillary: 190 mg/dL — ABNORMAL HIGH (ref 65–99)

## 2016-09-25 LAB — TSH: TSH: 1.399 u[IU]/mL (ref 0.350–4.500)

## 2016-09-25 LAB — SURGICAL PCR SCREEN
MRSA, PCR: NEGATIVE
Staphylococcus aureus: POSITIVE — AB

## 2016-09-25 SURGERY — AMPUTATION, TOE
Anesthesia: General | Site: Leg Lower | Laterality: Left

## 2016-09-25 MED ORDER — OXYCODONE-ACETAMINOPHEN 5-325 MG PO TABS
1.0000 | ORAL_TABLET | ORAL | Status: DC | PRN
  Administered 2016-09-25: 2 via ORAL
  Filled 2016-09-25 (×3): qty 2

## 2016-09-25 MED ORDER — INSULIN ASPART 100 UNIT/ML ~~LOC~~ SOLN
0.0000 [IU] | SUBCUTANEOUS | Status: DC
  Administered 2016-09-25: 2 [IU] via SUBCUTANEOUS
  Administered 2016-09-25: 3 [IU] via SUBCUTANEOUS
  Administered 2016-09-26: 2 [IU] via SUBCUTANEOUS
  Administered 2016-09-26 (×2): 3 [IU] via SUBCUTANEOUS

## 2016-09-25 MED ORDER — ONDANSETRON HCL 4 MG/2ML IJ SOLN: 4.0000 mg | Freq: Once | INTRAMUSCULAR | Status: DC | PRN

## 2016-09-25 MED ORDER — PROPOFOL 10 MG/ML IV BOLUS
INTRAVENOUS | Status: DC | PRN
  Administered 2016-09-25: 180 mg via INTRAVENOUS

## 2016-09-25 MED ORDER — FENTANYL CITRATE (PF) 100 MCG/2ML IJ SOLN: 25.0000 ug | INTRAMUSCULAR | Status: DC | PRN

## 2016-09-25 MED ORDER — ONDANSETRON HCL 4 MG/2ML IJ SOLN
INTRAMUSCULAR | Status: DC | PRN
  Administered 2016-09-25: 4 mg via INTRAVENOUS

## 2016-09-25 MED ORDER — LACTATED RINGERS IV SOLN
INTRAVENOUS | Status: DC | PRN
  Administered 2016-09-25: 11:00:00 via INTRAVENOUS

## 2016-09-25 MED ORDER — FENTANYL CITRATE (PF) 100 MCG/2ML IJ SOLN
INTRAMUSCULAR | Status: DC | PRN
  Administered 2016-09-25: 50 ug via INTRAVENOUS

## 2016-09-25 MED ORDER — MIDAZOLAM HCL 2 MG/2ML IJ SOLN
INTRAMUSCULAR | Status: AC
  Filled 2016-09-25: qty 2

## 2016-09-25 MED ORDER — ONDANSETRON HCL 4 MG/2ML IJ SOLN
INTRAMUSCULAR | Status: AC
  Filled 2016-09-25: qty 2

## 2016-09-25 MED ORDER — SODIUM CHLORIDE 0.9 % IV SOLN: 250.0000 mL | INTRAVENOUS | Status: DC | PRN

## 2016-09-25 MED ORDER — MIDAZOLAM HCL 5 MG/5ML IJ SOLN
INTRAMUSCULAR | Status: DC | PRN
  Administered 2016-09-25: 2 mg via INTRAVENOUS

## 2016-09-25 MED ORDER — 0.9 % SODIUM CHLORIDE (POUR BTL) OPTIME
TOPICAL | Status: DC | PRN
  Administered 2016-09-25: 1000 mL

## 2016-09-25 MED ORDER — INFLUENZA VAC SPLIT QUAD 0.5 ML IM SUSY
0.5000 mL | PREFILLED_SYRINGE | INTRAMUSCULAR | Status: AC
Start: 2016-09-26 — End: 2016-09-26
  Administered 2016-09-26: 0.5 mL via INTRAMUSCULAR
  Filled 2016-09-25: qty 0.5

## 2016-09-25 MED ORDER — LIDOCAINE 2% (20 MG/ML) 5 ML SYRINGE
INTRAMUSCULAR | Status: DC | PRN
  Administered 2016-09-25: 60 mg via INTRAVENOUS

## 2016-09-25 MED ORDER — MENTHOL 3 MG MT LOZG
1.0000 | LOZENGE | OROMUCOSAL | Status: DC | PRN
  Administered 2016-09-25: 3 mg via ORAL
  Filled 2016-09-25: qty 9

## 2016-09-25 MED ORDER — FENTANYL CITRATE (PF) 100 MCG/2ML IJ SOLN
INTRAMUSCULAR | Status: AC
  Filled 2016-09-25: qty 2

## 2016-09-25 SURGICAL SUPPLY — 51 items
BANDAGE GAUZE 4  KLING STR (GAUZE/BANDAGES/DRESSINGS) IMPLANT
BLADE AVERAGE 25X9 (BLADE) IMPLANT
BLADE MINI RND TIP GREEN BEAV (BLADE) IMPLANT
BNDG COHESIVE 1X5 TAN STRL LF (GAUZE/BANDAGES/DRESSINGS) IMPLANT
BNDG COHESIVE 3X5 TAN STRL LF (GAUZE/BANDAGES/DRESSINGS) ×2 IMPLANT
BNDG COHESIVE 6X5 TAN STRL LF (GAUZE/BANDAGES/DRESSINGS) IMPLANT
BNDG CONFORM 2 STRL LF (GAUZE/BANDAGES/DRESSINGS) ×2 IMPLANT
BNDG CONFORM 3 STRL LF (GAUZE/BANDAGES/DRESSINGS) ×2 IMPLANT
BNDG ESMARK 4X9 LF (GAUZE/BANDAGES/DRESSINGS) ×2 IMPLANT
BNDG GAUZE STRTCH 6 (GAUZE/BANDAGES/DRESSINGS) IMPLANT
CORDS BIPOLAR (ELECTRODE) ×2 IMPLANT
COVER SURGICAL LIGHT HANDLE (MISCELLANEOUS) ×2 IMPLANT
CUFF TOURNIQUET SINGLE 18IN (TOURNIQUET CUFF) IMPLANT
CUFF TOURNIQUET SINGLE 24IN (TOURNIQUET CUFF) IMPLANT
CUFF TOURNIQUET SINGLE 34IN LL (TOURNIQUET CUFF) IMPLANT
DRAPE U-SHAPE 47X51 STRL (DRAPES) ×2 IMPLANT
DURAPREP 26ML APPLICATOR (WOUND CARE) ×2 IMPLANT
ELECT REM PT RETURN 9FT ADLT (ELECTROSURGICAL) ×2
ELECTRODE REM PT RTRN 9FT ADLT (ELECTROSURGICAL) ×1 IMPLANT
GAUZE SPONGE 2X2 8PLY STRL LF (GAUZE/BANDAGES/DRESSINGS) IMPLANT
GAUZE SPONGE 4X4 12PLY STRL (GAUZE/BANDAGES/DRESSINGS) IMPLANT
GAUZE XEROFORM 1X8 LF (GAUZE/BANDAGES/DRESSINGS) ×4 IMPLANT
GLOVE BIO SURGEON STRL SZ8 (GLOVE) ×2 IMPLANT
GLOVE BIOGEL PI IND STRL 8 (GLOVE) ×1 IMPLANT
GLOVE BIOGEL PI INDICATOR 8 (GLOVE) ×1
GLOVE ORTHO TXT STRL SZ7.5 (GLOVE) ×2 IMPLANT
GOWN STRL REUS W/ TWL LRG LVL3 (GOWN DISPOSABLE) ×1 IMPLANT
GOWN STRL REUS W/ TWL XL LVL3 (GOWN DISPOSABLE) ×4 IMPLANT
GOWN STRL REUS W/TWL LRG LVL3 (GOWN DISPOSABLE) ×1
GOWN STRL REUS W/TWL XL LVL3 (GOWN DISPOSABLE) ×4
KIT BASIN OR (CUSTOM PROCEDURE TRAY) ×2 IMPLANT
KIT ROOM TURNOVER OR (KITS) ×2 IMPLANT
MANIFOLD NEPTUNE II (INSTRUMENTS) ×2 IMPLANT
NEEDLE HYPO 25GX1X1/2 BEV (NEEDLE) IMPLANT
NS IRRIG 1000ML POUR BTL (IV SOLUTION) ×2 IMPLANT
PACK ORTHO EXTREMITY (CUSTOM PROCEDURE TRAY) ×2 IMPLANT
PAD ARMBOARD 7.5X6 YLW CONV (MISCELLANEOUS) ×4 IMPLANT
PAD CAST 4YDX4 CTTN HI CHSV (CAST SUPPLIES) ×1 IMPLANT
PADDING CAST COTTON 4X4 STRL (CAST SUPPLIES) ×1
SPECIMEN JAR SMALL (MISCELLANEOUS) ×2 IMPLANT
SPONGE GAUZE 2X2 STER 10/PKG (GAUZE/BANDAGES/DRESSINGS)
SPONGE GAUZE 4X4 12PLY STER LF (GAUZE/BANDAGES/DRESSINGS) ×2 IMPLANT
SUCTION FRAZIER HANDLE 10FR (MISCELLANEOUS)
SUCTION TUBE FRAZIER 10FR DISP (MISCELLANEOUS) IMPLANT
SUT ETHILON 2 0 FS 18 (SUTURE) IMPLANT
SUT VIC AB 2-0 FS1 27 (SUTURE) IMPLANT
SYR CONTROL 10ML LL (SYRINGE) IMPLANT
TOWEL OR 17X24 6PK STRL BLUE (TOWEL DISPOSABLE) ×2 IMPLANT
TOWEL OR 17X26 10 PK STRL BLUE (TOWEL DISPOSABLE) ×2 IMPLANT
TUBE CONNECTING 12X1/4 (SUCTIONS) IMPLANT
WATER STERILE IRR 1000ML POUR (IV SOLUTION) ×2 IMPLANT

## 2016-09-25 NOTE — Anesthesia Procedure Notes (Signed)
Procedure Name: LMA Insertion Date/Time: 09/25/2016 11:44 AM Performed by: Charm BargesBUTLER, Tnya Ades R Pre-anesthesia Checklist: Patient identified, Emergency Drugs available, Suction available and Patient being monitored Patient Re-evaluated:Patient Re-evaluated prior to inductionOxygen Delivery Method: Circle System Utilized Preoxygenation: Pre-oxygenation with 100% oxygen Intubation Type: IV induction Ventilation: Mask ventilation without difficulty LMA: LMA inserted LMA Size: 5.0 Number of attempts: 1 Placement Confirmation: positive ETCO2 Tube secured with: Tape Dental Injury: Teeth and Oropharynx as per pre-operative assessment

## 2016-09-25 NOTE — Anesthesia Preprocedure Evaluation (Addendum)
Anesthesia Evaluation  Patient identified by MRN, date of birth, ID band Patient awake    Reviewed: Allergy & Precautions, NPO status , Patient's Chart, lab work & pertinent test results  Airway Mallampati: I  TM Distance: >3 FB Neck ROM: Full    Dental  (+) Teeth Intact, Dental Advisory Given   Pulmonary    breath sounds clear to auscultation       Cardiovascular  Rhythm:Regular Rate:Normal     Neuro/Psych    GI/Hepatic   Endo/Other  diabetes, Type 2, Oral Hypoglycemic Agents  Renal/GU      Musculoskeletal   Abdominal   Peds  Hematology   Anesthesia Other Findings   Reproductive/Obstetrics                            Anesthesia Physical Anesthesia Plan  ASA: III  Anesthesia Plan: General   Post-op Pain Management:    Induction: Intravenous  Airway Management Planned: LMA  Additional Equipment:   Intra-op Plan:   Post-operative Plan:   Informed Consent: I have reviewed the patients History and Physical, chart, labs and discussed the procedure including the risks, benefits and alternatives for the proposed anesthesia with the patient or authorized representative who has indicated his/her understanding and acceptance.   Dental advisory given  Plan Discussed with: CRNA, Anesthesiologist and Surgeon  Anesthesia Plan Comments:        Anesthesia Quick Evaluation

## 2016-09-25 NOTE — Anesthesia Postprocedure Evaluation (Addendum)
Anesthesia Post Note  Patient: Douglas RileyWilliam Brian Murphy  Procedure(s) Performed: Procedure(s) (LRB): AMPUTATION GREAT TOE (Left)  Patient location during evaluation: PACU Anesthesia Type: General Level of consciousness: awake, awake and alert and oriented Pain management: pain level controlled Vital Signs Assessment: post-procedure vital signs reviewed and stable Respiratory status: spontaneous breathing, nonlabored ventilation and respiratory function stable Cardiovascular status: blood pressure returned to baseline Anesthetic complications: no       Last Vitals:  Vitals:   09/25/16 1231 09/25/16 1236  BP: (!) 125/92   Pulse: 84   Resp: 13   Temp:  36.5 C    Last Pain:  Vitals:   09/25/16 0518  TempSrc: Oral  PainSc:                  Estha Few COKER

## 2016-09-25 NOTE — Progress Notes (Signed)
Patient ID: Douglas RileyWilliam Brian Murphy, male   DOB: 05/18/1966, 51 y.o.   MRN: [id number] The patient has now decided to consent to surgery to remove his left great toe secondary to infection/osteomyelitis.  Risk and benefits discussed.  Will work on getting him on the OR schedule sometime today.

## 2016-09-25 NOTE — Progress Notes (Signed)
Patient sitting up in bed and eating after L great toe amputation. He reports pain is currently well controlled. He had visitors and no present concerns.

## 2016-09-25 NOTE — Transfer of Care (Signed)
Immediate Anesthesia Transfer of Care Note  Patient: Douglas Murphy  Procedure(s) Performed: Procedure(s): AMPUTATION GREAT TOE (Left)  Patient Location: PACU  Anesthesia Type:General  Level of Consciousness: awake, oriented and patient cooperative  Airway & Oxygen Therapy: Patient Spontanous Breathing and Patient connected to nasal cannula oxygen  Post-op Assessment: Report given to RN, Post -op Vital signs reviewed and stable and Patient moving all extremities  Post vital signs: Reviewed and stable  Last Vitals:  Vitals:   09/24/16 2025 09/25/16 0518  BP: (!) 136/94 104/70  Pulse: 87 80  Resp: 18 17  Temp: 37 C 36.6 C    Last Pain:  Vitals:   09/25/16 0518  TempSrc: Oral  PainSc:          Complications: No apparent anesthesia complications

## 2016-09-25 NOTE — Progress Notes (Signed)
Orthopedic Tech Progress Note Patient Details:  Douglas RileyWilliam Brian Murphy 06/09/1966 [id number]  Ortho Devices Type of Ortho Device: Postop shoe/boot Ortho Device/Splint Interventions: Application   [name] 09/25/2016, 5:28 PM

## 2016-09-25 NOTE — Discharge Instructions (Signed)
You may put all of your weight on your left foot in your post-op shoe only. Keep your left foot dressing clean and dry. Leave that dressing on and in place until your follow-up appointment

## 2016-09-25 NOTE — Plan of Care (Addendum)
Problem: Safety: Goal: Ability to remain free from injury will improve Outcome: Progressing Safety precautions and fall reduction maintained. No fall or injuries noted on this shift.  Problem: Pain Managment: Goal: General experience of comfort will improve Outcome: Progressing No complaints of pain or discomfort this shift  Problem: Physical Regulation: Goal: Ability to maintain clinical measurements within normal limits will improve Outcome: Progressing Afebrile and BP and P WNL  Problem: Tissue Perfusion: Goal: Risk factors for ineffective tissue perfusion will decrease Outcome: Progressing No sign and  symptoms of DVT.  Problem: Bowel/Gastric: Goal: Will not experience complications related to bowel motility Outcome: Progressing Denies Gastric and Bowels issues

## 2016-09-25 NOTE — Consult Note (Signed)
Reason for Consult: Osteomyelitis left great toe Referring Physician: Family medicine teaching service  Douglas Murphy is an 51 y.o. male.  HPI: The patient is a 51 year old male with diabetes who presented to the emergency room last evening with worsening swelling and drainage from his left great toe. Apparently he developed a wound on that toe about a month ago and was seen in the emergency room and started on antibiotics. He presented yesterday after noting drainage from the left great toe as well as increased swelling and redness. X-rays were obtained of the left foot and is deathly concerning for osteomyelitis of the proximal phalanx of the left great toe. He was admitted and started on IV antibiotics. Orthopedic surgery is consult at to address the left great toe osteomyelitis. The patient reports only mild left foot pain. 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 He does report problems with shoe wear recently.  History reviewed. No pertinent past medical history.  Past Surgical History:  Procedure Laterality Date  . KNEE ARTHROSCOPY      No family history on file.  Social History:  reports that he has never smoked. He has never used smokeless tobacco. He reports that he does not drink alcohol or use drugs.  Allergies:  Allergies  Allergen Reactions  . Other Anaphylaxis    [place] nuts     Medications: I have reviewed the patient's current medications.  Results for orders placed or performed during the hospital encounter of 09/24/16 (from the past 48 hour(s))  Wound or Superficial Culture     Status: None (Preliminary result)   Collection Time: 09/24/16  2:26 PM  Result Value Ref Range   Specimen Description WOUND LEFT FOOT    Special Requests NONE    Gram Stain      ABUNDANT WBC PRESENT, PREDOMINANTLY PMN NO ORGANISMS SEEN    Culture PENDING    Report Status PENDING   CBC with Differential     Status: Abnormal   Collection Time: 09/24/16  4:08 PM  Result Value Ref  Range   WBC 7.3 4.0 - 10.5 K/uL   RBC 4.58 4.22 - 5.81 MIL/uL   Hemoglobin 13.3 13.0 - 17.0 g/dL   HCT 39.1 39.0 - 52.0 %   MCV 85.4 78.0 - 100.0 fL   MCH 29.0 26.0 - 34.0 pg   MCHC 34.0 30.0 - 36.0 g/dL   RDW 12.5 11.5 - 15.5 %   Platelets 244 150 - 400 K/uL   Neutrophils Relative % 47 %   Neutro Abs 3.4 1.7 - 7.7 K/uL   Lymphocytes Relative 33 %   Lymphs Abs 2.4 0.7 - 4.0 K/uL   Monocytes Relative 10 %   Monocytes Absolute 0.8 0.1 - 1.0 K/uL   Eosinophils Relative 10 %   Eosinophils Absolute 0.8 (H) 0.0 - 0.7 K/uL   Basophils Relative 0 %   Basophils Absolute 0.0 0.0 - 0.1 K/uL  Basic metabolic panel     Status: Abnormal   Collection Time: 09/24/16  4:08 PM  Result Value Ref Range   Sodium 135 135 - 145 mmol/L   Potassium 3.9 3.5 - 5.1 mmol/L   Chloride 98 (L) 101 - 111 mmol/L   CO2 26 22 - 32 mmol/L   Glucose, Bld 236 (H) 65 - 99 mg/dL   BUN 14 6 - 20 mg/dL   Creatinine, Ser 0.72 0.61 - 1.24 mg/dL   Calcium 9.2 8.9 - 10.3 mg/dL   GFR calc non Af Amer >60 >  60 mL/min   GFR calc Af Amer >60 >60 mL/min    Comment: (NOTE) The eGFR has been calculated using the CKD EPI equation. This calculation has not been validated in all clinical situations. eGFR's persistently <60 mL/min signify possible Chronic Kidney Disease.    Anion gap 11 5 - 15  Glucose, capillary     Status: Abnormal   Collection Time: 09/24/16 10:17 PM  Result Value Ref Range   Glucose-Capillary 168 (H) 65 - 99 mg/dL   Comment 1 Notify RN    Comment 2 Document in Chart   Basic metabolic panel     Status: Abnormal   Collection Time: 09/25/16  6:21 AM  Result Value Ref Range   Sodium 135 135 - 145 mmol/L   Potassium 3.9 3.5 - 5.1 mmol/L   Chloride 103 101 - 111 mmol/L   CO2 26 22 - 32 mmol/L   Glucose, Bld 245 (H) 65 - 99 mg/dL   BUN 14 6 - 20 mg/dL   Creatinine, Ser 0.67 0.61 - 1.24 mg/dL   Calcium 8.7 (L) 8.9 - 10.3 mg/dL   GFR calc non Af Amer >60 >60 mL/min   GFR calc Af Amer >60 >60 mL/min     Comment: (NOTE) The eGFR has been calculated using the CKD EPI equation. This calculation has not been validated in all clinical situations. eGFR's persistently <60 mL/min signify possible Chronic Kidney Disease.    Anion gap 6 5 - 15  CBC     Status: Abnormal   Collection Time: 09/25/16  6:21 AM  Result Value Ref Range   WBC 7.1 4.0 - 10.5 K/uL   RBC 4.41 4.22 - 5.81 MIL/uL   Hemoglobin 12.9 (L) 13.0 - 17.0 g/dL   HCT 37.2 (L) 39.0 - 52.0 %   MCV 84.4 78.0 - 100.0 fL   MCH 29.3 26.0 - 34.0 pg   MCHC 34.7 30.0 - 36.0 g/dL   RDW 12.4 11.5 - 15.5 %   Platelets 240 150 - 400 K/uL  Lipid panel     Status: Abnormal   Collection Time: 09/25/16  6:21 AM  Result Value Ref Range   Cholesterol 128 0 - 200 mg/dL   Triglycerides 167 (H) <150 mg/dL   HDL 43 >40 mg/dL   Total CHOL/HDL Ratio 3.0 RATIO   VLDL 33 0 - 40 mg/dL   LDL Cholesterol 52 0 - 99 mg/dL    Comment:        Total Cholesterol/HDL:CHD Risk Coronary Heart Disease Risk Table                     Men   Women  1/2 Average Risk   3.4   3.3  Average Risk       5.0   4.4  2 X Average Risk   9.6   7.1  3 X Average Risk  23.4   11.0        Use the calculated Patient Ratio above and the CHD Risk Table to determine the patient's CHD Risk.        ATP III CLASSIFICATION (LDL):  <100     mg/dL   Optimal  100-129  mg/dL   Near or Above                    Optimal  130-159  mg/dL   Borderline  160-189  mg/dL   High  >190     mg/dL   Very  High   Glucose, capillary     Status: Abnormal   Collection Time: 09/25/16  6:31 AM  Result Value Ref Range   Glucose-Capillary 253 (H) 65 - 99 mg/dL   Comment 1 Notify RN    Comment 2 Document in Chart     Dg Foot Complete Left  Result Date: 09/24/2016 CLINICAL DATA:  Punctured left toe toe is red and swollen EXAM: LEFT FOOT - COMPLETE 3+ VIEW COMPARISON:  08/27/2016 FINDINGS: No fracture or subluxation is visualized. Small plantar calcaneal spur. Interim finding of bony erosive change  involving the medial aspect of the distal first proximal phalanx. No radiopaque foreign body is visualized. IMPRESSION: Interim development of bony erosive change involving the distal aspect of the left first proximal phalanx, concerning for osteomyelitis. These results will be called to the ordering clinician or representative by the Radiologist Assistant, and communication documented in the PACS or zVision Dashboard. Electronically Signed   By: Donavan Foil M.D.   On: 09/24/2016 15:19    ROS Blood pressure 104/70, pulse 80, temperature 97.9 F (36.6 C), temperature source Oral, resp. rate 17, height 6' (1.829 m), weight 195 lb (88.5 kg), SpO2 98 %. Physical Exam  Constitutional: He is oriented to person, place, and time. He appears well-developed and well-nourished.  Musculoskeletal:       Feet:  Neurological: He is alert and oriented to person, place, and time.    Assessment/Plan: Osteomyelitis of the left great toe  I spoke to the patient in length and have recommended and rotation of the left great toe. I explained in detail that this type of infection does not respond well to antibiotics once the infection gets in the bone. I talked to him in length and detail about this. I told him that my recommendation was a great toe amputation and he seems to understand this however he says he has too many things that he's get to do at home prior to setting up any type of surgery. In spite of my recommendation, he is declining to have any surgery on his left foot this weekend. I told him I opinion about this and my worry that his infection can worsen. He is still adamant on not having surgery this weekend. With that being said I would recommend discharging him on oral antibiotics such as 300 mg of clindamycin 3 times daily. I gave him my card and I have his home number. I let him know that I would set up surgery as an outpatient for sometime next week. Again I expressed my concern that this will worsen  without definitive surgery. He expresses his concern about the ability to pay due to his limited financial means. I will leave him with follow-up instructions and my number. I really have no further recommendations and talked about diabetic compliance. He understands if he worsens in any way he needs to come back to the hospital into the emergency room but otherwise we will be in touch for setting up surgery for next week as an outpatient.  Douglas Murphy 09/25/2016, 7:36 AM

## 2016-09-25 NOTE — Progress Notes (Signed)
Family Medicine Teaching Service Daily Progress Note Intern Pager: [phone]  Patient name: Douglas Murphy Medical record number: [id number] Date of birth: 02-06-1966 Age: 51 y.o. Gender: male  Primary Care Provider: No PCP Per Patient Consultants: Orthopedics Code Status: Full  Pt Overview and Major Events to Date:  1/12: Admitted for worsening left great toe infection with evidence of osteomyelitis 1/13: Seen by orthopedics who recommended urgent amputation of the affected digit  Assessment and Plan: Wynton Hufstetler is a 51 y.o. male presenting with an infection of the left great toe with notable purulence. PMH is significant for minimal medical care, and suspected undiagnosed type 2 diabetes.  # Osteomyelitis, left great toe: worsening cellulitis and purulent drainage from a puncture wound sustained in November. Failed course of Keflex and Bactrim. No systemic symptoms of infection. No leukocytosis. Afebrile. X-ray shows erosive changes c/w osteomyelitis. - Vancomycin per pharmacy - Ciprofloxacin per pharmacy (pseudomonal coverage and better bony penetrance than ceftriaxone which was started in the ED) - Orthopedic recs >> amputation of the affected digit  - Patient initially refused, but later decided to move forward with amputation  - Ortho to bring into OR later today, 1/13  - Patient made NPO; states that he has not eaten since approximately midnight lastnight - Follow-up wound culture obtained in ED - Follow-up blood culture, obtained prior to Abx, very low level of suspicion for bacteremia (in case only 1-of-2 tubes is positive) - Tylenol as needed for pain/fever - IV KVO >> increased to MIVF now that NPO - Tight CBG control with SSI  # Suspected type 2 diabetes: Patient has minimal healthcare exposure. Infrequent blood glucose levels are well above the diabetic threshold. Most recent CBGs would be diagnostic for type 2 diabetes however active infection was present  making this relatively unreliable. Patient had been started on metformin at previous visit and endorses good compliance with this medication and minimal side effects. - Obtain A1c >> pending - SSI sensitive - TSH (wnl); lipid panel (relatively unremarkable)   FEN/GI:  NPO until after procedure, MIVF (NS for now, change to D5NS if CBGs drop) Prophylaxis: Lovenox  Disposition: pending  Subjective:  Feels well. No issues overnight. Initially refused amputation, but stated that after discussing this issue with his mother he had changed his mind. Patient states his understanding of the necessity of this procedure and is willing to move forth later today once orthopedics has an available OR suite.  Objective: Temp:  [97.4 F (36.3 C)-98.6 F (37 C)] 97.9 F (36.6 C) (01/13 0518) Pulse Rate:  [80-95] 80 (01/13 0518) Resp:  [16-18] 17 (01/13 0518) BP: (104-139)/(70-94) 104/70 (01/13 0518) SpO2:  [98 %-100 %] 98 % (01/13 0518) Weight:  [195 lb (88.5 kg)] 195 lb (88.5 kg) (01/12 1322) Physical Exam: General -- oriented x3, pleasant and cooperative. Chest -- good expansion. Lungs clear to auscultation. Cardiac -- RRR. No murmurs noted.  Extremeties - notable swelling, warmth, and erythema of the left great toe extending along the medial aspect of the foot the midfoot. Tenderness is presence with palpation. Sensation intact throughout. 2 areas of drainage noted along the medial aspect as well. Some purulence and serosanguineous fluid draining from these sites. ROM intact. Strength intact. Capillary refill unremarkable. Pulses present and equal to contralateral side. (unchanged from previous exam) Integument: No evidence of abrasions, lacerations, infections, erythema, or ecchymoses other than stated above.   Laboratory:  Recent Labs Lab 09/24/16 1608 09/25/16 0621  WBC 7.3 7.1  HGB 13.3  12.9*  HCT 39.1 37.2*  PLT 244 240    Recent Labs Lab 09/24/16 1608 09/25/16 0621  NA 135 135   K 3.9 3.9  CL 98* 103  CO2 26 26  BUN 14 14  CREATININE 0.72 0.67  CALCIUM 9.2 8.7*  GLUCOSE 236* 245*    Imaging/Diagnostic Tests: X-ray foot, left: IMPRESSION: Interim development of bony erosive change involving the distal aspect of the left first proximal phalanx, concerning for Osteomyelitis.   Kathee DeltonIan D Sharyn Brilliant, MD 09/25/2016, 9:34 AM PGY-3, [facility] FPTS Intern pager: [phone], text pages welcome

## 2016-09-25 NOTE — Brief Op Note (Signed)
09/24/2016 - 09/25/2016  12:10 PM  PATIENT:  Douglas Murphy  51 y.o. male  PRE-OPERATIVE DIAGNOSIS:  OSTEOMYELITIS LEFT GREAT TOE  POST-OPERATIVE DIAGNOSIS:  OSTEOMYELITIS LEFT GREAT TOE  PROCEDURE:  Procedure(s): AMPUTATION GREAT TOE (Left)  SURGEON:  Surgeon(s) and Role:    * Kathryne Hitchhristopher Y Atley Scarboro, MD - Primary  ANESTHESIA:   general  EBL:  Total I/O In: 650 [I.V.:650] Out: 25 [Blood:25]  COUNTS:  YES  TOURNIQUET:   Total Tourniquet Time Documented: Thigh (Left) - 8 minutes Total: Thigh (Left) - 8 minutes   DICTATION: .Other Dictation: Dictation Number [id number]  PLAN OF CARE: Admit to inpatient   PATIENT DISPOSITION:  PACU - hemodynamically stable.   Delay start of Pharmacological VTE agent (>24hrs) due to surgical blood loss or risk of bleeding: not applicable

## 2016-09-25 NOTE — Progress Notes (Signed)
Patient admitted from ED.

## 2016-09-26 ENCOUNTER — Encounter (HOSPITAL_COMMUNITY): Payer: Self-pay | Admitting: Orthopaedic Surgery

## 2016-09-26 LAB — GLUCOSE, CAPILLARY
GLUCOSE-CAPILLARY: 235 mg/dL — AB (ref 65–99)
GLUCOSE-CAPILLARY: 238 mg/dL — AB (ref 65–99)
Glucose-Capillary: 175 mg/dL — ABNORMAL HIGH (ref 65–99)
Glucose-Capillary: 202 mg/dL — ABNORMAL HIGH (ref 65–99)

## 2016-09-26 LAB — HEMOGLOBIN A1C
HEMOGLOBIN A1C: 12.4 % — AB (ref 4.8–5.6)
MEAN PLASMA GLUCOSE: 309 mg/dL

## 2016-09-26 MED ORDER — CEPHALEXIN 500 MG PO CAPS: 500.0000 mg | ORAL_CAPSULE | Freq: Four times a day (QID) | ORAL | 0 refills | Status: DC

## 2016-09-26 NOTE — Progress Notes (Signed)
Discharge instructions and prescription provided to the patient.  No questions at time of discharge.  IV removed.  Post op boot applied.  Patient dressed and awaiting ride.

## 2016-09-26 NOTE — Progress Notes (Signed)
Inpatient Diabetes Program Recommendations  AACE/ADA: New Consensus Statement on Inpatient Glycemic Control (2015)  Target Ranges:  Prepandial:   less than 140 mg/dL      Peak postprandial:   less than 180 mg/dL (1-2 hours)      Critically ill patients:  140 - 180 mg/dL   Lab Results  Component Value Date   GLUCAP 235 (H) 09/26/2016   HGBA1C 12.4 (H) 09/25/2016    Review of Glycemic Control  Diabetes history: New-onset DM Outpatient Diabetes medications: metformin 500 mg bid - just started Current orders for Inpatient glycemic control: Novolog 0-9 units Q4H  Per pt, "I really haven't been diagnosed with diabetes. They just put me on metformin several weeks ago." Taking metformin as directed.  HgbA1C - 12.4%. To see Dr. Waynard EdwardsPerini next month. Has appt set up.  Spoke with pt regarding his HgbA1C, what it meant, and importance of improving his glucose control. Has meter at home, but ran out of strips. States he will buy more strips when he discharges. Instructed pt to check blood sugars 2-3x/day at different times, and take logbook and meter to appt with Dr. Waynard EdwardsPerini. Explained to pt importance of controlling blood sugars as this contributes to the healing process. Answered questions and emphasized importance of f/u with PCP. Instructed pt to go to Urgent Care if blood sugars remain above 250 mg/dL since he does not have appt until Feb 2018 with Dr. Waynard EdwardsPerini.  Thank you. Ailene Ardshonda Sherma Vanmetre, RD, LDN, CDE Inpatient Diabetes Coordinator [phone]

## 2016-09-26 NOTE — Progress Notes (Signed)
Patient ID: Douglas RileyWilliam Brian Murphy, male   DOB: 11/03/1965, 51 y.o.   MRN: [id number] Tolerated surgery well yesterday.  Removed the left great toe secondary to osteomyelitis.  New dressing placed on left foot toady.  Can be discharged today from an Ortho standpoint.  Would send him out on keflex orally.

## 2016-09-26 NOTE — Discharge Summary (Signed)
Family Medicine Teaching Service [facility] Discharge Summary  Patient name: Douglas Murphy Medical record number: [id number] Date of birth: Nov 15, 1965 Age: 51 y.o. Gender: male Date of Admission: 09/24/2016  Date of Discharge:09/26/2016 Admitting Physician: Carney Living, MD  Primary Care Provider: [name], MD [facility]) Consultants: Orthopedic Surgery  Indication for Hospitalization: Left toe osteomyelitis  Discharge Diagnoses/Problem List:  Left toe amputation secondary to osteomyelitis  Disposition: Home  Discharge Condition: Stable  Discharge Exam:  General: NAD, pleasant, able to participate in exam Cardiac: RRR, normal heart sounds, no murmurs. 2+ radial and PT pulses bilaterally Respiratory: CTAB, normal effort, No wheezes, rales or rhonchi Abdomen: soft, nontender, nondistended, no hepatic or splenomegaly, +BS Extremities: Left foot wrapped s/p amputation, minimal tenderness with palpation, no er Skin: warm and dry, no rashes noted Neuro: alert and oriented x4, no focal deficits Psych: Normal affect and mood   Brief Hospital Course: Demitri Kucinski is a 51 y.o. male with a past medical history significant for DM2 presenting with an infection of the left great toe with notable purulence. Patient was previously on bactrim and keflex for left toe cellulitis in November. On this admission patient without leukocytosis, and afebrile. Imaging of the toe showed erosive changes to the bone concerning for osteomyelitis. Patient was started on vancomyin and ciprofloxacin.Orhtopedic surgery was consulted and recommended left great toe amputation given bone involvement and prior failed medical management. Patient initially reluctant but eventually agree to surgery. Patient underwent left great toes amputation on 1/13 by Dr.Blackman. Patient did well post op with no fever, leukocytosis. Pain was well controlled and he was tolerating po. Patient was  discharged home with a walking boot and keflex with close follow up with orthopedic surgery and PCP. On discharge day patient was stable.   Issues for Follow Up:  1. Patient was discharged on Keflex 500 mg tid for 8 days. Start 1/14-End 11/22. 2.Establishing care with a PCP, patient need management of chronic medical problems (i.e. DM2) A1c was 12.4. 3. Follow up with orthopedic surgery to ensure healing wound is healing as expected.  Significant Procedures: Left toe amputation   Significant Labs and Imaging:   Recent Labs Lab 09/24/16 1608 09/25/16 0621  WBC 7.3 7.1  HGB 13.3 12.9*  HCT 39.1 37.2*  PLT 244 240    Recent Labs Lab 09/24/16 1608 09/25/16 0621  NA 135 135  K 3.9 3.9  CL 98* 103  CO2 26 26  GLUCOSE 236* 245*  BUN 14 14  CREATININE 0.72 0.67  CALCIUM 9.2 8.7*   Dg Foot Complete Left  Result Date: 09/24/2016 CLINICAL DATA:  Punctured left toe toe is red and swollen EXAM: LEFT FOOT - COMPLETE 3+ VIEW COMPARISON:  08/27/2016 FINDINGS: No fracture or subluxation is visualized. Small plantar calcaneal spur. Interim finding of bony erosive change involving the medial aspect of the distal first proximal phalanx. No radiopaque foreign body is visualized. IMPRESSION: Interim development of bony erosive change involving the distal aspect of the left first proximal phalanx, concerning for osteomyelitis. These results will be called to the ordering clinician or representative by the Radiologist Assistant, and communication documented in the PACS or zVision Dashboard. Electronically Signed   By: Jasmine Pang M.D.   On: 09/24/2016 15:19   Results/Tests Pending at Time of Discharge: None  Discharge Medications:  Allergies as of 09/26/2016      Reactions   Other Anaphylaxis   Brazilian nuts      Medication List  TAKE these medications   cephALEXin 500 MG capsule Commonly known as:  KEFLEX Take 1 capsule (500 mg total) by mouth 4 (four) times daily.   ibuprofen 200  MG tablet Commonly known as:  ADVIL,MOTRIN Take 400 mg by mouth every 6 (six) hours as needed for moderate pain.   metFORMIN 500 MG tablet Commonly known as:  GLUCOPHAGE Take 1 tablet (500 mg total) by mouth 2 (two) times daily with a meal.       Discharge Instructions: Please refer to Patient Instructions section of EMR for full details.  Patient was counseled important signs and symptoms that should prompt return to medical care, changes in medications, dietary instructions, activity restrictions, and follow up appointments.   Follow-Up Appointments: Follow-up Information    Kathryne Hitchhristopher Y Blackman, MD. Schedule an appointment as soon as possible for a visit in 1 week(s).   Specialty:  Orthopedic Surgery Contact information: [address] [place] [address] [phone]           Lovena NeighboursAbdoulaye Allyah Heather, MD 09/26/2016, 10:52 PM PGY-1, [facility]

## 2016-09-27 LAB — AEROBIC CULTURE W GRAM STAIN (SUPERFICIAL SPECIMEN)

## 2016-09-27 LAB — AEROBIC CULTURE  (SUPERFICIAL SPECIMEN)

## 2016-09-27 NOTE — Op Note (Signed)
NAME:  Douglas Murphy, Douglas Murphy              ACCOUNT NO.:  [id number]  MEDICAL RECORD NO.:  [id number]  LOCATION:                                 FACILITY:  PHYSICIAN:  Vanita PandaChristopher Y. Magnus IvanBlackman, M.D.DATE OF BIRTH:  1966-04-27  DATE OF PROCEDURE:  09/25/2016 DATE OF DISCHARGE:  09/26/2016                              OPERATIVE REPORT   PREOPERATIVE DIAGNOSIS:  Osteomyelitis of left great toe proximal phalanx.  POSTOPERATIVE DIAGNOSIS:  Osteomyelitis of left great toe proximal phalanx.  PROCEDURE:  Left great toe amputation through MTP joint.  SURGEON:  Vanita PandaChristopher Y. Magnus IvanBlackman, M.D.  ANESTHESIA:  General.  ANTIBIOTICS:  He is on a regimen of IV antibiotics already.  FINDINGS:  Gross purulence and osteomyelitis of the proximal phalanx of left great toe.  COMPLICATIONS:  None.  BLOOD LOSS:  Minimal.  INDICATIONS:  [name] is a 51 year old gentleman who is diabetic.  He developed a wound on the medial aspect of his great toe in December.  He had some redness around this and was seen in the emergency room, and x- rays were negative for any osteomyelitis and the ER physicians put him on antibiotics.  He then presented yesterday to the emergency room with worsening toe swelling and pain as well as purulent drainage.  X-rays now confirmed osteomyelitis with evidence of __________ at the proximal phalanx.  Given these findings and his clinical exam, we have recommended great toe amputation.  He still had problems with controlling his diabetes.  Risks and benefits of surgery were explained to him in detail and he did wish to proceed.  PROCEDURE DESCRIPTION:  After informed consent was obtained, appropriate left foot was marked.  He was brought to the operating room and placed supine on the operating table.  General anesthesia was then obtained. Had his left foot prepped and draped with Betadine scrub and paint.  He had actually a good palpable pulse in his foot.  I then used a towel  and Esmarch around the ankle as a local tourniquet.  I was able then to sharply remove the great toe at the level of the MTP joint easily.  He had good brisk bleeding, which bodes well for healing.  I irrigated the wound with normal saline solution and let the Esmarch down from the ankle.  Hemostasis was obtained with electrocautery and then I reapproximated the soft tissue with interrupted nylon suture.  Xeroform and well-padded sterile dressings applied.  He was awakened, extubated, and taken to the recovery room in stable condition.  All final counts were correct.  There were no complications noted.     Vanita Pandahristopher Y. Magnus IvanBlackman, M.D.   ______________________________ Vanita Pandahristopher Y. Magnus IvanBlackman, M.D.    CYB/MEDQ  D:  09/25/2016  T:  09/26/2016  Job:  [id number]

## 2016-09-29 LAB — CULTURE, BLOOD (ROUTINE X 2)
CULTURE: NO GROWTH
Culture: NO GROWTH

## 2016-10-04 ENCOUNTER — Encounter (INDEPENDENT_AMBULATORY_CARE_PROVIDER_SITE_OTHER): Payer: Self-pay | Admitting: Physician Assistant

## 2016-10-04 ENCOUNTER — Encounter (INDEPENDENT_AMBULATORY_CARE_PROVIDER_SITE_OTHER): Payer: Self-pay

## 2016-10-04 ENCOUNTER — Ambulatory Visit (INDEPENDENT_AMBULATORY_CARE_PROVIDER_SITE_OTHER): Payer: Self-pay | Admitting: Physician Assistant

## 2016-10-04 DIAGNOSIS — Z89412 Acquired absence of left great toe: Secondary | ICD-10-CM

## 2016-10-04 DIAGNOSIS — IMO0002 Reserved for concepts with insufficient information to code with codable children: Secondary | ICD-10-CM

## 2016-10-04 NOTE — Progress Notes (Signed)
   Office Visit Note   Patient: Douglas Murphy           Date of Birth: 10/24/1965           MRN: [id number] Visit Date: 10/04/2016              Requested by: No referring provider defined for this encounter. PCP: No PCP Per Patient   Assessment & Plan: Visit Diagnoses: No diagnosis found.  Plan: We will leave his sutures in place and removed them at next office visit in approximately 10 days. He can wash the foot with the antibacterial soap dried completely and then apply Xeroform and a dry dressing. Continue postop shoe.Marland Kitchen. He understands that he will most likely be out of work for 6 weeks as he works in [employer], [employer].  Follow-Up Instructions: Return in about 10 days (around 10/14/2016) for wound check.   Orders:  No orders of the defined types were placed in this encounter.  No orders of the defined types were placed in this encounter.     Procedures: No procedures performed   Clinical Data: No additional findings.   Subjective: Chief Complaint  Patient presents with  . Left Foot - Routine Post Op  . LEFT GRT TOE    HPI Douglas Murphy post left great toe removal secondary to osteomyelitis 09/25/2016. Nice fevers chills overall doing well Review of Systems   Objective: Vital Signs: There were no vitals taken for this visit.  Physical Exam  Ortho Exam Surgical incision is well approximated with interrupted nylon sutures. No malodor no drainage. Slight maceration on the surgical incision site. Dorsal pedal pulses intact. Specialty Comments:  No specialty comments available.  Imaging: No results found.   PMFS History: Patient Active Problem List   Diagnosis Date Noted  . Osteomyelitis of left foot (HCC) 09/24/2016  . Diabetes mellitus (HCC)    No past medical history on file.  No family history on file.  Past Surgical History:  Procedure Laterality Date  . AMPUTATION TOE Left 09/25/2016   Procedure: AMPUTATION GREAT TOE;  Surgeon: Kathryne Hitchhristopher Y Blackman, MD;   [facility];  Service: Orthopedics;  Laterality: Left;  . KNEE ARTHROSCOPY     Social History   Occupational History  . Not on file.   Social History Main Topics  . Smoking status: Never Smoker  . Smokeless tobacco: Never Used  . Alcohol use No  . Drug use: No  . Sexual activity: Not on file

## 2016-10-14 ENCOUNTER — Ambulatory Visit (INDEPENDENT_AMBULATORY_CARE_PROVIDER_SITE_OTHER): Payer: Self-pay | Admitting: Physician Assistant

## 2016-10-14 ENCOUNTER — Encounter (INDEPENDENT_AMBULATORY_CARE_PROVIDER_SITE_OTHER): Payer: Self-pay | Admitting: Physician Assistant

## 2016-10-14 DIAGNOSIS — IMO0002 Reserved for concepts with insufficient information to code with codable children: Secondary | ICD-10-CM

## 2016-10-14 DIAGNOSIS — Z89422 Acquired absence of other left toe(s): Secondary | ICD-10-CM

## 2016-10-14 MED ORDER — MUPIROCIN 2 % EX OINT: 1.0000 "application " | TOPICAL_OINTMENT | Freq: Two times a day (BID) | CUTANEOUS | 0 refills | Status: DC

## 2016-10-14 NOTE — Progress Notes (Signed)
   Office Visit Note   Patient: Polo RileyWilliam Brian Consiglio           Date of Birth: 10/01/1965           MRN: [id number] Visit Date: 10/14/2016              Requested by: No referring provider defined for this encounter. PCP: No PCP Per Patient   Assessment & Plan: Visit Diagnoses:  1. Toe amputation status, left (HCC)     Plan: He will wash the left foot with an antibacterial soap dried completely and then apply a small amount of Bactroban and a dry clean dressing daily. Sutures were removed today. See him back in 10 days for wound check.   Follow-Up Instructions: Return in about 10 days (around 10/24/2016) for wound check.   Orders:  No orders of the defined types were placed in this encounter.  Meds ordered this encounter  Medications  . mupirocin ointment (BACTROBAN) 2 %    Sig: Place 1 application into the nose 2 (two) times daily.    Dispense:  22 g    Refill:  0      Procedures: No procedures performed   Clinical Data: No additional findings.   Subjective: Chief Complaint  Patient presents with  . Left Foot - Wound Check  . Follow-up    HPI Mr.Woody SellerSeward returns today 19 days status post left great toe amputation. Overall doing well still having some drainage. Had no fevers or chills Review of Systems   Objective: Vital Signs: There were no vitals taken for this visit.  Physical Exam  Ortho Exam Great toe slight erythema and no purulence no malodor. No expressible purulence. Sutures are intact. Specialty Comments:  No specialty comments available.  Imaging: No results found.   PMFS History: Patient Active Problem List   Diagnosis Date Noted  . Osteomyelitis of left foot (HCC) 09/24/2016  . Diabetes mellitus (HCC)    No past medical history on file.  No family history on file.  Past Surgical History:  Procedure Laterality Date  . AMPUTATION TOE Left 09/25/2016   Procedure: AMPUTATION GREAT TOE;  Surgeon: Kathryne Hitchhristopher Y Blackman, MD;  [facility];  Service: Orthopedics;  Laterality: Left;  . KNEE ARTHROSCOPY     Social History   Occupational History  . Not on file.   Social History Main Topics  . Smoking status: Never Smoker  . Smokeless tobacco: Never Used  . Alcohol use No  . Drug use: No  . Sexual activity: Not on file

## 2016-10-15 ENCOUNTER — Telehealth (INDEPENDENT_AMBULATORY_CARE_PROVIDER_SITE_OTHER): Payer: Self-pay | Admitting: *Deleted

## 2016-10-15 NOTE — Telephone Encounter (Signed)
please advise.

## 2016-10-15 NOTE — Telephone Encounter (Signed)
Yes it was bactroban it is to go on his toe once daily.

## 2016-10-15 NOTE — Telephone Encounter (Signed)
Pt called about RX that was called In yesterday, he stated that it was some sort of nasal ointment and that he wasn't sure if that's what he needed for his foot.

## 2016-10-15 NOTE — Telephone Encounter (Signed)
Patient aware this has been corrected at his pharmacy

## 2016-10-19 ENCOUNTER — Telehealth (INDEPENDENT_AMBULATORY_CARE_PROVIDER_SITE_OTHER): Payer: Self-pay

## 2016-10-19 NOTE — Telephone Encounter (Signed)
Talked with pharmacy to verify location for ointment to be used for his Left Great Toe.

## 2016-10-21 ENCOUNTER — Encounter (INDEPENDENT_AMBULATORY_CARE_PROVIDER_SITE_OTHER): Payer: Self-pay | Admitting: Physician Assistant

## 2016-10-21 ENCOUNTER — Ambulatory Visit (INDEPENDENT_AMBULATORY_CARE_PROVIDER_SITE_OTHER): Payer: Self-pay | Admitting: Physician Assistant

## 2016-10-21 DIAGNOSIS — M869 Osteomyelitis, unspecified: Secondary | ICD-10-CM

## 2016-10-21 NOTE — Progress Notes (Signed)
   Office Visit Note   Patient: Polo RileyWilliam Brian Reinertsen           Date of Birth: 03/23/1966           MRN: [id number] Visit Date: 10/21/2016              Requested by: No referring provider defined for this encounter. PCP: No PCP Per Patient   Assessment & Plan: Visit Diagnoses: No diagnosis found.  Plan: Continue to wash the foot daily with an antibacterial soap 510 blade her Bactroban after drying the foot completely. Continue postop shoe. 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 He is not able to ambulate for any long distances or standing for any long period time. We'll see him back in  3 weeks to check his progress lack.  Follow-Up Instructions: Return in about 3 weeks (around 11/11/2016) for wound check.   Orders:  No orders of the defined types were placed in this encounter.  No orders of the defined types were placed in this encounter.     Procedures: No procedures performed   Clinical Data: No additional findings.   Subjective: Chief Complaint  Patient presents with  . Left Foot - Wound Check  . Follow-up    HPI Mr. Keshishyan's today follow-up of his left foot wound where he had a amputation of his left great toe on 09/25/2017. Having some swelling. States not having really little pain having very minimal drainage. Bactroban to the incision wound site after washing the foot with a antibacterial soap and thoroughly drying. Review of Systems   Objective: Vital Signs: There were no vitals taken for this visit.  Physical Exam  Ortho Exam Foot great toe amputation site is healing well no malodor. No signs of infection. Minimal edema. Specialty Comments:  No specialty comments available.  Imaging: No results found.   PMFS History: Patient Active Problem List   Diagnosis Date Noted  . Osteomyelitis of left foot (HCC) 09/24/2016  . Diabetes mellitus (HCC)    No past medical history on file.  No family history on file.  Past  Surgical History:  Procedure Laterality Date  . AMPUTATION TOE Left 09/25/2016   Procedure: AMPUTATION GREAT TOE;  Surgeon: Kathryne Hitchhristopher Y Blackman, MD;  [facility];  Service: Orthopedics;  Laterality: Left;  . KNEE ARTHROSCOPY     Social History   Occupational History  . Not on file.   Social History Main Topics  . Smoking status: Never Smoker  . Smokeless tobacco: Never Used  . Alcohol use No  . Drug use: No  . Sexual activity: Not on file

## 2016-10-25 ENCOUNTER — Ambulatory Visit (INDEPENDENT_AMBULATORY_CARE_PROVIDER_SITE_OTHER): Payer: Self-pay | Admitting: Physician Assistant

## 2016-11-11 ENCOUNTER — Encounter (INDEPENDENT_AMBULATORY_CARE_PROVIDER_SITE_OTHER): Payer: Self-pay | Admitting: Physician Assistant

## 2016-11-11 ENCOUNTER — Ambulatory Visit (INDEPENDENT_AMBULATORY_CARE_PROVIDER_SITE_OTHER): Payer: Self-pay | Admitting: Physician Assistant

## 2016-11-11 DIAGNOSIS — Z89412 Acquired absence of left great toe: Secondary | ICD-10-CM

## 2016-11-11 NOTE — Progress Notes (Signed)
   Office Visit Note   Patient: Douglas RileyWilliam Brian Buxton           Date of Birth: 01/14/1966           MRN: [id number] Visit Date: 11/11/2016              Requested by: No referring provider defined for this encounter. PCP: No PCP Per Patient   Assessment & Plan: Visit Diagnoses:  1. Status post amputation of left great toe (HCC)     Plan: Any current care with Bactroban to the toe washed and the toe amputation site daily. Be careful about shoewear no narrow toe box. Spoke with him about a gastrocsoleus stretching exercises and shoewear at length today.  Follow-Up Instructions: Return in about 4 weeks (around 12/09/2016) for post op.   Orders:  No orders of the defined types were placed in this encounter.  No orders of the defined types were placed in this encounter.     Procedures: No procedures performed   Clinical Data: No additional findings.   Subjective: Chief Complaint  Patient presents with  . Left Foot - Routine Post Op    Post Left Great toe amp 09/25/2016    [name] is here today for  follow-up of his left foot wound where he had a amputation of his left great toe on 09/25/2017. There is a small wound on that the end that does have minimal drainage, no odor. He's had no fevers chills shortness breath. Feels overall he is trending towards.    Review of Systems   Objective: Vital Signs: There were no vitals taken for this visit.  Physical Exam  Ortho Exam Left foot great toe location site is granulating and giddiness is much smaller than it was at last visit. There is no signs of infection. Dorsal pedal pulses present. There is no impending ulcers of the foot. Tight gastrocs bilaterally  Specialty Comments:  No specialty comments available.  Imaging: No results found.   PMFS History: Patient Active Problem List   Diagnosis Date Noted  . Osteomyelitis of left foot (HCC) 09/24/2016  . Diabetes mellitus (HCC)    No past medical history on file.    No family history on file.  Past Surgical History:  Procedure Laterality Date  . AMPUTATION TOE Left 09/25/2016   Procedure: AMPUTATION GREAT TOE;  Surgeon: Kathryne Hitchhristopher Y Blackman, MD;  [facility];  Service: Orthopedics;  Laterality: Left;  . KNEE ARTHROSCOPY     Social History   Occupational History  . Not on file.   Social History Main Topics  . Smoking status: Never Smoker  . Smokeless tobacco: Never Used  . Alcohol use No  . Drug use: No  . Sexual activity: Not on file

## 2016-11-22 ENCOUNTER — Telehealth (INDEPENDENT_AMBULATORY_CARE_PROVIDER_SITE_OTHER): Payer: Self-pay | Admitting: Orthopaedic Surgery

## 2016-11-22 NOTE — Telephone Encounter (Signed)
Patient called asked if Dr Magnus IvanBlackman could write a note allowing him to return back to work. Patient asked if the note could be faxed to his employer.  Attn: Lina SayreJohnny Marshall. The fax # is [phone]

## 2016-11-23 ENCOUNTER — Encounter (INDEPENDENT_AMBULATORY_CARE_PROVIDER_SITE_OTHER): Payer: Self-pay

## 2016-11-23 NOTE — Telephone Encounter (Signed)
Written and patient picked up

## 2016-12-14 ENCOUNTER — Ambulatory Visit (INDEPENDENT_AMBULATORY_CARE_PROVIDER_SITE_OTHER): Payer: Self-pay | Admitting: Physician Assistant

## 2016-12-20 ENCOUNTER — Ambulatory Visit (INDEPENDENT_AMBULATORY_CARE_PROVIDER_SITE_OTHER): Payer: Self-pay | Admitting: Physician Assistant

## 2016-12-20 ENCOUNTER — Encounter (INDEPENDENT_AMBULATORY_CARE_PROVIDER_SITE_OTHER): Payer: Self-pay | Admitting: Physician Assistant

## 2016-12-20 DIAGNOSIS — M869 Osteomyelitis, unspecified: Secondary | ICD-10-CM

## 2016-12-20 NOTE — Progress Notes (Signed)
Douglas Murphy is here today for follow-up of his left great toe amputation which was performed on 09/25/2016. Overall doing well some drainage at the end of the day on his sock. He states that is had no fevers chills. He has been applying  Bactroban to the wound and Band-Aid daily.   Physical exam: Left great toe amputation site is almost completely healed. There is an area of the laceration with slight flexion ablation. Unable to express any purulence from this area. A 15 blade use used to try to suppress any purulence and none was encountered. The foot is nontender. There is no signs of infection otherwise.  Plan: He'll follow with [place] on an as-needed basis.. Discussed with him defined Downing calluses that form. He can stop Bactroban at this point time. Questions encouraged and answered.

## 2016-12-29 ENCOUNTER — Ambulatory Visit (INDEPENDENT_AMBULATORY_CARE_PROVIDER_SITE_OTHER): Payer: Self-pay | Admitting: Orthopaedic Surgery

## 2016-12-29 DIAGNOSIS — M869 Osteomyelitis, unspecified: Secondary | ICD-10-CM

## 2016-12-29 NOTE — Progress Notes (Signed)
Douglas Murphy is a 51 year old diabetic and we performed a left great toe amputation on January 13. He saw his primary care physician this week due to some drainage at the tip of his amputation area and placed on doxycycline. He's been soaking his foot in Epsom salt as well.  On examination of his left foot there is a small opening at the tip. According the patient x-rays do not show any bony destruction. There is a slight serous drainage from this today but it is concerning.  I spoke with him about the need for likely irrigation and debridement of his left foot and maybe backing the metatarsal of the little further. We'll return to do at this point though is continue the antibiotics and keep him off his foot not a work for the next 7-10 days. I will see him back on week from today. He is in a place Bactroban ointment on the small opening daily. When he comes back to see me in a week I would like an AP single view of his left foot. We will likely end up needing to set him up for outpatient surgery at that point.

## 2017-01-03 ENCOUNTER — Ambulatory Visit (INDEPENDENT_AMBULATORY_CARE_PROVIDER_SITE_OTHER): Payer: Self-pay | Admitting: Physician Assistant

## 2017-01-04 ENCOUNTER — Encounter (INDEPENDENT_AMBULATORY_CARE_PROVIDER_SITE_OTHER): Payer: Self-pay | Admitting: Orthopaedic Surgery

## 2017-01-04 ENCOUNTER — Other Ambulatory Visit (INDEPENDENT_AMBULATORY_CARE_PROVIDER_SITE_OTHER): Payer: Self-pay | Admitting: Orthopaedic Surgery

## 2017-01-04 ENCOUNTER — Ambulatory Visit (INDEPENDENT_AMBULATORY_CARE_PROVIDER_SITE_OTHER): Payer: Self-pay

## 2017-01-04 ENCOUNTER — Ambulatory Visit (INDEPENDENT_AMBULATORY_CARE_PROVIDER_SITE_OTHER): Payer: Self-pay | Admitting: Orthopaedic Surgery

## 2017-01-04 DIAGNOSIS — M869 Osteomyelitis, unspecified: Secondary | ICD-10-CM

## 2017-01-04 MED ORDER — DOXYCYCLINE HYCLATE 100 MG PO TABS: 100.0000 mg | ORAL_TABLET | Freq: Two times a day (BID) | ORAL | 0 refills | Status: DC

## 2017-01-04 NOTE — Progress Notes (Signed)
Douglas Murphy is returning in follow-up for his left foot. He had a great toe and dictation in January. We saw him last week with a small open wound and drainage and is been on doxycycline and soaking his foot. At one is persisted. His blood glucose is still running in the 200s area  On exam there is some slight drainage from a small wound at the tip of his amputation site which is some slight redness and swelling. I cannot express any purulent discharge but there was clear drainage.  X-rays of the foot are obtained and I see no evidence of osteomyelitis in the metatarsal but certainly that is concerning.  Due to the continued drainage and wound recommending an irrigation and debridement of his first ray including removal just a few millimeters of the first metatarsal that is remaining. This can be done as an outpatient. With thorough discussion about the risk and benefits of surgery and the reasoning behind proceeding with surgery. All questions were encouraged and answered. We'll continue his doxycycline in the interim. We would then see him back at 2 weeks postoperative for continued assessment.

## 2017-01-06 ENCOUNTER — Other Ambulatory Visit (INDEPENDENT_AMBULATORY_CARE_PROVIDER_SITE_OTHER): Payer: Self-pay | Admitting: Physician Assistant

## 2017-01-07 ENCOUNTER — Encounter (HOSPITAL_COMMUNITY): Payer: Self-pay | Admitting: *Deleted

## 2017-01-07 NOTE — Progress Notes (Signed)
Mr Corona reports that he now has CBG's down to high 100's.  I encouraged patient to check CBG more frequently until surgery to try to see if certain foods are causing it to stay in high 100's.  (Metformin has been increased.)  I instructed patient to not take Metformin the morning of surgery.  I instructed patient to check CBG to check CBG and if it is less than 70 to treat it with  1/2 cup of clear juice like apple juice or cranberry juice. I instructed patient to recheck CBG in 15 minutes and if CBG is not greater than 70, to  Call [phone] (pre- op). If it is before pre-op opens to retreat as before and recheck CBG in 15 minutes. I told patient to make note of time that liquid is taken and amount, that surgical time may have to be adjusted.   I instructed patient to not take any more Alka Selters, Ibuprofen, Aspirin or Aleve until instructed that it is ok after surgery.

## 2017-01-11 ENCOUNTER — Encounter (HOSPITAL_COMMUNITY): Payer: Self-pay | Admitting: Anesthesiology

## 2017-01-11 ENCOUNTER — Ambulatory Visit (HOSPITAL_COMMUNITY)
Admission: RE | Admit: 2017-01-11 | Discharge: 2017-01-11 | Disposition: A | Discharge status: Home / Self Care | Payer: Self-pay | Source: Ambulatory Visit | Attending: Orthopaedic Surgery | Admitting: Orthopaedic Surgery

## 2017-01-11 ENCOUNTER — Encounter (HOSPITAL_COMMUNITY)
Admission: RE | Disposition: A | Discharge status: Home / Self Care | Payer: Self-pay | Source: Ambulatory Visit | Attending: Orthopaedic Surgery

## 2017-01-11 ENCOUNTER — Ambulatory Visit (HOSPITAL_COMMUNITY): Payer: Self-pay | Admitting: Anesthesiology

## 2017-01-11 DIAGNOSIS — Z7984 Long term (current) use of oral hypoglycemic drugs: Secondary | ICD-10-CM | POA: Insufficient documentation

## 2017-01-11 DIAGNOSIS — Y835 Amputation of limb(s) as the cause of abnormal reaction of the patient, or of later complication, without mention of misadventure at the time of the procedure: Secondary | ICD-10-CM | POA: Insufficient documentation

## 2017-01-11 DIAGNOSIS — Z7982 Long term (current) use of aspirin: Secondary | ICD-10-CM | POA: Insufficient documentation

## 2017-01-11 DIAGNOSIS — T8789 Other complications of amputation stump: Secondary | ICD-10-CM | POA: Insufficient documentation

## 2017-01-11 DIAGNOSIS — M869 Osteomyelitis, unspecified: Secondary | ICD-10-CM | POA: Diagnosis present

## 2017-01-11 DIAGNOSIS — E119 Type 2 diabetes mellitus without complications: Secondary | ICD-10-CM | POA: Insufficient documentation

## 2017-01-11 HISTORY — PX: I & D EXTREMITY: SHX5045

## 2017-01-11 HISTORY — DX: Type 2 diabetes mellitus without complications: E11.9

## 2017-01-11 LAB — BASIC METABOLIC PANEL
ANION GAP: 10 (ref 5–15)
BUN: 17 mg/dL (ref 6–20)
CALCIUM: 8.7 mg/dL — AB (ref 8.9–10.3)
CO2: 22 mmol/L (ref 22–32)
Chloride: 105 mmol/L (ref 101–111)
Creatinine, Ser: 0.75 mg/dL (ref 0.61–1.24)
GFR calc Af Amer: >60 mL/min (ref >60)
GFR calc non Af Amer: >60 mL/min (ref >60)
GLUCOSE: 180 mg/dL — AB (ref 65–99)
Potassium: 4.2 mmol/L (ref 3.5–5.1)
Sodium: 137 mmol/L (ref 135–145)

## 2017-01-11 LAB — GLUCOSE, CAPILLARY
GLUCOSE-CAPILLARY: 174 mg/dL — AB (ref 65–99)
Glucose-Capillary: 136 mg/dL — ABNORMAL HIGH (ref 65–99)
Glucose-Capillary: 134 mg/dL — ABNORMAL HIGH (ref 65–99)

## 2017-01-11 LAB — CBC
HEMATOCRIT: 41.2 % (ref 39.0–52.0)
Hemoglobin: 13.8 g/dL (ref 13.0–17.0)
MCH: 29 pg (ref 26.0–34.0)
MCHC: 33.5 g/dL (ref 30.0–36.0)
MCV: 86.6 fL (ref 78.0–100.0)
Platelets: 326 10*3/uL (ref 150–400)
RBC: 4.76 MIL/uL (ref 4.22–5.81)
RDW: 14 % (ref 11.5–15.5)
WBC: 9.9 10*3/uL (ref 4.0–10.5)

## 2017-01-11 SURGERY — IRRIGATION AND DEBRIDEMENT EXTREMITY
Anesthesia: General | Site: Foot | Laterality: Left

## 2017-01-11 MED ORDER — MIDAZOLAM HCL 2 MG/2ML IJ SOLN
INTRAMUSCULAR | Status: DC | PRN
  Administered 2017-01-11: 2 mg via INTRAVENOUS

## 2017-01-11 MED ORDER — MEPERIDINE HCL 25 MG/ML IJ SOLN: 6.2500 mg | INTRAMUSCULAR | Status: DC | PRN

## 2017-01-11 MED ORDER — CHLORHEXIDINE GLUCONATE 4 % EX LIQD: 60.0000 mL | Freq: Once | CUTANEOUS | Status: DC

## 2017-01-11 MED ORDER — HYDROMORPHONE HCL 1 MG/ML IJ SOLN: 0.2500 mg | INTRAMUSCULAR | Status: DC | PRN

## 2017-01-11 MED ORDER — PROPOFOL 10 MG/ML IV BOLUS
INTRAVENOUS | Status: AC
  Filled 2017-01-11: qty 20

## 2017-01-11 MED ORDER — LIDOCAINE 2% (20 MG/ML) 5 ML SYRINGE
INTRAMUSCULAR | Status: AC
  Filled 2017-01-11: qty 5

## 2017-01-11 MED ORDER — LIDOCAINE HCL (CARDIAC) 20 MG/ML IV SOLN
INTRAVENOUS | Status: DC | PRN
  Administered 2017-01-11: 100 mg via INTRATRACHEAL

## 2017-01-11 MED ORDER — KETOROLAC TROMETHAMINE 30 MG/ML IJ SOLN: 30.0000 mg | Freq: Once | INTRAMUSCULAR | Status: DC | PRN

## 2017-01-11 MED ORDER — FENTANYL CITRATE (PF) 250 MCG/5ML IJ SOLN
INTRAMUSCULAR | Status: AC
  Filled 2017-01-11: qty 5

## 2017-01-11 MED ORDER — PROPOFOL 10 MG/ML IV BOLUS
INTRAVENOUS | Status: DC | PRN
  Administered 2017-01-11: 200 mg via INTRAVENOUS

## 2017-01-11 MED ORDER — HYDROCODONE-ACETAMINOPHEN 5-325 MG PO TABS: 1.0000 | ORAL_TABLET | ORAL | 0 refills | Status: DC | PRN

## 2017-01-11 MED ORDER — ONDANSETRON HCL 4 MG/2ML IJ SOLN
INTRAMUSCULAR | Status: DC | PRN
  Administered 2017-01-11: 4 mg via INTRAVENOUS

## 2017-01-11 MED ORDER — FENTANYL CITRATE (PF) 250 MCG/5ML IJ SOLN
INTRAMUSCULAR | Status: DC | PRN
  Administered 2017-01-11: 100 ug via INTRAVENOUS

## 2017-01-11 MED ORDER — CEFAZOLIN SODIUM-DEXTROSE 2-4 GM/100ML-% IV SOLN
INTRAVENOUS | Status: AC
  Filled 2017-01-11: qty 100

## 2017-01-11 MED ORDER — CEFAZOLIN SODIUM-DEXTROSE 2-4 GM/100ML-% IV SOLN
2.0000 g | INTRAVENOUS | Status: AC
  Administered 2017-01-11: 2 g via INTRAVENOUS

## 2017-01-11 MED ORDER — ONDANSETRON HCL 4 MG/2ML IJ SOLN
INTRAMUSCULAR | Status: AC
  Filled 2017-01-11: qty 2

## 2017-01-11 MED ORDER — PHENYLEPHRINE 40 MCG/ML (10ML) SYRINGE FOR IV PUSH (FOR BLOOD PRESSURE SUPPORT)
PREFILLED_SYRINGE | INTRAVENOUS | Status: DC | PRN
  Administered 2017-01-11 (×3): 80 ug via INTRAVENOUS

## 2017-01-11 MED ORDER — SODIUM CHLORIDE 0.9 % IR SOLN
Status: DC | PRN
  Administered 2017-01-11: 1000 mL

## 2017-01-11 MED ORDER — MIDAZOLAM HCL 2 MG/2ML IJ SOLN
INTRAMUSCULAR | Status: AC
  Filled 2017-01-11: qty 2

## 2017-01-11 MED ORDER — LACTATED RINGERS IV SOLN
INTRAVENOUS | Status: DC
  Administered 2017-01-11 (×2): via INTRAVENOUS

## 2017-01-11 MED ORDER — PROMETHAZINE HCL 25 MG/ML IJ SOLN: 6.2500 mg | INTRAMUSCULAR | Status: DC | PRN

## 2017-01-11 SURGICAL SUPPLY — 51 items
BANDAGE ACE 3X5.8 VEL STRL LF (GAUZE/BANDAGES/DRESSINGS) IMPLANT
BANDAGE ESMARK 6X9 LF (GAUZE/BANDAGES/DRESSINGS) ×1 IMPLANT
BLADE LONG MED 31X9 (MISCELLANEOUS) ×2 IMPLANT
BLADE SURG 10 STRL SS (BLADE) ×2 IMPLANT
BNDG COHESIVE 4X5 TAN STRL (GAUZE/BANDAGES/DRESSINGS) ×2 IMPLANT
BNDG COHESIVE 6X5 TAN STRL LF (GAUZE/BANDAGES/DRESSINGS) IMPLANT
BNDG ESMARK 6X9 LF (GAUZE/BANDAGES/DRESSINGS) ×2
BNDG GAUZE ELAST 4 BULKY (GAUZE/BANDAGES/DRESSINGS) ×2 IMPLANT
COVER SURGICAL LIGHT HANDLE (MISCELLANEOUS) ×2 IMPLANT
CUFF TOURNIQUET SINGLE 18IN (TOURNIQUET CUFF) IMPLANT
CUFF TOURNIQUET SINGLE 34IN LL (TOURNIQUET CUFF) IMPLANT
DRAPE ORTHO SPLIT 77X108 STRL (DRAPES)
DRAPE SURG 17X23 STRL (DRAPES) IMPLANT
DRAPE SURG ORHT 6 SPLT 77X108 (DRAPES) IMPLANT
DRAPE U-SHAPE 47X51 STRL (DRAPES) ×2 IMPLANT
DURAPREP 26ML APPLICATOR (WOUND CARE) ×2 IMPLANT
ELECT REM PT RETURN 9FT ADLT (ELECTROSURGICAL)
ELECTRODE REM PT RTRN 9FT ADLT (ELECTROSURGICAL) IMPLANT
GAUZE SPONGE 4X4 12PLY STRL (GAUZE/BANDAGES/DRESSINGS) ×2 IMPLANT
GAUZE SPONGE 4X4 12PLY STRL LF (GAUZE/BANDAGES/DRESSINGS) ×2 IMPLANT
GAUZE XEROFORM 1X8 LF (GAUZE/BANDAGES/DRESSINGS) IMPLANT
GAUZE XEROFORM 5X9 LF (GAUZE/BANDAGES/DRESSINGS) ×2 IMPLANT
GLOVE BIO SURGEON STRL SZ8 (GLOVE) ×2 IMPLANT
GLOVE BIOGEL PI IND STRL 8 (GLOVE) ×2 IMPLANT
GLOVE BIOGEL PI INDICATOR 8 (GLOVE) ×2
GLOVE ORTHO TXT STRL SZ7.5 (GLOVE) ×2 IMPLANT
GOWN STRL REUS W/ TWL LRG LVL3 (GOWN DISPOSABLE) ×1 IMPLANT
GOWN STRL REUS W/ TWL XL LVL3 (GOWN DISPOSABLE) ×2 IMPLANT
GOWN STRL REUS W/TWL LRG LVL3 (GOWN DISPOSABLE) ×1
GOWN STRL REUS W/TWL XL LVL3 (GOWN DISPOSABLE) ×2
HANDPIECE INTERPULSE COAX TIP (DISPOSABLE)
KIT BASIN OR (CUSTOM PROCEDURE TRAY) ×2 IMPLANT
KIT ROOM TURNOVER OR (KITS) ×2 IMPLANT
MANIFOLD NEPTUNE II (INSTRUMENTS) ×2 IMPLANT
NS IRRIG 1000ML POUR BTL (IV SOLUTION) ×2 IMPLANT
PACK ORTHO EXTREMITY (CUSTOM PROCEDURE TRAY) ×2 IMPLANT
PAD ARMBOARD 7.5X6 YLW CONV (MISCELLANEOUS) ×4 IMPLANT
PADDING CAST ABS 4INX4YD NS (CAST SUPPLIES)
PADDING CAST ABS COTTON 4X4 ST (CAST SUPPLIES) IMPLANT
PADDING CAST COTTON 6X4 STRL (CAST SUPPLIES) IMPLANT
SET HNDPC FAN SPRY TIP SCT (DISPOSABLE) IMPLANT
SPONGE LAP 18X18 X RAY DECT (DISPOSABLE) IMPLANT
STOCKINETTE IMPERVIOUS 9X36 MD (GAUZE/BANDAGES/DRESSINGS) ×2 IMPLANT
SUT ETHILON 2 0 FS 18 (SUTURE) IMPLANT
SUT ETHILON 3 0 PS 1 (SUTURE) IMPLANT
SWAB CULTURE ESWAB REG 1ML (MISCELLANEOUS) IMPLANT
TOWEL OR 17X24 6PK STRL BLUE (TOWEL DISPOSABLE) ×2 IMPLANT
TOWEL OR 17X26 10 PK STRL BLUE (TOWEL DISPOSABLE) ×2 IMPLANT
TUBE CONNECTING 12X1/4 (SUCTIONS) ×2 IMPLANT
UNDERPAD 30X30 (UNDERPADS AND DIAPERS) ×2 IMPLANT
YANKAUER SUCT BULB TIP NO VENT (SUCTIONS) ×2 IMPLANT

## 2017-01-11 NOTE — Anesthesia Procedure Notes (Signed)
Procedure Name: LMA Insertion Date/Time: 01/11/2017 3:41 PM Performed by: Burt Ek Pre-anesthesia Checklist: Patient identified, Emergency Drugs available, Suction available and Patient being monitored Patient Re-evaluated:Patient Re-evaluated prior to inductionOxygen Delivery Method: Circle system utilized Preoxygenation: Pre-oxygenation with 100% oxygen Intubation Type: IV induction Ventilation: Mask ventilation without difficulty LMA: LMA flexible inserted LMA Size: 5.0 Number of attempts: 1 Airway Equipment and Method: Patient positioned with wedge pillow Placement Confirmation: positive ETCO2 and breath sounds checked- equal and bilateral Tube secured with: Tape Dental Injury: Teeth and Oropharynx as per pre-operative assessment

## 2017-01-11 NOTE — Transfer of Care (Signed)
Immediate Anesthesia Transfer of Care Note  Patient: Douglas Murphy  Procedure(s) Performed: Procedure(s): IRRIGATION AND DEBRIDEMENT LEFT FOOT FIRST RAY WOUND (Left)  Patient Location: PACU  Anesthesia Type:General  Level of Consciousness: drowsy and patient cooperative  Airway & Oxygen Therapy: Patient Spontanous Breathing and Patient connected to nasal cannula oxygen  Post-op Assessment: Report given to RN, Post -op Vital signs reviewed and stable and Patient moving all extremities X 4  Post vital signs: Reviewed and stable  Last Vitals:  Vitals:   01/11/17 1224 01/11/17 1614  BP: (!) 127/91 (!) 132/95  Pulse: 90 82  Resp: 20 17  Temp: 36.4 C 36.3 C    Last Pain:  Vitals:   01/11/17 1614  TempSrc:   PainSc: Asleep      Patients Stated Pain Goal: 3 (01/11/17 1221)  Complications: No apparent anesthesia complications

## 2017-01-11 NOTE — Progress Notes (Signed)
Orthopedic Tech Progress Note Patient Details:  Douglas Murphy [birth year] [id number]  Ortho Devices Type of Ortho Device: Postop shoe/boot Ortho Device/Splint Location: LLE Ortho Device/Splint Interventions: Ordered, Application   [name] 01/11/2017, 4:53 PM

## 2017-01-11 NOTE — Anesthesia Postprocedure Evaluation (Signed)
Anesthesia Post Note  Patient: Douglas Murphy  Procedure(s) Performed: Procedure(s) (LRB): IRRIGATION AND DEBRIDEMENT LEFT FOOT FIRST RAY WOUND (Left)  Patient location during evaluation: PACU Anesthesia Type: General Level of consciousness: awake and alert Pain management: pain level controlled Vital Signs Assessment: post-procedure vital signs reviewed and stable Respiratory status: spontaneous breathing, nonlabored ventilation, respiratory function stable and patient connected to nasal cannula oxygen Cardiovascular status: blood pressure returned to baseline and stable Postop Assessment: no signs of nausea or vomiting Anesthetic complications: no       Last Vitals:  Vitals:   01/11/17 1614 01/11/17 1630  BP: (!) 132/95 (!) 133/91  Pulse: 82 82  Resp: 17 20  Temp: 36.3 C     Last Pain:  Vitals:   01/11/17 1630  TempSrc:   PainSc: Asleep                 Ramonita Koenig DAVID

## 2017-01-11 NOTE — Brief Op Note (Signed)
01/11/2017  4:01 PM  PATIENT:  Douglas Murphy  51 y.o. male  PRE-OPERATIVE DIAGNOSIS:  left foot chronic wound with possible osteomyelitis  POST-OPERATIVE DIAGNOSIS:  left foot chronic wound with possible osteomyelitis  PROCEDURE:  Procedure(s): IRRIGATION AND DEBRIDEMENT LEFT FOOT FIRST RAY WOUND (Left)  SURGEON:  Surgeon(s) and Role:    * Kathryne Hitch, MD - Primary  PHYSICIAN ASSISTANT: Rexene Edison, PA-C  ANESTHESIA:   general  EBL:  Total I/O In: 1000 [I.V.:1000] Out: 5 [Blood:5]  COUNTS:  YES  DICTATION: .Other Dictation: Dictation Number [id number]  PLAN OF CARE: Discharge to home after PACU  PATIENT DISPOSITION:  PACU - hemodynamically stable.   Delay start of Pharmacological VTE agent (>24hrs) due to surgical blood loss or risk of bleeding: no

## 2017-01-11 NOTE — Anesthesia Preprocedure Evaluation (Addendum)
Anesthesia Evaluation  Patient identified by MRN, date of birth, ID band Patient awake    Reviewed: Allergy & Precautions, NPO status , Patient's Chart, lab work & pertinent test results  Airway Mallampati: I  TM Distance: >3 FB Neck ROM: Full    Dental  (+) Teeth Intact, Dental Advisory Given   Pulmonary neg pulmonary ROS,    Pulmonary exam normal breath sounds clear to auscultation       Cardiovascular negative cardio ROS Normal cardiovascular exam Rhythm:Regular Rate:Normal     Neuro/Psych negative neurological ROS  negative psych ROS   GI/Hepatic negative GI ROS, Neg liver ROS,   Endo/Other  diabetes, Type 2, Oral Hypoglycemic Agents  Renal/GU negative Renal ROS  negative genitourinary   Musculoskeletal negative musculoskeletal ROS (+)   Abdominal Normal abdominal exam  (+)   Peds negative pediatric ROS (+)  Hematology negative hematology ROS (+)   Anesthesia Other Findings   Reproductive/Obstetrics                             Anesthesia Physical  Anesthesia Plan  ASA: III  Anesthesia Plan: General   Post-op Pain Management:    Induction: Intravenous  Airway Management Planned: LMA  Additional Equipment:   Intra-op Plan:   Post-operative Plan:   Informed Consent: I have reviewed the patients History and Physical, chart, labs and discussed the procedure including the risks, benefits and alternatives for the proposed anesthesia with the patient or authorized representative who has indicated his/her understanding and acceptance.   Dental advisory given  Plan Discussed with: CRNA, Surgeon and Anesthesiologist  Anesthesia Plan Comments:        Anesthesia Quick Evaluation

## 2017-01-11 NOTE — Discharge Instructions (Signed)
Keep your left foot dressing clean and dry. You can put weight on your left heel. You can start driving tomorrow.

## 2017-01-11 NOTE — H&P (Signed)
Douglas Murphy is an 51 y.o. male.   Chief Complaint:   Left foot wound with drainage HPI:   51 yo diabetic male who earlier this year underwent a left great toe amputation to the level of the MTP joint.  He recently started to develop drainage from the end of the amputation site and has a small open wound there.  Surgery has been recommended to address this issue.  He has done better recently he states with his glucose control.  He understands fully the recommendation for surgery.  Past Medical History:  Diagnosis Date  . Diabetes mellitus without complication (HCC)    Type II    Past Surgical History:  Procedure Laterality Date  . AMPUTATION TOE Left 09/25/2016   Procedure: AMPUTATION GREAT TOE;  Surgeon: Kathryne Hitch, MD;  Location[facility];  Service: Orthopedics;  Laterality: Left;  . KNEE ARTHROSCOPY Right    x2  . TONSILLECTOMY    . TYMPANOSTOMY TUBE PLACEMENT      History reviewed. No pertinent family history. Social History:  reports that he has never smoked. He has never used smokeless tobacco. He reports that he does not drink alcohol or use drugs.  Allergies:  Allergies  Allergen Reactions  . Other Anaphylaxis    [place] nuts     Medications Prior to Admission  Medication Sig Dispense Refill  . aspirin-sod bicarb-citric acid (ALKA-SELTZER) 325 MG TBEF tablet Take 650 mg by mouth daily as needed (indigestion).    . calcium carbonate (TUMS - DOSED IN MG ELEMENTAL CALCIUM) 500 MG chewable tablet Chew 3 tablets by mouth daily as needed for indigestion or heartburn.    . doxycycline (VIBRA-TABS) 100 MG tablet Take 1 tablet (100 mg total) by mouth 2 (two) times daily. 30 tablet 0  . ibuprofen (ADVIL,MOTRIN) 200 MG tablet Take 400 mg by mouth 2 (two) times daily as needed for moderate pain.     . metFORMIN (GLUCOPHAGE) 1000 MG tablet Take 1,000 mg by mouth 2 (two) times daily.    . mupirocin ointment (BACTROBAN) 2 % Place 1 application into the nose 2 (two)  times daily. (Patient taking differently: Apply 1 application topically daily. Left big toe) 22 g 0  . cephALEXin (KEFLEX) 500 MG capsule Take 1 capsule (500 mg total) by mouth 4 (four) times daily. (Patient not taking: Reported on 11/11/2016) 30 capsule 0  . metFORMIN (GLUCOPHAGE) 500 MG tablet Take 1 tablet (500 mg total) by mouth 2 (two) times daily with a meal. (Patient not taking: Reported on 01/06/2017) 60 tablet 1    No results found for this or any previous visit (from the past 48 hour(s)). No results found.  Review of Systems  All other systems reviewed and are negative.   Blood pressure (!) 127/91, pulse 90, temperature 97.6 F (36.4 C), temperature source Oral, resp. rate 20, height 6' (1.829 m), weight 195 lb (88.5 kg), SpO2 100 %. Physical Exam  Constitutional: He is oriented to person, place, and time. He appears well-developed and well-nourished.  HENT:  Head: Normocephalic and atraumatic.  Eyes: EOM are normal. Pupils are equal, round, and reactive to light.  Neck: Normal range of motion. Neck supple.  Cardiovascular: Normal rate and regular rhythm.   Respiratory: Effort normal and breath sounds normal.  GI: Soft. Bowel sounds are normal.  Musculoskeletal:       Feet:  Neurological: He is alert and oriented to person, place, and time.  Skin: Skin is warm and dry.  Psychiatric: He has a normal mood and affect.     Assessment/Plan Left foot wound post 1st ray amputation 1)  To the OR as an outpatient for an irrigation and debridement of the left foot first ray with shortening of the 1st metatarsal.  Risks and benefits have been discussed in detail  Kathryne Hitch, MD 01/11/2017, 12:37 PM

## 2017-01-12 ENCOUNTER — Encounter (HOSPITAL_COMMUNITY): Payer: Self-pay | Admitting: Orthopaedic Surgery

## 2017-01-12 LAB — HEMOGLOBIN A1C
Hgb A1c MFr Bld: 10.2 % — ABNORMAL HIGH (ref 4.8–5.6)
MEAN PLASMA GLUCOSE: 246 mg/dL

## 2017-01-12 NOTE — Op Note (Signed)
NAME:  Douglas Murphy, Douglas  Murphy, Douglas Murphy  MEDICAL RECORD NO.:  [id number]  LOCATION:                                FACILITY:  [facility]  PHYSICIAN:  Vanita Panda. Magnus Ivan, M.D.DATE OF BIRTH:  Oct 06, 1965  DATE OF PROCEDURE:  01/11/2017 DATE OF DISCHARGE:  01/11/2017                              OPERATIVE REPORT   PREOPERATIVE DIAGNOSIS:  Status post left foot first ray amputation with chronic wound, left foot first ray.  POSTOPERATIVE DIAGNOSIS:  Status post left foot first ray amputation with chronic wound, left foot first ray.  PROCEDURE:  Irrigation and debridement of left foot first ray including sharp excisional debridement of skin, soft tissue and bone with shortening of the first metatarsal.  FINDINGS:  No gross purulence or evidence of osteomyelitis.  SURGEON:  Vanita Panda. Magnus Ivan, M.D.  ASSISTANT:  Richardean Canal, PA-C.  ANESTHESIA:  General.  BLOOD LOSS:  Minimal.  COMPLICATIONS:  None.  INDICATIONS:  Mr. Zehner is a 51 year old diabetic, who is status post a left foot great toe amputation to the level of the MTP joint earlier this year.  He has done well, but then developed a small wound at the end of his incision area.  This is well after incision is healed.  We could not get this wound to heal with soaks, wound care and antibiotics. He presents for irrigation and debridement of this area.  X-rays have not shown evidence of osteomyelitis either.  He agrees with proceeding with surgery after thorough discussion of risks and benefits.  He has worked on good glucose control.  PROCEDURE DESCRIPTION:  After informed consent was obtained, appropriate left foot was marked.  He was brought to the operating room, placed supine on the operating table.  General anesthesia was then obtained. His left foot was prepped and draped with DuraPrep and sterile drapes. Time-out was called and he was identified as correct patient and correct left foot.  We  then used an Esmarch around the ankle as a local tourniquet and wrapped this around tight.  We then made incision over the end of the first ray where his open wound was and ellipsed the wound out.  We found no gross purulence and we went ahead down to the metatarsal head.  We cleaned soft tissue around that with a rongeur and we removed sharply with sharp excisional debridement of skin and fascia around this area.  We then used an oscillating saw and made a cut through the metatarsal proximal to the metatarsal head of the first metatarsal wound, again found no evidence of osteomyelitis, but we were able to debride the bone back to I felt like as a stable margin.  We then irrigated the soft tissue with normal saline solution.  We reapproximated the skin with interrupted 2-0 nylon suture.  Xeroform and well-padded sterile dressing applied.  He was awakened, extubated and taken to the recovery room in stable condition. All final counts were correct.  There were no complications noted.     Vanita Panda. Magnus Ivan, M.D.     CYB/MEDQ  D:  01/11/2017  T:  01/11/2017  Job:  [id number]

## 2017-01-26 ENCOUNTER — Ambulatory Visit (INDEPENDENT_AMBULATORY_CARE_PROVIDER_SITE_OTHER): Payer: Self-pay | Admitting: Physician Assistant

## 2017-01-26 ENCOUNTER — Encounter (INDEPENDENT_AMBULATORY_CARE_PROVIDER_SITE_OTHER): Payer: Self-pay | Admitting: Physician Assistant

## 2017-01-26 DIAGNOSIS — M868X7 Other osteomyelitis, ankle and foot: Secondary | ICD-10-CM

## 2017-01-26 NOTE — Progress Notes (Signed)
  Mr. Douglas Murphy returns today 2 weeks status post irrigation and debridement left foot first ray which included sharp excisional debridement skin and shortening of the first metatarsal. There was no evidence of purulence or evidence of osteomyelitis a reports now that he is finished with his antibiotics. He is had no fevers chills shortness breath or generalized malaise.  Right foot first ray interrupted nylon sutures well approximated incision. The mid incision still not completely healed the proximal distal ends are completely healed. Sutures distal and proximal ends were removed today. No signs of gross purulence or encounter. Dorsal pedal pulses 2+.   Plan: Abdomen follow back in 1 week for removal of the remaining 3 sutures. Is able to get the incision wet in the shower dry completely and apply Bactroban once daily than later to the mid incision.

## 2017-01-27 IMAGING — DX DG CERVICAL SPINE COMPLETE 4+V
5 series · 5 of 5 positions shown · non-contrast
Comparison: None.

CLINICAL DATA: Lifting injury. Initial encounter. Numbness in the
shoulders and LEFT hand.

EXAM:
CERVICAL SPINE  4+ VIEWS

[c-spine lat]
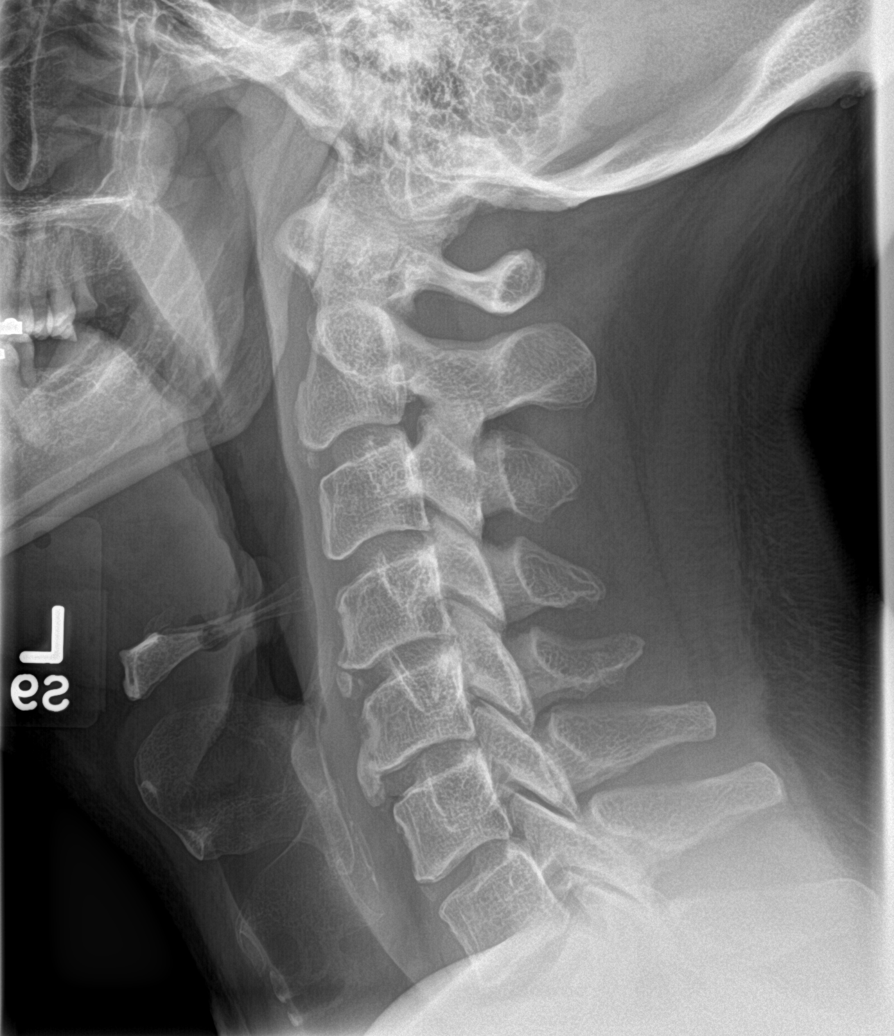

[c-spine obl (1 of 2)]
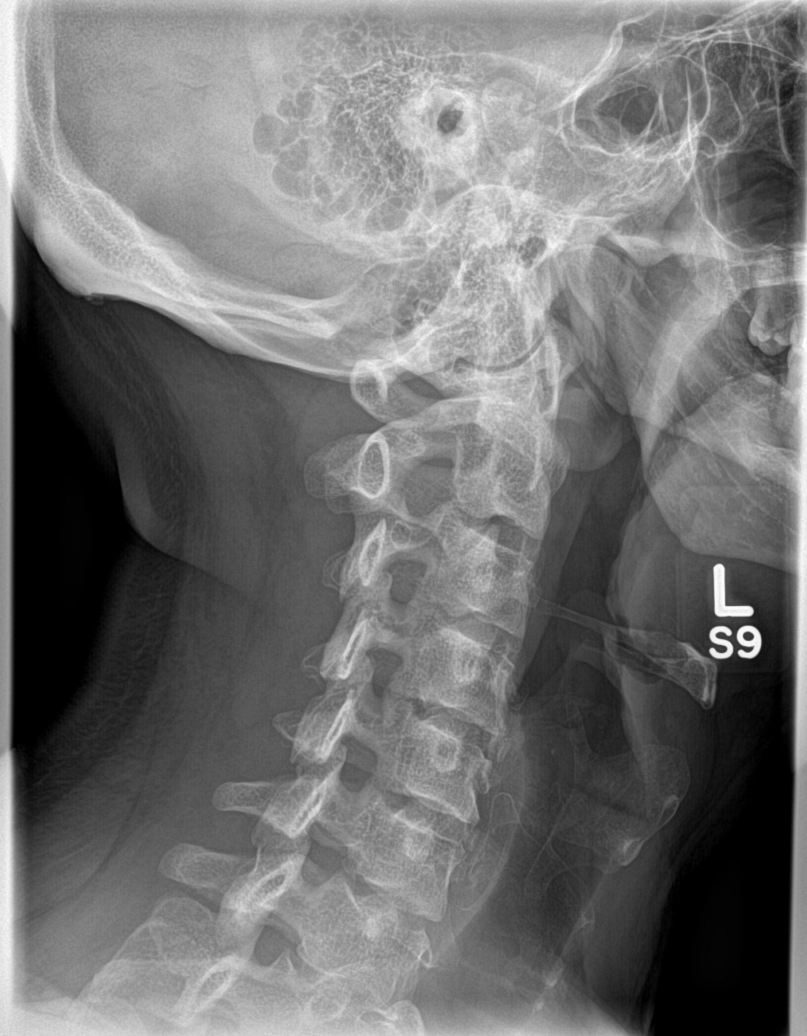

[c-spine obl (2 of 2)]
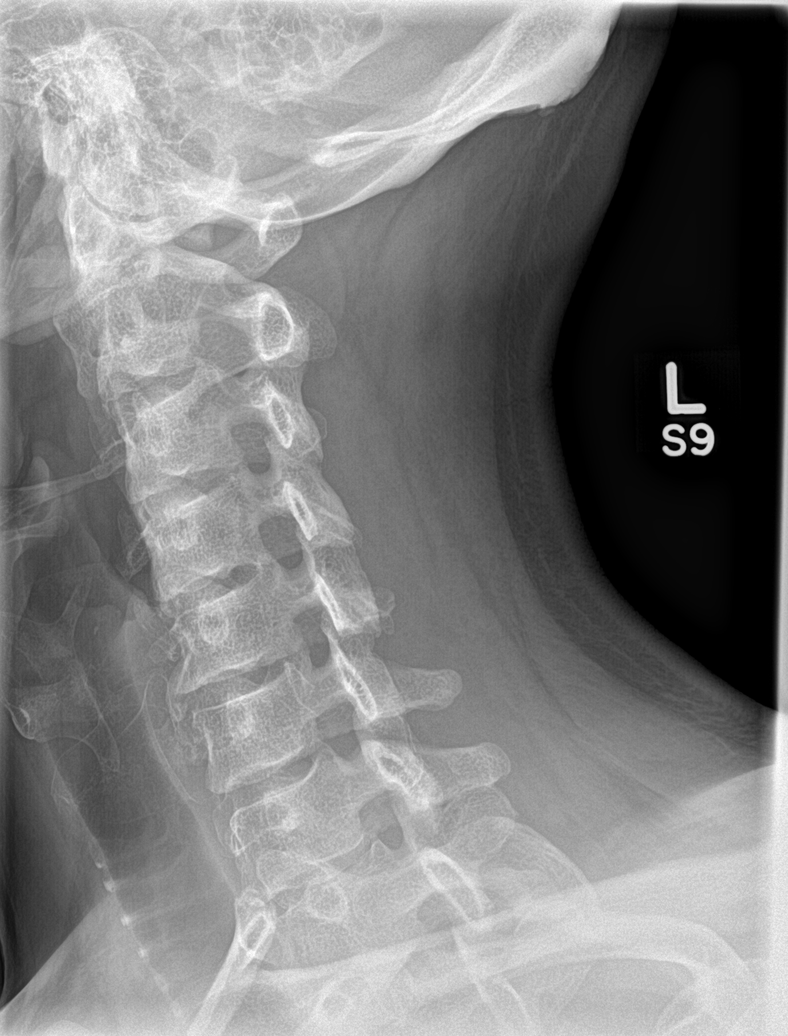

[c-spine ap]
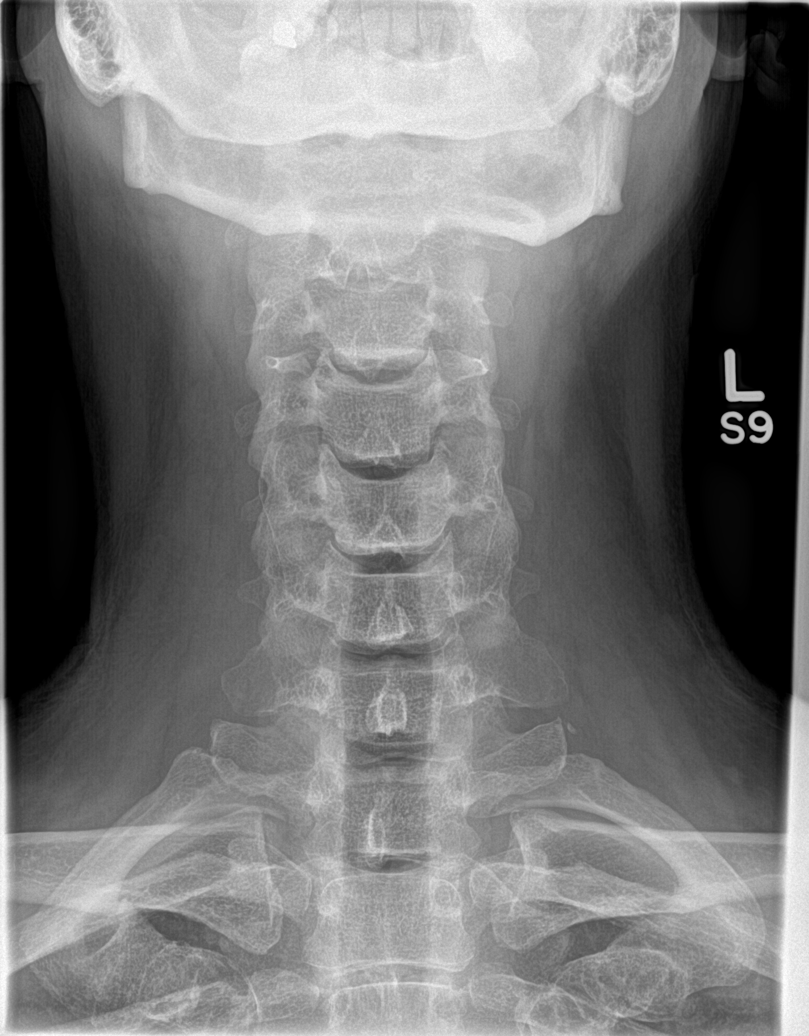

[c-spine open mouth]
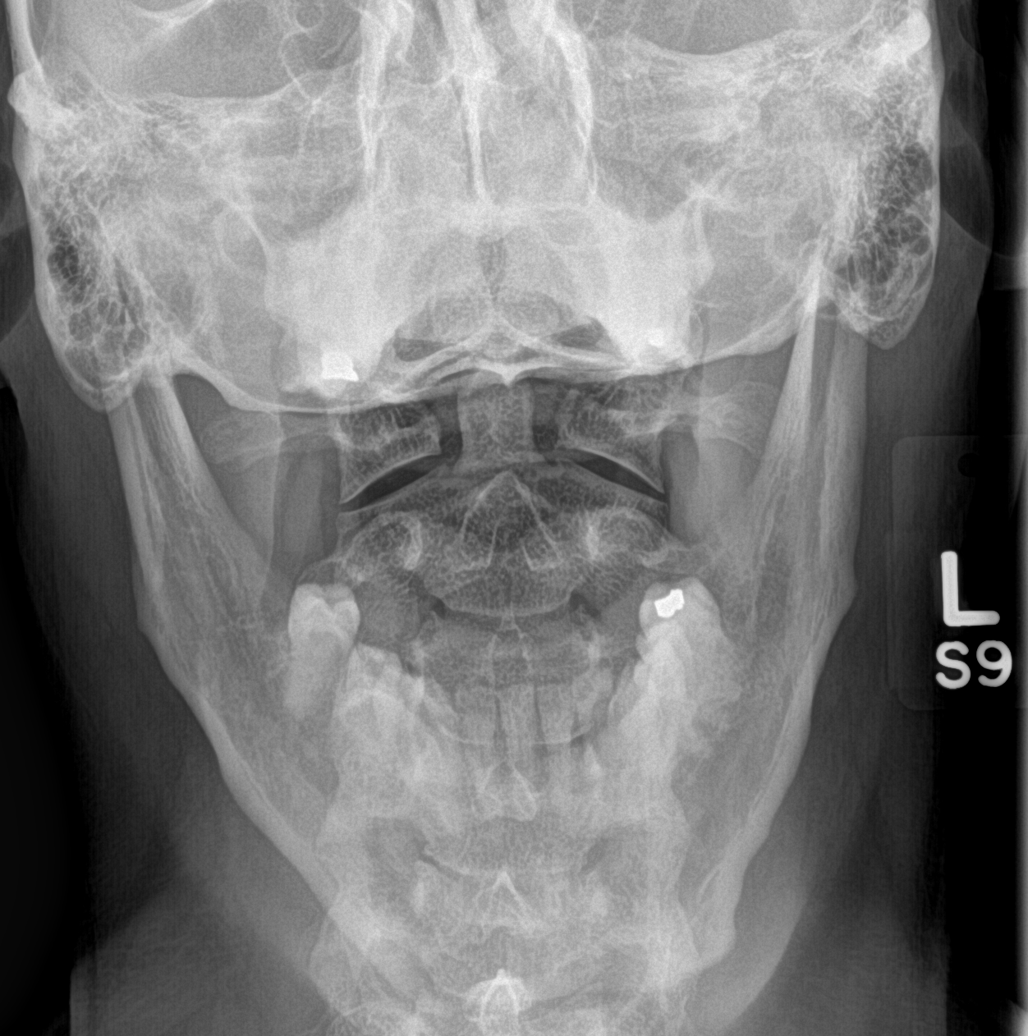

[5 of 5 positions shown; findings below may reference images not displayed]

FINDINGS: Anatomic alignment of the cervical spine. Anterior longitudinal
ligament calcification and annular calcification is present. The
intervertebral disc spaces are preserved. Vertebral body height is
preserved. There is mild bony foraminal stenosis on the RIGHT at
C4-C5. Mild bony foraminal stenosis is present on the LEFT at C5-C6.
Odontoid is intact.
IMPRESSION: No acute osseous abnormality. Mild bony foraminal stenosis on the
RIGHT at C4-C5 secondary to uncovertebral spurring and facet
arthrosis. Similar changes but less pronounced on the LEFT at C5-C6.

## 2017-02-02 ENCOUNTER — Encounter (INDEPENDENT_AMBULATORY_CARE_PROVIDER_SITE_OTHER): Payer: Self-pay | Admitting: Physician Assistant

## 2017-02-02 ENCOUNTER — Ambulatory Visit (INDEPENDENT_AMBULATORY_CARE_PROVIDER_SITE_OTHER): Payer: Self-pay | Admitting: Physician Assistant

## 2017-02-02 VITALS — Ht 72.0 in | Wt 195.0 lb

## 2017-02-02 DIAGNOSIS — M86172 Other acute osteomyelitis, left ankle and foot: Secondary | ICD-10-CM

## 2017-02-02 NOTE — Progress Notes (Signed)
Douglas Murphy returns today now 22 days status post irrigation and debridement left foot first ray wound. There is overall doing well no fevers chills.Marland Kitchen. He is on the doxycycline twice a day.  Left foot: Surgical incisions healing well. Remaining sutures well approximated wound. Does have some early scab formation over the mid incision. No drainage. No gross signs of infection.  Plan: He'll continue his doxycycline until he is finished with a prescription. Wash the foot with antibacterial soap daily dry and then apply a small amount of Bactroban. We'll keep him out of work until follow-up in 2 weeks. Continue postop shoe.

## 2017-02-16 ENCOUNTER — Ambulatory Visit (INDEPENDENT_AMBULATORY_CARE_PROVIDER_SITE_OTHER): Payer: Self-pay | Admitting: Physician Assistant

## 2017-02-16 ENCOUNTER — Encounter (INDEPENDENT_AMBULATORY_CARE_PROVIDER_SITE_OTHER): Payer: Self-pay | Admitting: Physician Assistant

## 2017-02-16 DIAGNOSIS — M86172 Other acute osteomyelitis, left ankle and foot: Secondary | ICD-10-CM

## 2017-02-16 NOTE — Progress Notes (Signed)
Mr. Douglas Murphy returns today follow-up of irrigation and debridement left foot first ray wound now 36 days postop. He is overall doing well and is a fevers chills. He's been applying Bactroban once daily to the wound. Talus is stiffness in the bottoms of both feet and points to the second third metatarsal heads of the left foot.  Left foot first ray wound is healing well no expressible purulence. There is no erythema no signs of infection. Wound is not completely healed but is scabbed over. There is no others signs of breakdown or impending ulcers left foot. He has no tenderness over the second third metatarsal head region. Tight gastroc on the left.  Plan: He will work on gastrocsoleus stretching. Discontinue Bactroban. He A regular shoe with a Band-Aid over the wound. Keep wound clean dry. Did discuss him with him no swimming or hot tubs until the wound is completely healed. Follow up with us in 1 month sooner if there is any questions or concerns. He'll return to work in 2 weeks full duties.

## 2017-03-21 ENCOUNTER — Encounter (INDEPENDENT_AMBULATORY_CARE_PROVIDER_SITE_OTHER): Payer: Self-pay | Admitting: Physician Assistant

## 2017-03-21 ENCOUNTER — Ambulatory Visit (INDEPENDENT_AMBULATORY_CARE_PROVIDER_SITE_OTHER): Payer: Self-pay | Admitting: Physician Assistant

## 2017-03-21 DIAGNOSIS — M869 Osteomyelitis, unspecified: Secondary | ICD-10-CM | POA: Insufficient documentation

## 2017-03-21 NOTE — Progress Notes (Signed)
[name] returns today to follow up on left foot first ray wound now 69 days status post irrigation and debridement of the wound. He states overall he is doing well. He is 6 months status post left great toe amputation. Does note that the right toe he's had a ulceration there for the next 2 weeks and slowly improving. He's had no fevers or chills. Is on no antibiotics.  Physical exam: Left great toe 8 dictation site is healing well there is some callus. No signs of infection no specimen purulence. Left great toe distal phalanx medial aspect he has a small ulcer. No expressible purulence.  Plan: We'll have him follow up with us in 1 month to to check both sites. Recommending Pl., Bactroban over the right great toe ulcer daily. Wash both the feet with an antibacterial soap.

## 2017-04-19 ENCOUNTER — Ambulatory Visit (INDEPENDENT_AMBULATORY_CARE_PROVIDER_SITE_OTHER): Payer: Self-pay | Admitting: Physician Assistant

## 2017-04-19 DIAGNOSIS — M869 Osteomyelitis, unspecified: Secondary | ICD-10-CM

## 2017-04-19 NOTE — Progress Notes (Signed)
Mr. Douglas Murphy returns today for wound check status post irrigation and debridement left foot first ray when now 98 days postop. Overall doing well. He states he's having no robins with the left foot. He does have an area that he wants is to check on the right great toe. He's had no fevers chills shortness breath chest pain .  Left foot amputation.site first ray is healing well no signs of infection. No abnormal warmth erythema. Right great toe area as area medial distal phalanx where there is a scab from a blister but no signs of infection. No expressible purulence. No abnormal warmth erythema. Bilateral feet dorsal pedal pulses are intact.  Impression:Status post irrigation debridement left foot first ray wound.  Right great toe healing blister.  Plan: He'll continue to work on scar tissue mobilization left great toe incision site. In regards to the right great toe and advised him to soak in a antibacterial soap daily. Then apply a thin layer of Bactroban. He'll follow up with us on an as-needed basis if there is any questions or concerns.

## 2017-05-05 NOTE — Addendum Note (Signed)
Addendum  created 05/05/17 0942 by Velisa Regnier, MD   Sign clinical note    

## 2017-10-10 ENCOUNTER — Observation Stay (HOSPITAL_COMMUNITY): Payer: Self-pay

## 2017-10-10 ENCOUNTER — Other Ambulatory Visit: Payer: Self-pay

## 2017-10-10 ENCOUNTER — Encounter (HOSPITAL_COMMUNITY): Payer: Self-pay

## 2017-10-10 ENCOUNTER — Inpatient Hospital Stay (HOSPITAL_COMMUNITY)
Admission: EM | Admit: 2017-10-10 | Discharge: 2017-10-12 | DRG: 638 | Disposition: A | Discharge status: Home / Self Care | Payer: Self-pay | Attending: Internal Medicine | Admitting: Internal Medicine

## 2017-10-10 DIAGNOSIS — Z79899 Other long term (current) drug therapy: Secondary | ICD-10-CM

## 2017-10-10 DIAGNOSIS — R000 Tachycardia, unspecified: Secondary | ICD-10-CM | POA: Diagnosis present

## 2017-10-10 DIAGNOSIS — Z23 Encounter for immunization: Secondary | ICD-10-CM

## 2017-10-10 DIAGNOSIS — Z794 Long term (current) use of insulin: Secondary | ICD-10-CM

## 2017-10-10 DIAGNOSIS — T383X6A Underdosing of insulin and oral hypoglycemic [antidiabetic] drugs, initial encounter: Secondary | ICD-10-CM | POA: Diagnosis present

## 2017-10-10 DIAGNOSIS — E1110 Type 2 diabetes mellitus with ketoacidosis without coma: Principal | ICD-10-CM | POA: Diagnosis present

## 2017-10-10 DIAGNOSIS — R05 Cough: Secondary | ICD-10-CM

## 2017-10-10 DIAGNOSIS — R402413 Glasgow coma scale score 13-15, at hospital admission: Secondary | ICD-10-CM | POA: Diagnosis present

## 2017-10-10 DIAGNOSIS — N179 Acute kidney failure, unspecified: Secondary | ICD-10-CM | POA: Diagnosis present

## 2017-10-10 DIAGNOSIS — R112 Nausea with vomiting, unspecified: Secondary | ICD-10-CM | POA: Diagnosis present

## 2017-10-10 DIAGNOSIS — Z91018 Allergy to other foods: Secondary | ICD-10-CM

## 2017-10-10 DIAGNOSIS — R059 Cough, unspecified: Secondary | ICD-10-CM

## 2017-10-10 DIAGNOSIS — Z8639 Personal history of other endocrine, nutritional and metabolic disease: Secondary | ICD-10-CM

## 2017-10-10 DIAGNOSIS — I10 Essential (primary) hypertension: Secondary | ICD-10-CM | POA: Diagnosis present

## 2017-10-10 DIAGNOSIS — Z89412 Acquired absence of left great toe: Secondary | ICD-10-CM

## 2017-10-10 DIAGNOSIS — E119 Type 2 diabetes mellitus without complications: Secondary | ICD-10-CM

## 2017-10-10 DIAGNOSIS — E1169 Type 2 diabetes mellitus with other specified complication: Secondary | ICD-10-CM | POA: Diagnosis present

## 2017-10-10 DIAGNOSIS — M869 Osteomyelitis, unspecified: Secondary | ICD-10-CM | POA: Diagnosis present

## 2017-10-10 DIAGNOSIS — R739 Hyperglycemia, unspecified: Secondary | ICD-10-CM

## 2017-10-10 HISTORY — DX: Osteomyelitis, unspecified: M86.9

## 2017-10-10 HISTORY — DX: Acute kidney failure, unspecified: N17.9

## 2017-10-10 HISTORY — DX: Tachycardia, unspecified: R00.0

## 2017-10-10 LAB — URINALYSIS, ROUTINE W REFLEX MICROSCOPIC
BACTERIA UA: NONE SEEN
Bilirubin Urine: NEGATIVE
Glucose, UA: >=500 mg/dL — AB
Ketones, ur: 20 mg/dL — AB
Leukocytes, UA: NEGATIVE
Nitrite: NEGATIVE
PROTEIN: NEGATIVE mg/dL
SPECIFIC GRAVITY, URINE: 1.027 (ref 1.005–1.030)
SQUAMOUS EPITHELIAL / LPF: NONE SEEN
WBC, UA: NONE SEEN WBC/hpf (ref 0–5)
pH: 6 (ref 5.0–8.0)

## 2017-10-10 LAB — BASIC METABOLIC PANEL
ANION GAP: 10 (ref 5–15)
BUN: 22 mg/dL — ABNORMAL HIGH (ref 6–20)
CHLORIDE: 106 mmol/L (ref 101–111)
CO2: 27 mmol/L (ref 22–32)
Calcium: 9 mg/dL (ref 8.9–10.3)
Creatinine, Ser: 0.97 mg/dL (ref 0.61–1.24)
GFR calc non Af Amer: >60 mL/min (ref >60)
Glucose, Bld: 205 mg/dL — ABNORMAL HIGH (ref 65–99)
Potassium: 4.5 mmol/L (ref 3.5–5.1)
Sodium: 143 mmol/L (ref 135–145)

## 2017-10-10 LAB — COMPREHENSIVE METABOLIC PANEL
ALBUMIN: 4.5 g/dL (ref 3.5–5.0)
ALK PHOS: 85 U/L (ref 38–126)
ALT: 71 U/L — ABNORMAL HIGH (ref 17–63)
ANION GAP: 16 — AB (ref 5–15)
AST: 40 U/L (ref 15–41)
BUN: 30 mg/dL — ABNORMAL HIGH (ref 6–20)
CALCIUM: 10 mg/dL (ref 8.9–10.3)
CHLORIDE: 95 mmol/L — AB (ref 101–111)
CO2: 27 mmol/L (ref 22–32)
Creatinine, Ser: 1.43 mg/dL — ABNORMAL HIGH (ref 0.61–1.24)
GFR calc Af Amer: >60 mL/min (ref >60)
GFR calc non Af Amer: 56 mL/min — ABNORMAL LOW (ref >60)
GLUCOSE: 462 mg/dL — AB (ref 65–99)
POTASSIUM: 4.4 mmol/L (ref 3.5–5.1)
SODIUM: 138 mmol/L (ref 135–145)
Total Bilirubin: 0.9 mg/dL (ref 0.3–1.2)
Total Protein: 8.1 g/dL (ref 6.5–8.1)

## 2017-10-10 LAB — HEMOGLOBIN A1C
Hgb A1c MFr Bld: 9 % — ABNORMAL HIGH (ref 4.8–5.6)
Mean Plasma Glucose: 211.6 mg/dL

## 2017-10-10 LAB — CBC
HEMATOCRIT: 43 % (ref 39.0–52.0)
HEMOGLOBIN: 15 g/dL (ref 13.0–17.0)
MCH: 30.2 pg (ref 26.0–34.0)
MCHC: 34.9 g/dL (ref 30.0–36.0)
MCV: 86.5 fL (ref 78.0–100.0)
Platelets: 283 10*3/uL (ref 150–400)
RBC: 4.97 MIL/uL (ref 4.22–5.81)
RDW: 12.9 % (ref 11.5–15.5)
WBC: 15.3 10*3/uL — ABNORMAL HIGH (ref 4.0–10.5)

## 2017-10-10 LAB — GLUCOSE, CAPILLARY: GLUCOSE-CAPILLARY: 220 mg/dL — AB (ref 65–99)

## 2017-10-10 LAB — CBG MONITORING, ED
GLUCOSE-CAPILLARY: 410 mg/dL — AB (ref 65–99)
Glucose-Capillary: 420 mg/dL — ABNORMAL HIGH (ref 65–99)
Glucose-Capillary: 290 mg/dL — ABNORMAL HIGH (ref 65–99)
Glucose-Capillary: 208 mg/dL — ABNORMAL HIGH (ref 65–99)

## 2017-10-10 LAB — TSH: TSH: 0.349 u[IU]/mL — ABNORMAL LOW (ref 0.350–4.500)

## 2017-10-10 LAB — LIPASE, BLOOD: Lipase: 20 U/L (ref 11–51)

## 2017-10-10 MED ORDER — SODIUM CHLORIDE 0.9 % IV BOLUS (SEPSIS)
1000.0000 mL | Freq: Once | INTRAVENOUS | Status: AC
  Administered 2017-10-10: 1000 mL via INTRAVENOUS

## 2017-10-10 MED ORDER — HEPARIN SODIUM (PORCINE) 5000 UNIT/ML IJ SOLN
5000.0000 [IU] | Freq: Three times a day (TID) | INTRAMUSCULAR | Status: DC
  Administered 2017-10-10 – 2017-10-11 (×2): 5000 [IU] via SUBCUTANEOUS
  Filled 2017-10-10 (×3): qty 1

## 2017-10-10 MED ORDER — PROMETHAZINE HCL 25 MG/ML IJ SOLN
12.5000 mg | Freq: Four times a day (QID) | INTRAMUSCULAR | Status: DC | PRN
Start: 2017-10-10 — End: 2017-10-11
  Administered 2017-10-10: 25 mg via INTRAVENOUS
  Administered 2017-10-11: 12.5 mg via INTRAVENOUS
  Filled 2017-10-10 (×2): qty 1

## 2017-10-10 MED ORDER — ONDANSETRON HCL 4 MG/2ML IJ SOLN
4.0000 mg | Freq: Four times a day (QID) | INTRAMUSCULAR | Status: AC
  Administered 2017-10-10: 4 mg via INTRAVENOUS
  Filled 2017-10-10: qty 2

## 2017-10-10 MED ORDER — INSULIN ASPART 100 UNIT/ML ~~LOC~~ SOLN
0.0000 [IU] | Freq: Every day | SUBCUTANEOUS | Status: DC
  Administered 2017-10-10: 2 [IU] via SUBCUTANEOUS

## 2017-10-10 MED ORDER — INSULIN ASPART 100 UNIT/ML ~~LOC~~ SOLN
0.0000 [IU] | Freq: Three times a day (TID) | SUBCUTANEOUS | Status: DC
  Administered 2017-10-10 – 2017-10-11 (×2): 3 [IU] via SUBCUTANEOUS
  Administered 2017-10-11 (×2): 2 [IU] via SUBCUTANEOUS
  Filled 2017-10-10: qty 1

## 2017-10-10 MED ORDER — INFLUENZA VAC SPLIT QUAD 0.5 ML IM SUSY
0.5000 mL | PREFILLED_SYRINGE | INTRAMUSCULAR | Status: AC
  Administered 2017-10-11: 0.5 mL via INTRAMUSCULAR
  Filled 2017-10-10: qty 0.5

## 2017-10-10 MED ORDER — ONDANSETRON 4 MG PO TBDP
4.0000 mg | ORAL_TABLET | Freq: Once | ORAL | Status: AC | PRN
  Administered 2017-10-10: 4 mg via ORAL
  Filled 2017-10-10: qty 1

## 2017-10-10 MED ORDER — INSULIN GLARGINE 100 UNIT/ML ~~LOC~~ SOLN
10.0000 [IU] | Freq: Every day | SUBCUTANEOUS | Status: DC
  Administered 2017-10-10: 10 [IU] via SUBCUTANEOUS
  Filled 2017-10-10 (×2): qty 0.1

## 2017-10-10 MED ORDER — IBUPROFEN 400 MG PO TABS
400.0000 mg | ORAL_TABLET | Freq: Two times a day (BID) | ORAL | Status: DC | PRN
Start: 2017-10-10 — End: 2017-10-10

## 2017-10-10 MED ORDER — SODIUM CHLORIDE 0.9 % IV SOLN
INTRAVENOUS | Status: AC
  Administered 2017-10-10 – 2017-10-11 (×3): via INTRAVENOUS

## 2017-10-10 MED ORDER — ACETAMINOPHEN 325 MG PO TABS
650.0000 mg | ORAL_TABLET | Freq: Once | ORAL | Status: AC
  Administered 2017-10-10: 650 mg via ORAL
  Filled 2017-10-10: qty 2

## 2017-10-10 MED ORDER — INSULIN ASPART 100 UNIT/ML ~~LOC~~ SOLN
10.0000 [IU] | Freq: Once | SUBCUTANEOUS | Status: AC
Start: 2017-10-10 — End: 2017-10-10
  Administered 2017-10-10: 10 [IU] via SUBCUTANEOUS
  Filled 2017-10-10: qty 1

## 2017-10-10 NOTE — ED Triage Notes (Signed)
Patient complains of abdominal pain and vomiting that started yesterday. Denies diarrhea. Alert and oriented

## 2017-10-10 NOTE — ED Notes (Signed)
Attempted to obtain blood x2.

## 2017-10-10 NOTE — H&P (Signed)
History and Physical    Douglas Murphy NWG:[id number] DOB: 08-Jul-1966 DOA: 10/10/2017  PCP: [name], MD Patient coming from: home  Chief Complaint: abdominal pain/persistent nausea and vomiting  HPI: Douglas Murphy is a 52 y.o. male with medical history significant diabetes on insulin, hypertension, osteomyelitis status post amputation of his left great toe presents to the emergency Department chief complaint of abdominal pain/persistent nausea vomiting. Initial evaluation reveals serum glucose 462 anion gap 16 bicarbonate within the limits of normal and acute kidney injury. [facility] are asked to admit  Information is obtained from the patient and the chart. Patient states he's been compliant with his medications but not so much with his diet stating he eats Pop tarts for breakfast. Yesterday he developed "queasy" stomach. He reports emesis. He denies coffee ground emesis. He states he feels like he was vomiting every hour and most recently dry heaving. Complains of a sore throat related to all the vomiting. He denies chest pain palpitation shortness of breath. He denies cough fever chills dysuria hematuria frequency or urgency. He denies diarrhea constipation melena bright red blood per rectum. Reports compliance with his medications and states he checks his blood sugar twice a day.    ED Course: In the emergency department he's afebrile hemodynamically stable with mild tachycardia. He is given 10 units of NovoLog 2 units of normal saline  Review of Systems: As per HPI otherwise all other systems reviewed and are negative.   Ambulatory Status: Ambulates independently works part-time with [name] lives at home with his mother independent with ADLs but not at all sure he is capable of the responsibility  Past Medical History:  Diagnosis Date  . Acute kidney injury (HCC)   . Diabetes mellitus without complication (HCC)    Type II  . Osteomyelitis (HCC)   . Tachycardia      Past Surgical History:  Procedure Laterality Date  . AMPUTATION TOE Left 09/25/2016   Procedure: AMPUTATION GREAT TOE;  Surgeon: Kathryne Hitch, MD;  Location: [facility] OR;  Service: Orthopedics;  Laterality: Left;  . I&D EXTREMITY Left 01/11/2017   Procedure: IRRIGATION AND DEBRIDEMENT LEFT FOOT FIRST RAY WOUND;  Surgeon: Kathryne Hitch, MD;  Location: [facility] OR;  Service: Orthopedics;  Laterality: Left;  . KNEE ARTHROSCOPY Right    x2  . TONSILLECTOMY    . TYMPANOSTOMY TUBE PLACEMENT      Social History   Socioeconomic History  . Marital status: Single    Spouse name: Not on file  . Number of children: Not on file  . Years of education: Not on file  . Highest education level: Not on file  Social Needs  . Financial resource strain: Not on file  . Food insecurity - worry: Not on file  . Food insecurity - inability: Not on file  . Transportation needs - medical: Not on file  . Transportation needs - non-medical: Not on file  Occupational History  . Not on file  Tobacco Use  . Smoking status: Never Smoker  . Smokeless tobacco: Never Used  Substance and Sexual Activity  . Alcohol use: No  . Drug use: No  . Sexual activity: Not on file  Other Topics Concern  . Not on file  Social History Narrative  . Not on file    Allergies  Allergen Reactions  . Other Anaphylaxis    [place] nuts     Family History  Problem Relation Age of Onset  . Hypertension Mother  Prior to Admission medications   Medication Sig Start Date End Date Taking? Authorizing Provider  aspirin-sod bicarb-citric acid (ALKA-SELTZER) 325 MG TBEF tablet Take 650 mg by mouth daily as needed (indigestion).   Yes [provider]  bismuth subsalicylate (PEPTO BISMOL) 262 MG/15ML suspension Take 30 mLs by mouth every 6 (six) hours as needed for indigestion.   Yes [provider]  calcium carbonate (TUMS - DOSED IN MG ELEMENTAL CALCIUM) 500 MG chewable tablet Chew 3 tablets by  mouth daily as needed for indigestion or heartburn.   Yes [provider]  ibuprofen (ADVIL,MOTRIN) 200 MG tablet Take 400 mg by mouth 2 (two) times daily as needed for moderate pain.    Yes [provider]  insulin NPH-regular Human (NOVOLIN 70/30) (70-30) 100 UNIT/ML injection Inject 30 Units into the skin 2 (two) times daily.   Yes [provider]  lisinopril (PRINIVIL,ZESTRIL) 5 MG tablet Take 5 mg by mouth daily.   Yes [provider]  metFORMIN (GLUCOPHAGE) 1000 MG tablet Take 1,000 mg by mouth 2 (two) times daily.   Yes [provider]    Physical Exam: Vitals:   10/10/17 1015 10/10/17 1100 10/10/17 1130 10/10/17 1200  BP: 117/81 (!) 121/93 114/72 122/88  Pulse: (!) 119 (!) 117 (!) 117 (!) 114  Resp:   16 15  Temp:      TempSrc:      SpO2: 97% 98% 97% 98%     General:  Appears calm and comfortable saying up in bed watching TV in no acute distress Eyes:  PERRL, EOMI, normal lids, iris ENT:  grossly normal hearing, lips & tongue, mucous membranes of his mouth are dry but pink Neck:  no LAD, masses or thyromegaly Cardiovascular:  RRR, no m/r/g. No LE edema. Pedal pulses present and palpable Respiratory:  CTA bilaterally, no w/r/r. Normal respiratory effort. Abdomen:  soft, ntnd, bowel sounds sluggish no guarding or rebounding Skin:  no rash or induration seen on limited exam Musculoskeletal:  grossly normal tone BUE/BLE, good ROM, no bony abnormality Psychiatric:  grossly normal mood and affect, speech fluent and appropriate, AOx3 Neurologic:  CN 2-12 grossly intact, moves all extremities in coordinated fashion, sensation intact speech and facial symmetry is all extremities spontaneously  Labs on Admission: I have personally reviewed following labs and imaging studies  CBC: Recent Labs  Lab 10/10/17 1016  WBC 15.3*  HGB 15.0  HCT 43.0  MCV 86.5  PLT 283   Basic Metabolic Panel: Recent Labs  Lab 10/10/17 1016  NA 138  K  4.4  CL 95*  CO2 27  GLUCOSE 462*  BUN 30*  CREATININE 1.43*  CALCIUM 10.0   GFR: CrCl cannot be calculated (Unknown ideal weight.). Liver Function Tests: Recent Labs  Lab 10/10/17 1016  AST 40  ALT 71*  ALKPHOS 85  BILITOT 0.9  PROT 8.1  ALBUMIN 4.5   Recent Labs  Lab 10/10/17 1016  LIPASE 20   No results for input(s): AMMONIA in the last 168 hours. Coagulation Profile: No results for input(s): INR, PROTIME in the last 168 hours. Cardiac Enzymes: No results for input(s): CKTOTAL, CKMB, CKMBINDEX, TROPONINI in the last 168 hours. BNP (last 3 results) No results for input(s): PROBNP in the last 8760 hours. HbA1C: No results for input(s): HGBA1C in the last 72 hours. CBG: Recent Labs  Lab 10/10/17 1112 10/10/17 1113  GLUCAP 420* 410*   Lipid Profile: No results for input(s): CHOL, HDL, LDLCALC, TRIG, CHOLHDL, LDLDIRECT in  the last 72 hours. Thyroid Function Tests: No results for input(s): TSH, T4TOTAL, FREET4, T3FREE, THYROIDAB in the last 72 hours. Anemia Panel: No results for input(s): VITAMINB12, FOLATE, FERRITIN, TIBC, IRON, RETICCTPCT in the last 72 hours. Urine analysis:    Component Value Date/Time   COLORURINE STRAW (A) 10/10/2017 0831   APPEARANCEUR CLEAR 10/10/2017 0831   LABSPEC 1.027 10/10/2017 0831   PHURINE 6.0 10/10/2017 0831   GLUCOSEU >=500 (A) 10/10/2017 0831   HGBUR SMALL (A) 10/10/2017 0831   BILIRUBINUR NEGATIVE 10/10/2017 0831   KETONESUR 20 (A) 10/10/2017 0831   PROTEINUR NEGATIVE 10/10/2017 0831   UROBILINOGEN 0.2 09/03/2012 1643   NITRITE NEGATIVE 10/10/2017 0831   LEUKOCYTESUR NEGATIVE 10/10/2017 0831    Creatinine Clearance: CrCl cannot be calculated (Unknown ideal weight.).  Sepsis Labs: @LABRCNTIP (procalcitonin:4,lacticidven:4) )No results found for this or any previous visit (from the past 240 hour(s)).   Radiological Exams on Admission: No results found.  EKG: Independently reviewed. Sinus tachycardia Right axis  deviation  Assessment/Plan Principal Problem:   Acute kidney injury (HCC) Active Problems:   Diabetes mellitus (HCC)   Toe osteomyelitis (HCC)   Tachycardia   Intractable nausea and vomiting   #1. Acute kidney injury. Likely related to uncontrolled diabetes in the setting of lisinopril. Ativan 1.4 on admission. -Admit -Hold nephrotoxins -Vigorous IV fluids -Approved diabetes control -Monitor urine output -bmet in am  #2. Diabetes. Uncontrolled. Serum glucose 462. Gap 16 bicarb 27. He is provided with 2 L of normal saline 10 units of NovoLog. No signs infection. Question ability to comply. I got the distinct impression that he believes as long as he takes his medicine his diabetes will be controlled -Monitor on telemetry -Another liter of normal saline right now -continue vigorous IV fluids at 125/hr -chest xray -Hold his 70/30 insulin for now -Hold metformin for now -lantus 10 now and every evening starting tomorrow -Obtain a hemoglobin A1c -Sliding scale insulin for optimal control -check bmet at 1400 -Diabetes  coordinator consult to assist with education  #3. Tachycardia. Likely related to above. Review does indicate he has a tendency towards heart rate high end of normal. He received 2 L of normal saline. No chest pain -ekg -chest xray -monitor  #4. Intractable nausea and vomiting. Likely related to above. Improved since his Zofran. -We will give scheduled Zofran for 2 doses -Continue with when necessary Zofran -Start on clear liquid diet -Advance as tolerated    DVT prophylaxis: heparin  Code Status: full  Family Communication: none present  Disposition Plan: home  Consults called: none  Admission status: obs    Toya SmothersBLACK,KAREN M MD [facility]  If 7PM-7AM, please contact night-coverage [email or web address] Password [email or web address]  10/10/2017, 1:09 PM

## 2017-10-10 NOTE — ED Notes (Signed)
Portable CXR at bedside at this time

## 2017-10-10 NOTE — ED Provider Notes (Signed)
[facility] EMERGENCY DEPARTMENT Provider Note   CSN: [id number] Arrival date & time: 10/10/17  0701     History   Chief Complaint Chief Complaint  Patient presents with  . Emesis    HPI Douglas Murphy is a 52 y.o. male with a history of insulin-dependent type 2 diabetes who presents to the emergency department today for nausea and emesis.  Patient states that yesterday at approximately 2:30 PM while over at a friend's house he started developing a "queasy" feeling in his stomach was followed by nausea and nonbilious, nonbloody emesis.  He states that after this he had emesis every hour on the hour with persistent nausea until he was given Zofran in the department.  Patient does note that his last episode of emesis had tiny small streak of blood that he noted.  He is now complaining of burning, dry sore throat from the emesis.  He denies any fever, chills, chest pain, shortness of breath, abdominal pain, diarrhea or urinary symptoms.  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  Only food that he ate prior to onset was 2 pop-tarts that morning.  Last bowel movement this morning and normal.  No previous abdominal surgeries.  He is still passing gas.  HPI  Past Medical History:  Diagnosis Date  . Diabetes mellitus without complication (HCC)    Type II    Patient Active Problem List   Diagnosis Date Noted  . Toe osteomyelitis (HCC) 03/21/2017  . Osteomyelitis of left foot (HCC) 09/24/2016  . Diabetes mellitus (HCC)     Past Surgical History:  Procedure Laterality Date  . AMPUTATION TOE Left 09/25/2016   Procedure: AMPUTATION GREAT TOE;  Surgeon: Kathryne Hitch, MD;  Location: [facility] OR;  Service: Orthopedics;  Laterality: Left;  . I&D EXTREMITY Left 01/11/2017   Procedure: IRRIGATION AND DEBRIDEMENT LEFT FOOT FIRST RAY WOUND;  Surgeon: Kathryne Hitch, MD;  Location: [facility] OR;  Service: Orthopedics;  Laterality:  Left;  . KNEE ARTHROSCOPY Right    x2  . TONSILLECTOMY    . TYMPANOSTOMY TUBE PLACEMENT         Home Medications    Prior to Admission medications   Medication Sig Start Date End Date Taking? Authorizing Provider  aspirin-sod bicarb-citric acid (ALKA-SELTZER) 325 MG TBEF tablet Take 650 mg by mouth daily as needed (indigestion).    [provider]  calcium carbonate (TUMS - DOSED IN MG ELEMENTAL CALCIUM) 500 MG chewable tablet Chew 3 tablets by mouth daily as needed for indigestion or heartburn.    [provider]  doxycycline (VIBRA-TABS) 100 MG tablet Take 1 tablet (100 mg total) by mouth 2 (two) times daily. 01/04/17   Kathryne Hitch, MD  HYDROcodone-acetaminophen (NORCO) 5-325 MG tablet Take 1-2 tablets by mouth every 4 (four) hours as needed for moderate pain. 01/11/17   Kathryne Hitch, MD  ibuprofen (ADVIL,MOTRIN) 200 MG tablet Take 400 mg by mouth 2 (two) times daily as needed for moderate pain.     [provider]  metFORMIN (GLUCOPHAGE) 1000 MG tablet Take 1,000 mg by mouth 2 (two) times daily.    [provider]  mupirocin ointment (BACTROBAN) 2 % Place 1 application into the nose 2 (two) times daily. 10/14/16   Kirtland Bouchard, PA-C    Family History No family history on file.  Social History Social History   Tobacco Use  . Smoking status: Never Smoker  .  Smokeless tobacco: Never Used  Substance Use Topics  . Alcohol use: No  . Drug use: No     Allergies   Other   Review of Systems Review of Systems  All other systems reviewed and are negative.    Physical Exam Updated Vital Signs BP 117/81   Pulse (!) 119   Temp 98.2 F (36.8 C) (Oral)   Resp 17   SpO2 97%   Physical Exam  Constitutional: He appears well-developed and well-nourished.  Patient resting comfortably in the exam bed, NAD.   HENT:  Head: Normocephalic and atraumatic.  Right Ear: External ear normal.  Left Ear: External ear normal.    Nose: Nose normal.  Mouth/Throat: Uvula is midline, oropharynx is clear and moist and mucous membranes are normal. No tonsillar exudate.  Eyes: Pupils are equal, round, and reactive to light. Right eye exhibits no discharge. Left eye exhibits no discharge. No scleral icterus.  Neck: Trachea normal. Neck supple. No spinous process tenderness present. No neck rigidity. Normal range of motion present.  Cardiovascular: Regular rhythm and intact distal pulses. Tachycardia present.  No murmur heard. Pulses:      Radial pulses are 2+ on the right side, and 2+ on the left side.       Dorsalis pedis pulses are 2+ on the right side, and 2+ on the left side.       Posterior tibial pulses are 2+ on the right side, and 2+ on the left side.  No lower extremity swelling or edema. Calves symmetric in size bilaterally.  Pulmonary/Chest: Effort normal and breath sounds normal. No tachypnea. No respiratory distress. He exhibits no tenderness.  No increased work of breathing. No accessory muscle use. Patient is sitting upright, speaking in full sentences without difficulty.  No kussmaul like breathing.  Abdominal: Soft. Bowel sounds are normal. He exhibits no distension. There is no tenderness. There is no rigidity, no rebound, no guarding, no CVA tenderness and negative Murphy's sign.  Musculoskeletal: He exhibits no edema.  Lymphadenopathy:    He has no cervical adenopathy.  Neurological: He is alert.  Skin: Skin is warm and dry. No rash noted. He is not diaphoretic.  Psychiatric: He has a normal mood and affect.  Nursing note and vitals reviewed.    ED Treatments / Results  Labs (all labs ordered are listed, but only abnormal results are displayed) Labs Reviewed  COMPREHENSIVE METABOLIC PANEL - Abnormal; Notable for the following components:      Result Value   Chloride 95 (*)    Glucose, Bld 462 (*)    BUN 30 (*)    Creatinine, Ser 1.43 (*)    ALT 71 (*)    GFR calc non Af Amer 56 (*)    Anion  gap 16 (*)    All other components within normal limits  CBC - Abnormal; Notable for the following components:   WBC 15.3 (*)    All other components within normal limits  URINALYSIS, ROUTINE W REFLEX MICROSCOPIC - Abnormal; Notable for the following components:   Color, Urine STRAW (*)    Glucose, UA >=500 (*)    Hgb urine dipstick SMALL (*)    Ketones, ur 20 (*)    All other components within normal limits  CBG MONITORING, ED - Abnormal; Notable for the following components:   Glucose-Capillary 420 (*)    All other components within normal limits  CBG MONITORING, ED - Abnormal; Notable for the following components:   Glucose-Capillary 410 (*)  All other components within normal limits  LIPASE, BLOOD    EKG  EKG Interpretation  Date/Time:  Monday October 10 2017 11:16:13 EST Ventricular Rate:  118 PR Interval:    QRS Duration: 93 QT Interval:  325 QTC Calculation: 456 R Axis:   128 Text Interpretation:  Sinus tachycardia Right axis deviation Confirmed by Bethann BerkshireZammit, Joseph [phone]([id number]) on 10/10/2017 12:34:42 PM       Radiology No results found.  Procedures Procedures (including critical care time)  Medications Ordered in ED Medications  sodium chloride 0.9 % bolus 1,000 mL (not administered)  ondansetron (ZOFRAN-ODT) disintegrating tablet 4 mg (4 mg Oral Given 10/10/17 0800)     Initial Impression / Assessment and Plan / ED Course  I have reviewed the triage vital signs and the nursing notes.  Pertinent labs & imaging results that were available during my care of the patient were reviewed by me and considered in my medical decision making (see chart for details).      52 year old insulin-dependent diabetic male presenting with persistent nausea and emesis and since yesterday.  There is no associated fever, abdominal pain, or diarrhea with this.  Patient is noted to be tachycardic on presentation.  Vital signs otherwise reassuring without any fever, tachypnea, hypoxia  or hypotension.  On exam the patient is in no acute distress.  Abdomen is soft, nontender and nondistended.  Mucous membranes appear to be dry.  Suspect patient is dehydrated.  Will give IV fluids and check a CBG, CMP, CBC, lipase and EKG to evaluate.  Patient labs consistent with acute kidney injury.  Patient's BUN and creatinine have doubled from prior.  Patient also noted to have glucose of 462 with an anion gap of 16. Question DKA. Discussed this with Dr. Estell HarpinZammit who feels we can start by treating with sq insulin and IVF hydration.  Serum bicarb within normal limits.  Potassium within normal limits.  Patient does have a leukocytosis of 15.3. Lipase normal. EKG with sinus tachycardia.   Patient given fluid bolus and subcu insulin.  His nausea is currently controlled with Zofran.  Feel the patient will need admission due to acute kidney injury and uncontrolled blood glucose.  On chart review patient's last A1c on 09/25/2016 was 12.4.  Dr. Ophelia CharterYates to admit the patient. He appears safe for admission.   Final Clinical Impressions(s) / ED Diagnoses   Final diagnoses:  Acute kidney injury (HCC)  Hyperglycemia  History of diabetes mellitus  Non-intractable vomiting with nausea, unspecified vomiting type    ED Discharge Orders    None       Princella PellegriniMaczis, Michael M, PA-C 10/10/17 1309    Bethann BerkshireZammit, Joseph, MD 10/15/17 1158

## 2017-10-10 NOTE — ED Notes (Signed)
Denies abdominal pain at this time. States throat hurts r/t vomiting.

## 2017-10-10 NOTE — ED Notes (Signed)
Patient c/o nausea. Emesis noted in bag. Will administer Zofran 4 mg IV.

## 2017-10-10 NOTE — Progress Notes (Signed)
Inpatient Diabetes Program Recommendations  AACE/ADA: New Consensus Statement on Inpatient Glycemic Control (2015)  Target Ranges:  Prepandial:   less than 140 mg/dL      Peak postprandial:   less than 180 mg/dL (1-2 hours)      Critically ill patients:  140 - 180 mg/dL   Lab Results  Component Value Date   GLUCAP 290 (H) 10/10/2017   HGBA1C 9.0 (H) 10/10/2017   Review of Glycemic Control  Diabetes history: DM 2 Outpatient Diabetes medications: 70/30 30 units BID (basal equivalent of 42 units and short acting insulin equivalent of 18 units), Metformin 1,000 mg BID Current orders for Inpatient glycemic control: Lantus 10 units QHS, Novolog Sensitive Correction 0-9 units tid + Novolog HS scale  Inpatient Diabetes Program Recommendations:    Spoke with patient about diabetes and home regimen for diabetes control. Patient reports that he is followed by Dr. Waynard EdwardsPerini, PCP for diabetes management. Patient states he last saw PCP around 2 weeks ago and thinks his A1c was around 7%. Patient does report he has "taken up drinking regular drinks again." Spoke with patient about the primary treatment for diabetes is through lifestyle changes and then the use of medications and needing to meet the medications halfway for glucose control. Discussed glucose and A1C goals. Discussed importance of checking CBGs and maintaining good CBG control to prevent long-term and short-term complications. Explained how hyperglycemia leads to damage within blood vessels which lead to the common complications seen with uncontrolled diabetes. Discussed impact of nutrition, exercise, stress, sickness, and medications on diabetes control.Admitting glucose in the 400's but patient did not take 70/30 dose the evening prior to coming in. Patient verbalized understanding of information discussed and he states that he has no further questions at this time related to diabetes.  Patient may benefit from a larger basal insulin dose based  on the amount of 70/30 he takes at home, Lantus 20 units (1/2 of patients home dose).   Thanks,  Christena DeemShannon Ilissa Rosner RN, MSN, [facility] Inpatient Diabetes Coordinator Team Pager [phone] (8a-5p)

## 2017-10-10 NOTE — ED Notes (Signed)
Phlebotomy at bedside at this time.

## 2017-10-11 ENCOUNTER — Other Ambulatory Visit: Payer: Self-pay

## 2017-10-11 DIAGNOSIS — Z8639 Personal history of other endocrine, nutritional and metabolic disease: Secondary | ICD-10-CM

## 2017-10-11 DIAGNOSIS — R000 Tachycardia, unspecified: Secondary | ICD-10-CM

## 2017-10-11 LAB — GLUCOSE, CAPILLARY
GLUCOSE-CAPILLARY: 164 mg/dL — AB (ref 65–99)
GLUCOSE-CAPILLARY: 224 mg/dL — AB (ref 65–99)
GLUCOSE-CAPILLARY: 190 mg/dL — AB (ref 65–99)
GLUCOSE-CAPILLARY: 179 mg/dL — AB (ref 65–99)

## 2017-10-11 LAB — BASIC METABOLIC PANEL
ANION GAP: 9 (ref 5–15)
BUN: 17 mg/dL (ref 6–20)
CO2: 24 mmol/L (ref 22–32)
CREATININE: 0.85 mg/dL (ref 0.61–1.24)
Calcium: 8 mg/dL — ABNORMAL LOW (ref 8.9–10.3)
Chloride: 106 mmol/L (ref 101–111)
GFR calc non Af Amer: >60 mL/min (ref >60)
Glucose, Bld: 181 mg/dL — ABNORMAL HIGH (ref 65–99)
POTASSIUM: 3.6 mmol/L (ref 3.5–5.1)
SODIUM: 139 mmol/L (ref 135–145)

## 2017-10-11 LAB — CBC
HCT: 36 % — ABNORMAL LOW (ref 39.0–52.0)
HEMOGLOBIN: 12.2 g/dL — AB (ref 13.0–17.0)
MCH: 29.7 pg (ref 26.0–34.0)
MCHC: 33.9 g/dL (ref 30.0–36.0)
MCV: 87.6 fL (ref 78.0–100.0)
PLATELETS: 241 10*3/uL (ref 150–400)
RBC: 4.11 MIL/uL — AB (ref 4.22–5.81)
RDW: 13.3 % (ref 11.5–15.5)
WBC: 13.1 10*3/uL — AB (ref 4.0–10.5)

## 2017-10-11 LAB — HIV ANTIBODY (ROUTINE TESTING W REFLEX): HIV Screen 4th Generation wRfx: NONREACTIVE

## 2017-10-11 MED ORDER — ONDANSETRON HCL 4 MG/2ML IJ SOLN
4.0000 mg | Freq: Four times a day (QID) | INTRAMUSCULAR | Status: DC | PRN
  Administered 2017-10-11 (×2): 4 mg via INTRAVENOUS
  Filled 2017-10-11 (×2): qty 2

## 2017-10-11 MED ORDER — ENOXAPARIN SODIUM 40 MG/0.4ML ~~LOC~~ SOLN
40.0000 mg | SUBCUTANEOUS | Status: DC
  Administered 2017-10-11: 40 mg via SUBCUTANEOUS
  Filled 2017-10-11: qty 0.4

## 2017-10-11 MED ORDER — SODIUM CHLORIDE 0.9 % IV SOLN
INTRAVENOUS | Status: DC
  Administered 2017-10-12 (×2): via INTRAVENOUS

## 2017-10-11 MED ORDER — INSULIN GLARGINE 100 UNIT/ML ~~LOC~~ SOLN
15.0000 [IU] | Freq: Every day | SUBCUTANEOUS | Status: DC
  Administered 2017-10-11: 15 [IU] via SUBCUTANEOUS
  Filled 2017-10-11 (×2): qty 0.15

## 2017-10-11 MED ORDER — ENOXAPARIN SODIUM 40 MG/0.4ML ~~LOC~~ SOLN: 40.0000 mg | SUBCUTANEOUS | Status: DC

## 2017-10-11 MED ORDER — INSULIN ASPART 100 UNIT/ML ~~LOC~~ SOLN
0.0000 [IU] | Freq: Three times a day (TID) | SUBCUTANEOUS | Status: DC
  Administered 2017-10-12 (×2): 2 [IU] via SUBCUTANEOUS

## 2017-10-11 MED ORDER — ACETAMINOPHEN 325 MG PO TABS
650.0000 mg | ORAL_TABLET | Freq: Four times a day (QID) | ORAL | Status: DC | PRN
  Administered 2017-10-11 – 2017-10-12 (×2): 650 mg via ORAL
  Filled 2017-10-11 (×2): qty 2

## 2017-10-11 NOTE — Progress Notes (Signed)
Patient arrived to the unit via bed from the emergency department.  Patient is alert and oriented x 4. Skin assessment completed.  No skin issues.  Educated the patient on how to reach the staff on the unit.  Lowered the bed and placed the call light within reach.  Will continue to monitor the patient and notify MD as needed

## 2017-10-11 NOTE — Progress Notes (Signed)
PROGRESS NOTE  Douglas Murphy [id number]:[id number] DOB: 11/13/1965 DOA: 10/10/2017 PCP: Rodrigo RanPerini, Mark, MD  HPI/Recap of past 24 hours: HPI from Toya SmothersBlack Karen, NP Douglas RileyWilliam Brian Murphy is a 52 y.o. 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 Initial evaluation reveals serum glucose 462 anion gap 16, normal bicarb and acute kidney injury. In the ED, pt was mildly tachycardic and was given insulin.[facility] are asked to admit.  Today, patient still complains of nausea, some vomiting.  Denies any abdominal pain, chest pain, shortness of breath, diarrhea, fever/chills.   Assessment/Plan: Principal Problem:   Acute kidney injury (HCC) Active Problems:   Diabetes mellitus (HCC)   Toe osteomyelitis (HCC)   Tachycardia   Intractable nausea and vomiting  Uncontrolled DM type 2 with hyperglycemia Improving, A1c 9.0 Serum glucose 462. Gap 16 bicarb 27 Non-compliant to diet, reports compliance to insulin Continue SSI, lantus increased to 15U, hypoglycemic protocol Continue IVF Held his 70/30 insulin, metformin Diabetes coordinator consult to assist with education/discharge plans  Acute kidney injury Resolved Likely related to poor oral intake + lisinopril + above Continous IVF Daily BMP  Intractable nausea Likely related to above, one episode of small NBNB vomitus IVF, Zofran Clear liquid, advance as tolerated  Tachycardia Improving, asymptomatic Likely related to above Review of chart shows HR high end of normal EKG: Sinus tachy Monitor closely, IVF    Code Status: Full  Family Communication: None at bedside   Disposition Plan: Home once stable, likely 10/12/2017   Consultants:  None  Procedures:  None  Antimicrobials:  None  DVT prophylaxis:  Lovenox   Objective: Vitals:   10/10/17  2009 10/11/17 0018 10/11/17 0436 10/11/17 1404  BP: (!) 154/88 127/75 128/83 (!) 148/83  Pulse: (!) 114 (!) 111 (!) 109 92  Resp: 18 18 18 18   Temp: 98.3 F (36.8 C) 98 F (36.7 C) 97.9 F (36.6 C) 98.5 F (36.9 C)  TempSrc: Oral     SpO2: 99% 97% 98% 99%    Intake/Output Summary (Last 24 hours) at 10/11/2017 1707 Last data filed at 10/11/2017 1400 Gross per 24 hour  Intake 2059.58 ml  Output 1600 ml  Net 459.58 ml   There were no vitals filed for this visit.  Exam:   General: Alert, awake, oriented, not in acute distress  Cardiovascular: S1, S2 present, no added heart sounds  Respiratory: Chest clear bilaterally  Abdomen: Soft, nontender, nondistended, bowel sounds present  Musculoskeletal: No pedal edema, amputation of left great toe  Skin: Normal  Psychiatry: Normal mood   Data Reviewed: CBC: Recent Labs  Lab 10/10/17 1016 10/11/17 0416  WBC 15.3* 13.1*  HGB 15.0 12.2*  HCT 43.0 36.0*  MCV 86.5 87.6  PLT 283 241   Basic Metabolic Panel: Recent Labs  Lab 10/10/17 1016 10/10/17 1630 10/11/17 0416  NA 138 143 139  K 4.4 4.5 3.6  CL 95* 106 106  CO2 27 27 24   GLUCOSE 462* 205* 181*  BUN 30* 22* 17  CREATININE 1.43* 0.97 0.85  CALCIUM 10.0 9.0 8.0*   GFR: CrCl cannot be calculated (Unknown ideal weight.). Liver Function Tests: Recent Labs  Lab 10/10/17 1016  AST 40  ALT 71*  ALKPHOS 85  BILITOT 0.9  PROT 8.1  ALBUMIN 4.5   Recent Labs  Lab 10/10/17 1016  LIPASE 20   No results for input(s): AMMONIA  in the last 168 hours. Coagulation Profile: No results for input(s): INR, PROTIME in the last 168 hours. Cardiac Enzymes: No results for input(s): CKTOTAL, CKMB, CKMBINDEX, TROPONINI in the last 168 hours. BNP (last 3 results) No results for input(s): PROBNP in the last 8760 hours. HbA1C: Recent Labs    10/10/17 1016  HGBA1C 9.0*   CBG: Recent Labs  Lab 10/10/17 1313 10/10/17 1639 10/10/17 2023 10/11/17 0755 10/11/17 1212    GLUCAP 290* 208* 220* 164* 224*   Lipid Profile: No results for input(s): CHOL, HDL, LDLCALC, TRIG, CHOLHDL, LDLDIRECT in the last 72 hours. Thyroid Function Tests: Recent Labs    10/10/17 1611  TSH 0.349*   Anemia Panel: No results for input(s): VITAMINB12, FOLATE, FERRITIN, TIBC, IRON, RETICCTPCT in the last 72 hours. Urine analysis:    Component Value Date/Time   COLORURINE STRAW (A) 10/10/2017 0831   APPEARANCEUR CLEAR 10/10/2017 0831   LABSPEC 1.027 10/10/2017 0831   PHURINE 6.0 10/10/2017 0831   GLUCOSEU >=500 (A) 10/10/2017 0831   HGBUR SMALL (A) 10/10/2017 0831   BILIRUBINUR NEGATIVE 10/10/2017 0831   KETONESUR 20 (A) 10/10/2017 0831   PROTEINUR NEGATIVE 10/10/2017 0831   UROBILINOGEN 0.2 09/03/2012 1643   NITRITE NEGATIVE 10/10/2017 0831   LEUKOCYTESUR NEGATIVE 10/10/2017 0831   Sepsis Labs: @LABRCNTIP (procalcitonin:4,lacticidven:4)  )No results found for this or any previous visit (from the past 240 hour(s)).    Studies: Dg Chest Port 1 View  Result Date: 10/10/2017 CLINICAL DATA:  52 year old male with history of cough and nausea for 1 day. EXAM: PORTABLE CHEST 1 VIEW COMPARISON:  Acute abdominal series 09/03/2012. FINDINGS: Elevation of the right hemidiaphragm. Lung volumes are low. No consolidative airspace disease. No pleural effusions. No pneumothorax. No pulmonary nodule or mass noted. Pulmonary vasculature and the cardiomediastinal silhouette are within normal limits. IMPRESSION: 1. Low lung volumes without radiographic evidence of acute cardiopulmonary disease. 2. Elevation of the right hemidiaphragm. This is of uncertain etiology and significance. Electronically Signed   By: Trudie Reed M.D.   On: 10/10/2017 19:16    Scheduled Meds: . [START ON 10/12/2017] enoxaparin (LOVENOX) injection  40 mg Subcutaneous Q24H  . [START ON 10/12/2017] insulin aspart  0-15 Units Subcutaneous TID WC  . insulin aspart  0-5 Units Subcutaneous QHS  . insulin glargine   15 Units Subcutaneous QHS    Continuous Infusions: . sodium chloride       LOS: 0 days     Briant Cedar, MD [facility]   If 7PM-7AM, please contact night-coverage [email or web address] Password [facility] 10/11/2017, 5:07 PM

## 2017-10-11 NOTE — Progress Notes (Addendum)
Inpatient Diabetes Program Recommendations  AACE/ADA: New Consensus Statement on Inpatient Glycemic Control (2015)  Target Ranges:  Prepandial:   less than 140 mg/dL      Peak postprandial:   less than 180 mg/dL (1-2 hours)      Critically ill patients:  140 - 180 mg/dL   Lab Results  Component Value Date   GLUCAP 164 (H) 10/11/2017   HGBA1C 9.0 (H) 10/10/2017   Review of Glycemic Control  Diabetes history: DM 2 Outpatient Diabetes medications: 70/30 30 units BID (basal equivalent of 42 units and short acting insulin equivalent of 18 units), Metformin 1,000 mg BID Current orders for Inpatient glycemic control: Lantus 10 units QHS, Novolog Sensitive Correction 0-9 units tid + Novolog HS scale  Inpatient Diabetes Program Recommendations:    Patient's fasting glucose 164 on Lantus 10 units. Counseled patient about not drinking regular sodas and meeting medication halfway with lifestyle modifications. Patient looks like he does not need as high of a 70/30 dose as prescribed.   Here are some dosing options: If the desire is to match inpatient dosing currently for home consider 70/30 8 units BID. If the patient is going to be eating differently at home and have different activity could consider 70/30 10 units BID. Also continue Metformin.  70/30 15 units BID is equivalent to 21 units of basal insulin and 9 units of short acting to cover meals  70/30 10 units BID is equivalent to 14 units of basal insulin and 6 units of short acting to cover meals.  70/30 8 units BID is equivalent to 11.2 units of basal insulin and 4.8 units of short acting to cover meals.  Thanks,  Christena DeemShannon Million Maharaj RN, MSN, [facility] Inpatient Diabetes Coordinator Team Pager [phone] (8a-5p)

## 2017-10-12 DIAGNOSIS — R739 Hyperglycemia, unspecified: Secondary | ICD-10-CM

## 2017-10-12 DIAGNOSIS — R112 Nausea with vomiting, unspecified: Secondary | ICD-10-CM

## 2017-10-12 DIAGNOSIS — E1110 Type 2 diabetes mellitus with ketoacidosis without coma: Principal | ICD-10-CM

## 2017-10-12 DIAGNOSIS — N179 Acute kidney failure, unspecified: Secondary | ICD-10-CM

## 2017-10-12 LAB — BASIC METABOLIC PANEL
ANION GAP: 10 (ref 5–15)
BUN: 12 mg/dL (ref 6–20)
CALCIUM: 8.2 mg/dL — AB (ref 8.9–10.3)
CO2: 20 mmol/L — ABNORMAL LOW (ref 22–32)
Chloride: 109 mmol/L (ref 101–111)
Creatinine, Ser: 0.76 mg/dL (ref 0.61–1.24)
GFR calc Af Amer: >60 mL/min (ref >60)
GLUCOSE: 128 mg/dL — AB (ref 65–99)
POTASSIUM: 3.6 mmol/L (ref 3.5–5.1)
SODIUM: 139 mmol/L (ref 135–145)

## 2017-10-12 LAB — CBC WITH DIFFERENTIAL/PLATELET
BASOS ABS: 0 10*3/uL (ref 0.0–0.1)
BASOS PCT: 0 %
EOS ABS: 0.1 10*3/uL (ref 0.0–0.7)
EOS PCT: 1 %
HCT: 35.8 % — ABNORMAL LOW (ref 39.0–52.0)
Hemoglobin: 12.1 g/dL — ABNORMAL LOW (ref 13.0–17.0)
Lymphocytes Relative: 35 %
Lymphs Abs: 3.6 10*3/uL (ref 0.7–4.0)
MCH: 29.7 pg (ref 26.0–34.0)
MCHC: 33.8 g/dL (ref 30.0–36.0)
MCV: 87.7 fL (ref 78.0–100.0)
MONO ABS: 0.7 10*3/uL (ref 0.1–1.0)
Monocytes Relative: 7 %
Neutro Abs: 5.8 10*3/uL (ref 1.7–7.7)
Neutrophils Relative %: 57 %
PLATELETS: 218 10*3/uL (ref 150–400)
RBC: 4.08 MIL/uL — AB (ref 4.22–5.81)
RDW: 13 % (ref 11.5–15.5)
WBC: 10.2 10*3/uL (ref 4.0–10.5)

## 2017-10-12 LAB — GLUCOSE, CAPILLARY
GLUCOSE-CAPILLARY: 144 mg/dL — AB (ref 65–99)
Glucose-Capillary: 134 mg/dL — ABNORMAL HIGH (ref 65–99)

## 2017-10-12 NOTE — Progress Notes (Signed)
Douglas RileyWilliam Brian Murphy to be D/C'd Home per MD order.  Discussed with the patient and all questions fully answered.  VSS, Skin clean, dry and intact without evidence of skin break down, no evidence of skin tears noted. IV catheter discontinued intact. Site without signs and symptoms of complications. Dressing and pressure applied.  An After Visit Summary was printed and given to the patient. Patient received prescription.  D/c education completed with patient/family including follow up instructions, medication list, d/c activities limitations if indicated, with other d/c instructions as indicated by MD - patient able to verbalize understanding, all questions fully answered.   Patient instructed to return to ED, call 911, or call MD for any changes in condition.   Patient escorted via WC, and D/C home via private auto.  [name] 10/12/2017 2:53 PM

## 2017-10-12 NOTE — Discharge Summary (Signed)
Physician Discharge Summary  Douglas Murphy ZOX:[id number] DOB: [birth year] DOA: 10/10/2017  PCP: [name], MD  Admit date: 10/10/2017 Discharge date: 10/12/2017  Admitted From: Home Disposition:  Home  Recommendations for Outpatient Follow-up:  1. Follow up with PCP in 1- week 2. Patient was advised to follow a diabetic diet and compliance with insulin therapy  Home Health: no  Equipment/Devices: no   Discharge Condition: Stable CODE STATUS: full Diet recommendation:  Heart healthy and diabetic prudent.   Brief/Interim Summary: 51 year old male who presented with abdominal pain, persistent nausea and vomiting. He does have significant past medical history of type 2 diabetes mellitus, hypertension, history of osteomyelitis status post amputation of his left great toe. Apparently patient developed nausea and vomiting, unable to eat anything by mouth, he skipped his p.m. dose of insulin. On his initial physical examination blood pressure 117/81, heart rate 119, respiratory rate 16, oxygen saturation 97%. Moist mucous membranes, lungs clear to auscultation bilaterally, heart S1-S2 present rhythmic, abdomen soft nontender, no lower extremity edema. sodium 138, potassium 4.4, chloride 95, bicarbonate 27, glucose 462, BUN 30, creatinine 1.4, anion gap 16, white count 15.3, hemoglobin 15.0, bun 43.0 platelets 283. Urine drug screen negative for infection, glucose greater than 500, negative proteins. Chest x-ray hypoinflated, no infiltrates, EKG normal sinus rhythm.   Patient was admitted to the hospital with the working diagnosis of diabetes ketoacidosis.  1. Diabetes ketoacidosis. Patient received long-acting insulin subcutaneously, with improvement of his symptoms, anion gap closed. He received insulin glargine 15 units along with insulin sliding-scale. His diet was advanced with good toleration, no further nausea or vomiting.  2. Type 2 diabetes mellitus. Noncompliance with medications or  diet, patient has been reinforced about medical compliance, he will continue his regular regimen of Novolin 70/30 30 units twice daily, along with thousand milligrams of metformin twice daily. Continue Glucose monitoring, continued to use insulin in case of hyperglycemia at all times, especially if patient feeling ill.   3. Acute kidney injury. Likely prerenal, related to diabetes ketoacidosis and hyperglycemia, he received IV fluids, with improvement of his kidney function, discharge creatinine 0.76.   4. Hypertension. Blood pressure remained well-controlled, patient will resume lisinopril 5 milligrams daily.     Discharge Diagnoses:  Principal Problem:   Acute kidney injury [facility]) Active Problems:   Diabetes mellitus (HCC)   Toe osteomyelitis (HCC)   Tachycardia   Intractable nausea and vomiting    Discharge Instructions   Allergies as of 10/12/2017      Reactions   Other Anaphylaxis   [place] nuts      Medication List    TAKE these medications   aspirin-sod bicarb-citric acid 325 MG Tbef tablet Commonly known as:  ALKA-SELTZER Take 650 mg by mouth daily as needed (indigestion).   bismuth subsalicylate 262 MG/15ML suspension Commonly known as:  PEPTO BISMOL Take 30 mLs by mouth every 6 (six) hours as needed for indigestion.   calcium carbonate 500 MG chewable tablet Commonly known as:  TUMS - dosed in mg elemental calcium Chew 3 tablets by mouth daily as needed for indigestion or heartburn.   ibuprofen 200 MG tablet Commonly known as:  ADVIL,MOTRIN Take 400 mg by mouth 2 (two) times daily as needed for moderate pain.   insulin NPH-regular Human (70-30) 100 UNIT/ML injection Commonly known as:  NOVOLIN 70/30 Inject 30 Units into the skin 2 (two) times daily.   lisinopril 5 MG tablet Commonly known as:  PRINIVIL,ZESTRIL Take 5 mg by mouth daily.  metFORMIN 1000 MG tablet Commonly known as:  GLUCOPHAGE Take 1,000 mg by mouth 2 (two) times daily.        Allergies  Allergen Reactions  . Other Anaphylaxis    [place] nuts     Consultations:     Procedures/Studies: Dg Chest Port 1 View  Result Date: 10/10/2017 CLINICAL DATA:  52 year old male with history of cough and nausea for 1 day. EXAM: PORTABLE CHEST 1 VIEW COMPARISON:  Acute abdominal series 09/03/2012. FINDINGS: Elevation of the right hemidiaphragm. Lung volumes are low. No consolidative airspace disease. No pleural effusions. No pneumothorax. No pulmonary nodule or mass noted. Pulmonary vasculature and the cardiomediastinal silhouette are within normal limits. IMPRESSION: 1. Low lung volumes without radiographic evidence of acute cardiopulmonary disease. 2. Elevation of the right hemidiaphragm. This is of uncertain etiology and significance. Electronically Signed   By: Trudie Reed M.D.   On: 10/10/2017 19:16       Subjective: Patient feeling better, no further nausea or vomiting, no chest pain.   Discharge Exam: Vitals:   10/11/17 2216 10/12/17 0449  BP: 127/83 129/82  Pulse: 99 88  Resp: 18 17  Temp: 98.3 F (36.8 C) 99.1 F (37.3 C)  SpO2: 98% 97%   Vitals:   10/11/17 0436 10/11/17 1404 10/11/17 2216 10/12/17 0449  BP: 128/83 (!) 148/83 127/83 129/82  Pulse: (!) 109 92 99 88  Resp: 18 18 18 17   Temp: 97.9 F (36.6 C) 98.5 F (36.9 C) 98.3 F (36.8 C) 99.1 F (37.3 C)  TempSrc:   Oral Oral  SpO2: 98% 99% 98% 97%  Weight:   92.6 kg (204 lb 2.3 oz)   Height:   6' (1.829 m)     General: Not in pain or dyspnea Neurology: Awake and alert, non focal  E ENT: no pallor, no icterus, oral mucosa moist Cardiovascular: No JVD. S1-S2 present, rhythmic, no gallops, rubs, or murmurs. No lower extremity edema. Pulmonary: vesicular breath sounds bilaterally, adequate air movement, no wheezing, rhonchi or rales. Gastrointestinal. Abdomen flat, no organomegaly, non tender, no rebound or guarding Skin. No rashes Musculoskeletal: no joint deformities   The  results of significant diagnostics from this hospitalization (including imaging, microbiology, ancillary and laboratory) are listed below for reference.     Microbiology: No results found for this or any previous visit (from the past 240 hour(s)).   Labs: BNP (last 3 results) No results for input(s): BNP in the last 8760 hours. Basic Metabolic Panel: Recent Labs  Lab 10/10/17 1016 10/10/17 1630 10/11/17 0416 10/12/17 0411  NA 138 143 139 139  K 4.4 4.5 3.6 3.6  CL 95* 106 106 109  CO2 27 27 24  20*  GLUCOSE 462* 205* 181* 128*  BUN 30* 22* 17 12  CREATININE 1.43* 0.97 0.85 0.76  CALCIUM 10.0 9.0 8.0* 8.2*   Liver Function Tests: Recent Labs  Lab 10/10/17 1016  AST 40  ALT 71*  ALKPHOS 85  BILITOT 0.9  PROT 8.1  ALBUMIN 4.5   Recent Labs  Lab 10/10/17 1016  LIPASE 20   No results for input(s): AMMONIA in the last 168 hours. CBC: Recent Labs  Lab 10/10/17 1016 10/11/17 0416 10/12/17 0411  WBC 15.3* 13.1* 10.2  NEUTROABS  --   --  5.8  HGB 15.0 12.2* 12.1*  HCT 43.0 36.0* 35.8*  MCV 86.5 87.6 87.7  PLT 283 241 218   Cardiac Enzymes: No results for input(s): CKTOTAL, CKMB, CKMBINDEX, TROPONINI in the last 168 hours. BNP:  Invalid input(s): POCBNP CBG: Recent Labs  Lab 10/11/17 1212 10/11/17 1741 10/11/17 2219 10/12/17 0818 10/12/17 1220  GLUCAP 224* 190* 179* 144* 134*   D-Dimer No results for input(s): DDIMER in the last 72 hours. Hgb A1c Recent Labs    10/10/17 1016  HGBA1C 9.0*   Lipid Profile No results for input(s): CHOL, HDL, LDLCALC, TRIG, CHOLHDL, LDLDIRECT in the last 72 hours. Thyroid function studies Recent Labs    10/10/17 1611  TSH 0.349*   Anemia work up No results for input(s): VITAMINB12, FOLATE, FERRITIN, TIBC, IRON, RETICCTPCT in the last 72 hours. Urinalysis    Component Value Date/Time   COLORURINE STRAW (A) 10/10/2017 0831   APPEARANCEUR CLEAR 10/10/2017 0831   LABSPEC 1.027 10/10/2017 0831   PHURINE 6.0  10/10/2017 0831   GLUCOSEU >=500 (A) 10/10/2017 0831   HGBUR SMALL (A) 10/10/2017 0831   BILIRUBINUR NEGATIVE 10/10/2017 0831   KETONESUR 20 (A) 10/10/2017 0831   PROTEINUR NEGATIVE 10/10/2017 0831   UROBILINOGEN 0.2 09/03/2012 1643   NITRITE NEGATIVE 10/10/2017 0831   LEUKOCYTESUR NEGATIVE 10/10/2017 0831   Sepsis Labs Invalid input(s): PROCALCITONIN,  WBC,  LACTICIDVEN Microbiology No results found for this or any previous visit (from the past 240 hour(s)).   Time coordinating discharge: 45 minutes  SIGNED:   Coralie KeensMauricio Daniel Nicodemus Denk, MD  [facility] 10/12/2017, 12:29 PM Pager [phone]  If 7PM-7AM, please contact night-coverage [email or web address] Password [email or web address]

## 2018-01-30 ENCOUNTER — Emergency Department (HOSPITAL_COMMUNITY)
Admission: EM | Admit: 2018-01-30 | Discharge: 2018-01-30 | Disposition: A | Discharge status: Home / Self Care | Payer: Self-pay | Attending: Emergency Medicine | Admitting: Emergency Medicine

## 2018-01-30 ENCOUNTER — Emergency Department (HOSPITAL_COMMUNITY): Payer: Self-pay

## 2018-01-30 ENCOUNTER — Other Ambulatory Visit: Payer: Self-pay

## 2018-01-30 ENCOUNTER — Encounter (HOSPITAL_COMMUNITY): Payer: Self-pay | Admitting: Emergency Medicine

## 2018-01-30 DIAGNOSIS — E119 Type 2 diabetes mellitus without complications: Secondary | ICD-10-CM | POA: Insufficient documentation

## 2018-01-30 DIAGNOSIS — R112 Nausea with vomiting, unspecified: Secondary | ICD-10-CM | POA: Insufficient documentation

## 2018-01-30 DIAGNOSIS — Z79899 Other long term (current) drug therapy: Secondary | ICD-10-CM | POA: Insufficient documentation

## 2018-01-30 DIAGNOSIS — Z794 Long term (current) use of insulin: Secondary | ICD-10-CM | POA: Insufficient documentation

## 2018-01-30 DIAGNOSIS — K760 Fatty (change of) liver, not elsewhere classified: Secondary | ICD-10-CM

## 2018-01-30 HISTORY — DX: Fatty (change of) liver, not elsewhere classified: K76.0

## 2018-01-30 LAB — CBC
HCT: 44.3 % (ref 39.0–52.0)
Hemoglobin: 14.8 g/dL (ref 13.0–17.0)
MCH: 28.8 pg (ref 26.0–34.0)
MCHC: 33.4 g/dL (ref 30.0–36.0)
MCV: 86.4 fL (ref 78.0–100.0)
Platelets: 296 10*3/uL (ref 150–400)
RBC: 5.13 MIL/uL (ref 4.22–5.81)
RDW: 12.9 % (ref 11.5–15.5)
WBC: 12.2 10*3/uL — ABNORMAL HIGH (ref 4.0–10.5)

## 2018-01-30 LAB — I-STAT VENOUS BLOOD GAS, ED
Acid-Base Excess: 5 mmol/L — ABNORMAL HIGH (ref 0.0–2.0)
BICARBONATE: 30.1 mmol/L — AB (ref 20.0–28.0)
O2 Saturation: 30 %
PH VEN: 7.427 (ref 7.250–7.430)
PO2 VEN: 19 mmHg — AB (ref 32.0–45.0)
TCO2: 32 mmol/L (ref 22–32)
pCO2, Ven: 45.7 mmHg (ref 44.0–60.0)

## 2018-01-30 LAB — COMPREHENSIVE METABOLIC PANEL
ALBUMIN: 4.5 g/dL (ref 3.5–5.0)
ALK PHOS: 64 U/L (ref 38–126)
ALT: 43 U/L (ref 17–63)
AST: 30 U/L (ref 15–41)
Anion gap: 18 — ABNORMAL HIGH (ref 5–15)
BILIRUBIN TOTAL: 0.7 mg/dL (ref 0.3–1.2)
BUN: 21 mg/dL — AB (ref 6–20)
CALCIUM: 10 mg/dL (ref 8.9–10.3)
CO2: 25 mmol/L (ref 22–32)
Chloride: 95 mmol/L — ABNORMAL LOW (ref 101–111)
Creatinine, Ser: 1.33 mg/dL — ABNORMAL HIGH (ref 0.61–1.24)
GFR calc Af Amer: >60 mL/min (ref >60)
GFR calc non Af Amer: >60 mL/min (ref >60)
GLUCOSE: 198 mg/dL — AB (ref 65–99)
Potassium: 3.5 mmol/L (ref 3.5–5.1)
Sodium: 138 mmol/L (ref 135–145)
Total Protein: 7.5 g/dL (ref 6.5–8.1)

## 2018-01-30 LAB — URINALYSIS, ROUTINE W REFLEX MICROSCOPIC
BILIRUBIN URINE: NEGATIVE
Glucose, UA: 50 mg/dL — AB
HGB URINE DIPSTICK: NEGATIVE
Ketones, ur: 5 mg/dL — AB
Leukocytes, UA: NEGATIVE
NITRITE: NEGATIVE
Protein, ur: 30 mg/dL — AB
SPECIFIC GRAVITY, URINE: 1.02 (ref 1.005–1.030)
pH: 8 (ref 5.0–8.0)

## 2018-01-30 LAB — TROPONIN I: Troponin I: <0.03 ng/mL (ref <0.03)

## 2018-01-30 LAB — LIPASE, BLOOD: Lipase: 33 U/L (ref 11–51)

## 2018-01-30 LAB — CBG MONITORING, ED: Glucose-Capillary: 175 mg/dL — ABNORMAL HIGH (ref 65–99)

## 2018-01-30 MED ORDER — SODIUM CHLORIDE 0.9 % IV BOLUS
1000.0000 mL | Freq: Once | INTRAVENOUS | Status: AC
  Administered 2018-01-30: 1000 mL via INTRAVENOUS

## 2018-01-30 MED ORDER — ONDANSETRON HCL 4 MG PO TABS: 4.0000 mg | ORAL_TABLET | Freq: Four times a day (QID) | ORAL | 0 refills | Status: DC

## 2018-01-30 MED ORDER — HALOPERIDOL LACTATE 5 MG/ML IJ SOLN
1.0000 mg | Freq: Once | INTRAMUSCULAR | Status: AC
Start: 2018-01-30 — End: 2018-01-30
  Administered 2018-01-30: 1 mg via INTRAVENOUS
  Filled 2018-01-30: qty 1

## 2018-01-30 MED ORDER — METOCLOPRAMIDE HCL 5 MG/ML IJ SOLN
10.0000 mg | Freq: Once | INTRAMUSCULAR | Status: AC
  Administered 2018-01-30: 10 mg via INTRAVENOUS
  Filled 2018-01-30: qty 2

## 2018-01-30 MED ORDER — ONDANSETRON HCL 4 MG/2ML IJ SOLN: 4.0000 mg | Freq: Once | INTRAMUSCULAR | Status: DC

## 2018-01-30 MED ORDER — SODIUM CHLORIDE 0.9 % IV BOLUS: 1000.0000 mL | Freq: Once | INTRAVENOUS | Status: DC

## 2018-01-30 MED ORDER — METOCLOPRAMIDE HCL 10 MG PO TABS: 10.0000 mg | ORAL_TABLET | Freq: Four times a day (QID) | ORAL | 0 refills | Status: DC | PRN

## 2018-01-30 MED ORDER — IOHEXOL 300 MG/ML  SOLN
100.0000 mL | Freq: Once | INTRAMUSCULAR | Status: AC | PRN
  Administered 2018-01-30: 100 mL via INTRAVENOUS

## 2018-01-30 MED ORDER — ONDANSETRON HCL 4 MG/2ML IJ SOLN
4.0000 mg | Freq: Once | INTRAMUSCULAR | Status: AC
  Administered 2018-01-30: 4 mg via INTRAVENOUS
  Filled 2018-01-30: qty 2

## 2018-01-30 NOTE — ED Notes (Signed)
Pt vomiting after medications; requesting something else for nausea

## 2018-01-30 NOTE — ED Provider Notes (Signed)
[facility] EMERGENCY DEPARTMENT Provider Note   CSN: [id number] Arrival date & time: 01/30/18  0053     History   Chief Complaint Chief Complaint  Patient presents with  . Emesis    HPI Douglas Murphy is a 52 y.o. male.  Patient presents to the emergency department for evaluation of nausea and vomiting.  Patient reports his symptoms began earlier today and have been persistent through the day.  He has not been able to eat or drink anything.  He has not had any chest pain, shortness of breath, abdominal pain or diarrhea.  He apparently passed out in the bathroom while in the waiting room, was brought back to the ER immediately.  He reports that he feels very weak, does not remember what happened when he passed out.  Still no chest pain or difficulty breathing.  No injury.     Past Medical History:  Diagnosis Date  . Acute kidney injury (HCC)   . Diabetes mellitus without complication (HCC)    Type II  . Osteomyelitis (HCC)   . Tachycardia     Patient Active Problem List   Diagnosis Date Noted  . Acute kidney injury (HCC) 10/10/2017  . Tachycardia 10/10/2017  . Intractable nausea and vomiting 10/10/2017  . Toe osteomyelitis (HCC) 03/21/2017  . Osteomyelitis of left foot (HCC) 09/24/2016  . Diabetes mellitus (HCC)     Past Surgical History:  Procedure Laterality Date  . AMPUTATION TOE Left 09/25/2016   Procedure: AMPUTATION GREAT TOE;  Surgeon: Kathryne Hitch, MD;  Location: [facility] OR;  Service: Orthopedics;  Laterality: Left;  . I&D EXTREMITY Left 01/11/2017   Procedure: IRRIGATION AND DEBRIDEMENT LEFT FOOT FIRST RAY WOUND;  Surgeon: Kathryne Hitch, MD;  Location: [facility] OR;  Service: Orthopedics;  Laterality: Left;  . KNEE ARTHROSCOPY Right    x2  . TONSILLECTOMY    . TYMPANOSTOMY TUBE PLACEMENT          Home Medications    Prior to Admission medications   Medication Sig Start Date End Date Taking? Authorizing Provider    insulin NPH-regular Human (NOVOLIN 70/30) (70-30) 100 UNIT/ML injection Inject 30 Units into the skin 2 (two) times daily.   Yes [provider]  lisinopril (PRINIVIL,ZESTRIL) 5 MG tablet Take 5 mg by mouth daily.   Yes [provider]  metFORMIN (GLUCOPHAGE) 1000 MG tablet Take 1,000 mg by mouth 2 (two) times daily.   Yes [provider]    Family History Family History  Problem Relation Age of Onset  . Hypertension Mother     Social History Social History   Tobacco Use  . Smoking status: Never Smoker  . Smokeless tobacco: Never Used  Substance Use Topics  . Alcohol use: No  . Drug use: No     Allergies   Other   Review of Systems Review of Systems  Gastrointestinal: Positive for nausea and vomiting.  Neurological: Positive for dizziness and syncope.  All other systems reviewed and are negative.    Physical Exam Updated Vital Signs BP (!) 155/104   Pulse (!) 113   Resp (!) 23   Ht 6' (1.829 m)   Wt 95.3 kg (210 lb)   SpO2 96%   BMI 28.48 kg/m   Physical Exam  Constitutional: He is oriented to person, place, and time. He appears well-developed and well-nourished. No distress.  HENT:  Head: Normocephalic and atraumatic.  Right Ear: Hearing normal.  Left Ear: Hearing normal.  Nose: Nose normal.  Mouth/Throat: Oropharynx is clear and moist and mucous membranes are normal.  Eyes: Pupils are equal, round, and reactive to light. Conjunctivae and EOM are normal.  Neck: Normal range of motion. Neck supple.  Cardiovascular: Regular rhythm, S1 normal and S2 normal. Exam reveals no gallop and no friction rub.  No murmur heard. Pulmonary/Chest: Effort normal and breath sounds normal. No respiratory distress. He exhibits no tenderness.  Abdominal: Soft. Normal appearance and bowel sounds are normal. There is no hepatosplenomegaly. There is no tenderness. There is no rebound, no guarding, no tenderness at McBurney's point and negative Murphy's  sign. No hernia.  Musculoskeletal: Normal range of motion.  Neurological: He is alert and oriented to person, place, and time. He has normal strength. No cranial nerve deficit or sensory deficit. Coordination normal. GCS eye subscore is 4. GCS verbal subscore is 5. GCS motor subscore is 6.  Skin: Skin is warm, dry and intact. No rash noted. No cyanosis.  Psychiatric: He has a normal mood and affect. His speech is normal and behavior is normal. Thought content normal.  Nursing note and vitals reviewed.    ED Treatments / Results  Labs (all labs ordered are listed, but only abnormal results are displayed) Labs Reviewed  COMPREHENSIVE METABOLIC PANEL - Abnormal; Notable for the following components:      Result Value   Chloride 95 (*)    Glucose, Bld 198 (*)    BUN 21 (*)    Creatinine, Ser 1.33 (*)    Anion gap 18 (*)    All other components within normal limits  CBC - Abnormal; Notable for the following components:   WBC 12.2 (*)    All other components within normal limits  URINALYSIS, ROUTINE W REFLEX MICROSCOPIC - Abnormal; Notable for the following components:   APPearance CLOUDY (*)    Glucose, UA 50 (*)    Ketones, ur 5 (*)    Protein, ur 30 (*)    Bacteria, UA RARE (*)    All other components within normal limits  CBG MONITORING, ED - Abnormal; Notable for the following components:   Glucose-Capillary 175 (*)    All other components within normal limits  I-STAT VENOUS BLOOD GAS, ED - Abnormal; Notable for the following components:   pO2, Ven 19.0 (*)    Bicarbonate 30.1 (*)    Acid-Base Excess 5.0 (*)    All other components within normal limits  LIPASE, BLOOD  TROPONIN I  BLOOD GAS, VENOUS    EKG EKG Interpretation  Date/Time:  Monday Jan 30 2018 01:27:17 EDT Ventricular Rate:  97 PR Interval:  146 QRS Duration: 96 QT Interval:  368 QTC Calculation: 467 R Axis:   80 Text Interpretation:  Normal sinus rhythm Normal ECG Confirmed by Gilda Crease  [phone]) on 01/30/2018 1:36:13 AM   Radiology Ct Abdomen Pelvis W Contrast  Result Date: 01/30/2018 CLINICAL DATA:  Nausea and vomiting. EXAM: CT ABDOMEN AND PELVIS WITH CONTRAST TECHNIQUE: Multidetector CT imaging of the abdomen and pelvis was performed using the standard protocol following bolus administration of intravenous contrast. CONTRAST:  OMNIPAQUE IOHEXOL 300 MG/ML  SOLN COMPARISON:  None. FINDINGS: Lower chest: Mild atelectasis at the right lung base. No pleural fluid. Hepatobiliary: Diffusely decreased hepatic density consistent with steatosis. No focal hepatic lesion. Gallbladder physiologically distended, no calcified stone. No biliary dilatation. Pancreas: No ductal dilatation or inflammation. Spleen: Normal in size without focal abnormality. Small splenule anteriorly. Adrenals/Urinary Tract: Normal adrenal glands. No  hydronephrosis or perinephric edema. Homogeneous renal enhancement with symmetric excretion on delayed phase imaging. Urinary bladder is physiologically distended without wall thickening. Stomach/Bowel: Fluid-filled physiologically distended stomach without gastric wall thickening. Small bowel is nondistended without wall thickening, inflammatory change or obstruction. Normal appendix. Small to moderate colonic stool burden without colonic inflammation or wall thickening. No significant diverticular disease. Vascular/Lymphatic: No significant vascular findings are present. No enlarged abdominal or pelvic lymph nodes. Reproductive: Prostate is unremarkable. Other: No free air, free fluid, or intra-abdominal fluid collection. Musculoskeletal: There are no acute or suspicious osseous abnormalities. IMPRESSION: 1. No acute findings in the abdomen or pelvis. 2. Incidental hepatic steatosis. Electronically Signed   By: Rubye Oaks M.D.   On: 01/30/2018 04:29    Procedures Procedures (including critical care time)  Medications Ordered in ED Medications  haloperidol  lactate (HALDOL) injection 1 mg (has no administration in time range)  metoCLOPramide (REGLAN) injection 10 mg (10 mg Intravenous Given 01/30/18 0154)  ondansetron (ZOFRAN) injection 4 mg (4 mg Intravenous Given 01/30/18 0154)  sodium chloride 0.9 % bolus 1,000 mL (0 mLs Intravenous Stopped 01/30/18 0423)    Followed by  sodium chloride 0.9 % bolus 1,000 mL (0 mLs Intravenous Stopped 01/30/18 0423)  iohexol (OMNIPAQUE) 300 MG/ML solution 100 mL (100 mLs Intravenous Contrast Given 01/30/18 0352)     Initial Impression / Assessment and Plan / ED Course  I have reviewed the triage vital signs and the nursing notes.  Pertinent labs & imaging results that were available during my care of the patient were reviewed by me and considered in my medical decision making (see chart for details).     Patient presents to the emergency department for evaluation of nausea and vomiting.  He is not experiencing any significant abdominal pain.  He is diabetic.  Blood sugar is slightly elevated at 198, no sign of DKA.  BUN and creatinine are both slightly elevated.  Electrolytes are unremarkable.  Remainder of blood work is unremarkable.  Patient has had previous visits for similar.  In January he was admitted for acute kidney injury and DKA secondary to nausea and vomiting.  I suspect he might have some mild gastroparesis.  He was administered 2 L of normal saline, Zofran, Reglan.  He continued to complain of nausea but he has not had any vomiting here in the ER.  A CT scan was performed, did not show any acute abnormality.  He does not appear to be dehydrated at this time, is not in any pain.  Patient can be discharged, will continue antiemetics and have follow-up with PCP.  Final Clinical Impressions(s) / ED Diagnoses   Final diagnoses:  Non-intractable vomiting with nausea, unspecified vomiting type    ED Discharge Orders    None       Gilda Crease, MD 01/30/18 [phone]

## 2018-01-30 NOTE — ED Notes (Signed)
Pt returned from CT °

## 2018-01-30 NOTE — ED Triage Notes (Signed)
Pt arrived POV with n/v, pt checked in then found diaphoretic in WR bathroom floor, diaphoretic, with small amount emesis on floor. Pt c/o severe weakness, nausea. States his sugar has been running high.

## 2018-07-14 DIAGNOSIS — T300 Burn of unspecified body region, unspecified degree: Secondary | ICD-10-CM

## 2018-07-14 HISTORY — DX: Burn of unspecified body region, unspecified degree: T30.0

## 2018-07-31 ENCOUNTER — Emergency Department (HOSPITAL_COMMUNITY): Payer: Self-pay

## 2018-07-31 ENCOUNTER — Emergency Department (HOSPITAL_COMMUNITY)
Admission: EM | Admit: 2018-07-31 | Discharge: 2018-07-31 | Disposition: A | Discharge status: Home / Self Care | Payer: Self-pay | Attending: Emergency Medicine | Admitting: Emergency Medicine

## 2018-07-31 ENCOUNTER — Other Ambulatory Visit: Payer: Self-pay

## 2018-07-31 ENCOUNTER — Encounter (HOSPITAL_COMMUNITY): Payer: Self-pay | Admitting: Emergency Medicine

## 2018-07-31 DIAGNOSIS — X158XXA Contact with other hot household appliances, initial encounter: Secondary | ICD-10-CM | POA: Insufficient documentation

## 2018-07-31 DIAGNOSIS — Y939 Activity, unspecified: Secondary | ICD-10-CM | POA: Insufficient documentation

## 2018-07-31 DIAGNOSIS — Z79899 Other long term (current) drug therapy: Secondary | ICD-10-CM | POA: Insufficient documentation

## 2018-07-31 DIAGNOSIS — T25022A Burn of unspecified degree of left foot, initial encounter: Secondary | ICD-10-CM | POA: Insufficient documentation

## 2018-07-31 DIAGNOSIS — Y999 Unspecified external cause status: Secondary | ICD-10-CM | POA: Insufficient documentation

## 2018-07-31 DIAGNOSIS — T25132A Burn of first degree of left toe(s) (nail), initial encounter: Secondary | ICD-10-CM

## 2018-07-31 DIAGNOSIS — E119 Type 2 diabetes mellitus without complications: Secondary | ICD-10-CM | POA: Insufficient documentation

## 2018-07-31 DIAGNOSIS — Y92009 Unspecified place in unspecified non-institutional (private) residence as the place of occurrence of the external cause: Secondary | ICD-10-CM | POA: Insufficient documentation

## 2018-07-31 LAB — CBC WITH DIFFERENTIAL/PLATELET
Abs Immature Granulocytes: 0.07 10*3/uL (ref 0.00–0.07)
BASOS PCT: 0 %
Basophils Absolute: 0 10*3/uL (ref 0.0–0.1)
EOS ABS: 1.3 10*3/uL — AB (ref 0.0–0.5)
EOS PCT: 9 %
HEMATOCRIT: 41.8 % (ref 39.0–52.0)
Hemoglobin: 12.8 g/dL — ABNORMAL LOW (ref 13.0–17.0)
Immature Granulocytes: 1 %
Lymphocytes Relative: 20 %
Lymphs Abs: 3 10*3/uL (ref 0.7–4.0)
MCH: 27.9 pg (ref 26.0–34.0)
MCHC: 30.6 g/dL (ref 30.0–36.0)
MCV: 91.1 fL (ref 80.0–100.0)
Monocytes Absolute: 1.1 10*3/uL — ABNORMAL HIGH (ref 0.1–1.0)
Monocytes Relative: 7 %
Neutro Abs: 9.2 10*3/uL — ABNORMAL HIGH (ref 1.7–7.7)
Neutrophils Relative %: 63 %
PLATELETS: 282 10*3/uL (ref 150–400)
RBC: 4.59 MIL/uL (ref 4.22–5.81)
RDW: 13.4 % (ref 11.5–15.5)
WBC: 14.7 10*3/uL — ABNORMAL HIGH (ref 4.0–10.5)
nRBC: 0 % (ref 0.0–0.2)

## 2018-07-31 LAB — COMPREHENSIVE METABOLIC PANEL
ALT: 23 U/L (ref 0–44)
ANION GAP: 9 (ref 5–15)
AST: 21 U/L (ref 15–41)
Albumin: 3.7 g/dL (ref 3.5–5.0)
Alkaline Phosphatase: 77 U/L (ref 38–126)
BILIRUBIN TOTAL: 0.3 mg/dL (ref 0.3–1.2)
BUN: 15 mg/dL (ref 6–20)
CHLORIDE: 99 mmol/L (ref 98–111)
CO2: 28 mmol/L (ref 22–32)
Calcium: 9.3 mg/dL (ref 8.9–10.3)
Creatinine, Ser: 1.07 mg/dL (ref 0.61–1.24)
Glucose, Bld: 276 mg/dL — ABNORMAL HIGH (ref 70–99)
Potassium: 4.7 mmol/L (ref 3.5–5.1)
Sodium: 136 mmol/L (ref 135–145)
TOTAL PROTEIN: 6.8 g/dL (ref 6.5–8.1)

## 2018-07-31 LAB — URINALYSIS, ROUTINE W REFLEX MICROSCOPIC
Bacteria, UA: NONE SEEN
Bilirubin Urine: NEGATIVE
HGB URINE DIPSTICK: NEGATIVE
Ketones, ur: 5 mg/dL — AB
LEUKOCYTES UA: NEGATIVE
NITRITE: NEGATIVE
PH: 6 (ref 5.0–8.0)
Protein, ur: NEGATIVE mg/dL
Specific Gravity, Urine: 1.029 (ref 1.005–1.030)

## 2018-07-31 LAB — CBG MONITORING, ED: GLUCOSE-CAPILLARY: 264 mg/dL — AB (ref 70–99)

## 2018-07-31 LAB — I-STAT CG4 LACTIC ACID, ED: LACTIC ACID, VENOUS: 1.6 mmol/L (ref 0.5–1.9)

## 2018-07-31 MED ORDER — DOXYCYCLINE HYCLATE 100 MG PO TABS
100.0000 mg | ORAL_TABLET | Freq: Once | ORAL | Status: AC
  Administered 2018-07-31: 100 mg via ORAL
  Filled 2018-07-31: qty 1

## 2018-07-31 MED ORDER — BACITRACIN ZINC 500 UNIT/GM EX OINT
TOPICAL_OINTMENT | Freq: Two times a day (BID) | CUTANEOUS | Status: DC
  Administered 2018-07-31: 1 via TOPICAL
  Filled 2018-07-31: qty 0.9

## 2018-07-31 MED ORDER — DOXYCYCLINE HYCLATE 100 MG PO CAPS: 100.0000 mg | ORAL_CAPSULE | Freq: Two times a day (BID) | ORAL | 0 refills | Status: DC

## 2018-07-31 MED ORDER — BACITRACIN ZINC 500 UNIT/GM EX OINT: 1.0000 "application " | TOPICAL_OINTMENT | Freq: Two times a day (BID) | CUTANEOUS | 0 refills | Status: DC

## 2018-07-31 NOTE — ED Notes (Signed)
Sugar was 264
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
## 2018-07-31 NOTE — ED Triage Notes (Signed)
Per pt he has diabetes and has several toes that have blisters that are open at this time. Pt does not have any red streaking at this time.

## 2018-07-31 NOTE — ED Notes (Signed)
Pt stable, ambulatory, states understanding of discharge instructions 
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
## 2018-08-02 LAB — URINE CULTURE: Culture: NO GROWTH

## 2018-08-04 ENCOUNTER — Encounter: Payer: Self-pay | Admitting: Plastic Surgery

## 2018-08-04 ENCOUNTER — Encounter (HOSPITAL_BASED_OUTPATIENT_CLINIC_OR_DEPARTMENT_OTHER): Payer: Self-pay

## 2018-08-04 ENCOUNTER — Ambulatory Visit (INDEPENDENT_AMBULATORY_CARE_PROVIDER_SITE_OTHER): Payer: Self-pay | Admitting: Plastic Surgery

## 2018-08-04 VITALS — BP 147/86 | HR 92 | Ht 72.0 in | Wt 205.0 lb

## 2018-08-04 DIAGNOSIS — T25322A Burn of third degree of left foot, initial encounter: Secondary | ICD-10-CM

## 2018-08-04 DIAGNOSIS — L97509 Non-pressure chronic ulcer of other part of unspecified foot with unspecified severity: Secondary | ICD-10-CM

## 2018-08-04 DIAGNOSIS — E11621 Type 2 diabetes mellitus with foot ulcer: Secondary | ICD-10-CM
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
## 2018-08-04 NOTE — Progress Notes (Signed)
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  I got as far as his medical history and then he had to get off the phone.  He said it was ok for me to leave his instructions for surgery on his voicemail.  I left instructions on the voicemail and played them back to make sure it recorded without technical difficult, playback was normal.   Spoke with: "Douglas Murphy" NPO:  After Midnight, no gum, candy, or mints , No smoking if he smoke Arrival time: 0800AM Labs: Istat 8 (EKG chart/epic) AM medications: None Pre op orders: No Ride home: Kennon RoundsSally (mom) [phone]

## 2018-08-07 ENCOUNTER — Ambulatory Visit (HOSPITAL_BASED_OUTPATIENT_CLINIC_OR_DEPARTMENT_OTHER): Payer: Self-pay | Admitting: Anesthesiology

## 2018-08-07 ENCOUNTER — Ambulatory Visit (HOSPITAL_BASED_OUTPATIENT_CLINIC_OR_DEPARTMENT_OTHER)
Admission: RE | Admit: 2018-08-07 | Discharge: 2018-08-07 | Disposition: A | Discharge status: Home / Self Care | Payer: Self-pay | Source: Ambulatory Visit | Attending: Plastic Surgery | Admitting: Plastic Surgery

## 2018-08-07 ENCOUNTER — Encounter (HOSPITAL_BASED_OUTPATIENT_CLINIC_OR_DEPARTMENT_OTHER)
Admission: RE | Disposition: A | Discharge status: Home / Self Care | Payer: Self-pay | Source: Ambulatory Visit | Attending: Plastic Surgery

## 2018-08-07 ENCOUNTER — Encounter (HOSPITAL_BASED_OUTPATIENT_CLINIC_OR_DEPARTMENT_OTHER): Payer: Self-pay

## 2018-08-07 DIAGNOSIS — E119 Type 2 diabetes mellitus without complications: Secondary | ICD-10-CM | POA: Insufficient documentation

## 2018-08-07 DIAGNOSIS — X16XXXA Contact with hot heating appliances, radiators and pipes, initial encounter: Secondary | ICD-10-CM | POA: Insufficient documentation

## 2018-08-07 DIAGNOSIS — T310 Burns involving less than 10% of body surface: Secondary | ICD-10-CM

## 2018-08-07 DIAGNOSIS — T25332A Burn of third degree of left toe(s) (nail), initial encounter: Secondary | ICD-10-CM | POA: Insufficient documentation

## 2018-08-07 DIAGNOSIS — Y92009 Unspecified place in unspecified non-institutional (private) residence as the place of occurrence of the external cause: Secondary | ICD-10-CM | POA: Insufficient documentation

## 2018-08-07 DIAGNOSIS — Z79899 Other long term (current) drug therapy: Secondary | ICD-10-CM | POA: Insufficient documentation

## 2018-08-07 DIAGNOSIS — Z794 Long term (current) use of insulin: Secondary | ICD-10-CM | POA: Insufficient documentation

## 2018-08-07 HISTORY — DX: Fatty (change of) liver, not elsewhere classified: K76.0

## 2018-08-07 HISTORY — DX: Unspecified fracture of shaft of right fibula, initial encounter for closed fracture: S82.401A

## 2018-08-07 HISTORY — DX: Hydrocele, unspecified: N43.3

## 2018-08-07 HISTORY — PX: EXCISION MASS LOWER EXTREMETIES: SHX6705

## 2018-08-07 HISTORY — DX: Essential (primary) hypertension: I10

## 2018-08-07 HISTORY — PX: APPLICATION OF A-CELL OF EXTREMITY: SHX6303

## 2018-08-07 HISTORY — DX: Disorders of diaphragm: J98.6

## 2018-08-07 HISTORY — DX: Personal history of other diseases of the nervous system and sense organs: Z86.69

## 2018-08-07 HISTORY — DX: Burn of unspecified body region, unspecified degree: T30.0

## 2018-08-07 HISTORY — DX: Cyst of epididymis: N50.3

## 2018-08-07 LAB — POCT I-STAT, CHEM 8
BUN: 21 mg/dL — AB (ref 6–20)
CALCIUM ION: 1.25 mmol/L (ref 1.15–1.40)
CREATININE: 0.9 mg/dL (ref 0.61–1.24)
Chloride: 102 mmol/L (ref 98–111)
GLUCOSE: 166 mg/dL — AB (ref 70–99)
HCT: 38 % — ABNORMAL LOW (ref 39.0–52.0)
Hemoglobin: 12.9 g/dL — ABNORMAL LOW (ref 13.0–17.0)
POTASSIUM: 4.5 mmol/L (ref 3.5–5.1)
Sodium: 140 mmol/L (ref 135–145)
TCO2: 29 mmol/L (ref 22–32)

## 2018-08-07 LAB — GLUCOSE, CAPILLARY: GLUCOSE-CAPILLARY: 149 mg/dL — AB (ref 70–99)

## 2018-08-07 SURGERY — EXCISION MASS LOWER EXTREMITIES
Anesthesia: General | Site: Foot | Laterality: Left

## 2018-08-07 MED ORDER — ONDANSETRON HCL 4 MG/2ML IJ SOLN
INTRAMUSCULAR | Status: AC
  Filled 2018-08-07: qty 2

## 2018-08-07 MED ORDER — CEFAZOLIN SODIUM-DEXTROSE 2-4 GM/100ML-% IV SOLN
INTRAVENOUS | Status: AC
  Filled 2018-08-07: qty 100

## 2018-08-07 MED ORDER — SODIUM CHLORIDE 0.9 % IV SOLN
250.0000 mL | INTRAVENOUS | Status: DC | PRN
  Filled 2018-08-07: qty 250

## 2018-08-07 MED ORDER — MIDAZOLAM HCL 2 MG/2ML IJ SOLN
INTRAMUSCULAR | Status: AC
  Filled 2018-08-07: qty 2

## 2018-08-07 MED ORDER — SODIUM CHLORIDE 0.9% FLUSH
3.0000 mL | INTRAVENOUS | Status: DC | PRN
  Filled 2018-08-07: qty 3

## 2018-08-07 MED ORDER — PROPOFOL 10 MG/ML IV BOLUS
INTRAVENOUS | Status: DC | PRN
  Administered 2018-08-07: 220 mg via INTRAVENOUS

## 2018-08-07 MED ORDER — ACETAMINOPHEN 650 MG RE SUPP
650.0000 mg | RECTAL | Status: DC | PRN
  Filled 2018-08-07: qty 1

## 2018-08-07 MED ORDER — LACTATED RINGERS IV SOLN
INTRAVENOUS | Status: DC
  Administered 2018-08-07: 09:00:00 via INTRAVENOUS
  Filled 2018-08-07: qty 1000

## 2018-08-07 MED ORDER — ACETAMINOPHEN 325 MG PO TABS
650.0000 mg | ORAL_TABLET | ORAL | Status: DC | PRN
  Filled 2018-08-07: qty 2

## 2018-08-07 MED ORDER — LIDOCAINE HCL (CARDIAC) PF 100 MG/5ML IV SOSY
PREFILLED_SYRINGE | INTRAVENOUS | Status: DC | PRN
  Administered 2018-08-07: 30 mg via INTRAVENOUS

## 2018-08-07 MED ORDER — ONDANSETRON HCL 4 MG/2ML IJ SOLN
INTRAMUSCULAR | Status: DC | PRN
  Administered 2018-08-07: 4 mg via INTRAVENOUS

## 2018-08-07 MED ORDER — FENTANYL CITRATE (PF) 100 MCG/2ML IJ SOLN
INTRAMUSCULAR | Status: AC
  Filled 2018-08-07: qty 2

## 2018-08-07 MED ORDER — PROMETHAZINE HCL 25 MG/ML IJ SOLN
6.2500 mg | INTRAMUSCULAR | Status: DC | PRN
  Filled 2018-08-07: qty 1

## 2018-08-07 MED ORDER — CEFAZOLIN SODIUM-DEXTROSE 2-4 GM/100ML-% IV SOLN
2.0000 g | INTRAVENOUS | Status: AC
  Administered 2018-08-07: 2 g via INTRAVENOUS
  Filled 2018-08-07: qty 100

## 2018-08-07 MED ORDER — OXYCODONE HCL 5 MG PO TABS
5.0000 mg | ORAL_TABLET | ORAL | Status: DC | PRN
  Filled 2018-08-07: qty 2

## 2018-08-07 MED ORDER — SODIUM CHLORIDE 0.9% FLUSH
3.0000 mL | Freq: Two times a day (BID) | INTRAVENOUS | Status: DC
  Filled 2018-08-07: qty 3

## 2018-08-07 MED ORDER — MIDAZOLAM HCL 5 MG/5ML IJ SOLN
INTRAMUSCULAR | Status: DC | PRN
  Administered 2018-08-07: 2 mg via INTRAVENOUS

## 2018-08-07 MED ORDER — FENTANYL CITRATE (PF) 100 MCG/2ML IJ SOLN
INTRAMUSCULAR | Status: DC | PRN
  Administered 2018-08-07 (×2): 25 ug via INTRAVENOUS
  Administered 2018-08-07: 50 ug via INTRAVENOUS

## 2018-08-07 MED ORDER — FENTANYL CITRATE (PF) 100 MCG/2ML IJ SOLN
25.0000 ug | INTRAMUSCULAR | Status: DC | PRN
  Filled 2018-08-07: qty 1

## 2018-08-07 SURGICAL SUPPLY — 68 items
BAG DECANTER FOR FLEXI CONT (MISCELLANEOUS) IMPLANT
BANDAGE ELASTIC 3 VELCRO ST LF (GAUZE/BANDAGES/DRESSINGS) IMPLANT
BANDAGE ELASTIC 4 VELCRO ST LF (GAUZE/BANDAGES/DRESSINGS) ×2 IMPLANT
BANDAGE ELASTIC 6 VELCRO ST LF (GAUZE/BANDAGES/DRESSINGS) IMPLANT
BENZOIN TINCTURE PRP APPL 2/3 (GAUZE/BANDAGES/DRESSINGS) IMPLANT
BLADE SURG 10 STRL SS (BLADE) ×2 IMPLANT
BLADE SURG 15 STRL LF DISP TIS (BLADE) ×1 IMPLANT
BLADE SURG 15 STRL SS (BLADE) ×1
BNDG COHESIVE 1X5 TAN STRL LF (GAUZE/BANDAGES/DRESSINGS) IMPLANT
BNDG COHESIVE 4X5 TAN NS LF (GAUZE/BANDAGES/DRESSINGS) IMPLANT
BNDG ESMARK 4X9 LF (GAUZE/BANDAGES/DRESSINGS) IMPLANT
BNDG GAUZE ELAST 4 BULKY (GAUZE/BANDAGES/DRESSINGS) ×2 IMPLANT
CANISTER SUCT 3000ML PPV (MISCELLANEOUS) ×2 IMPLANT
CANISTER SUCTION 1200CC (MISCELLANEOUS) IMPLANT
CLOTH BEACON ORANGE TIMEOUT ST (SAFETY) ×2 IMPLANT
COVER BACK TABLE 60X90IN (DRAPES) ×2 IMPLANT
COVER WAND RF STERILE (DRAPES) ×4 IMPLANT
DECANTER SPIKE VIAL GLASS SM (MISCELLANEOUS) IMPLANT
DRAIN PENROSE 18X1/2 LTX STRL (DRAIN) ×2 IMPLANT
DRAPE EXTREMITY T 121X128X90 (DRAPE) ×2 IMPLANT
DRAPE SHEET LG 3/4 BI-LAMINATE (DRAPES) IMPLANT
DRSG EMULSION OIL 3X3 NADH (GAUZE/BANDAGES/DRESSINGS) ×2 IMPLANT
ELECT NEEDLE BLADE 2-5/6 (NEEDLE) IMPLANT
ELECT NEEDLE TIP 2.8 STRL (NEEDLE) IMPLANT
ELECT REM PT RETURN 9FT ADLT (ELECTROSURGICAL) ×2
ELECTRODE REM PT RTRN 9FT ADLT (ELECTROSURGICAL) ×1 IMPLANT
GAUZE SPONGE 4X4 12PLY STRL (GAUZE/BANDAGES/DRESSINGS) ×2 IMPLANT
GAUZE SPONGE 4X4 12PLY STRL LF (GAUZE/BANDAGES/DRESSINGS) ×2 IMPLANT
GLOVE BIO SURGEON STRL SZ 6.5 (GLOVE) ×4 IMPLANT
GOWN STRL REUS W/ TWL XL LVL3 (GOWN DISPOSABLE) ×1 IMPLANT
GOWN STRL REUS W/TWL XL LVL3 (GOWN DISPOSABLE) ×1
IV NS IRRIG 3000ML ARTHROMATIC (IV SOLUTION) IMPLANT
KIT TURNOVER CYSTO (KITS) ×2 IMPLANT
MATRIX WOUND 3-LAYER 5X5 (Tissue) ×2 IMPLANT
MICROMATRIX 500MG (Tissue) ×2 IMPLANT
NEEDLE 27GAX1X1/2 (NEEDLE) IMPLANT
NEEDLE HYPO 30GX1 BEV (NEEDLE) IMPLANT
NS IRRIG 500ML POUR BTL (IV SOLUTION) ×2 IMPLANT
PACK BASIN DAY SURGERY FS (CUSTOM PROCEDURE TRAY) ×2 IMPLANT
PENCIL BUTTON HOLSTER BLD 10FT (ELECTRODE) ×2 IMPLANT
SLEEVE SCD COMPRESS KNEE MED (MISCELLANEOUS) IMPLANT
SOLUTION PARTIC MCRMTRX 500MG (Tissue) ×1 IMPLANT
SPLINT FIBERGLASS 4X30 (CAST SUPPLIES) ×2 IMPLANT
SPLINT PLASTER CAST XFAST 3X15 (CAST SUPPLIES) IMPLANT
SPLINT PLASTER XTRA FASTSET 3X (CAST SUPPLIES)
SPONGE LAP 18X18 RF (DISPOSABLE) IMPLANT
SPONGE LAP 4X18 RFD (DISPOSABLE) IMPLANT
STRIP CLOSURE SKIN 1/2X4 (GAUZE/BANDAGES/DRESSINGS) IMPLANT
SUCTION FRAZIER HANDLE 10FR (MISCELLANEOUS)
SUCTION TUBE FRAZIER 10FR DISP (MISCELLANEOUS) IMPLANT
SURGILUBE 2OZ TUBE FLIPTOP (MISCELLANEOUS) ×4 IMPLANT
SUT ETHILON 3 0 PS 1 (SUTURE) IMPLANT
SUT ETHILON 4 0 P 3 18 (SUTURE) IMPLANT
SUT ETHILON 5 0 PS 2 18 (SUTURE) IMPLANT
SUT PROLENE 3 0 PS 2 (SUTURE) IMPLANT
SUT SILK 3 0 PS 1 (SUTURE) IMPLANT
SUT VIC AB 3-0 FS2 27 (SUTURE) IMPLANT
SUT VIC AB 5-0 PS2 18 (SUTURE) ×4 IMPLANT
SYR BULB IRRIGATION 50ML (SYRINGE) IMPLANT
SYR CONTROL 10ML LL (SYRINGE) ×2 IMPLANT
TAPE HYPAFIX 6X30 (GAUZE/BANDAGES/DRESSINGS) IMPLANT
TIP RIGID 35CM EVICEL (HEMOSTASIS) IMPLANT
TOWEL OR 17X24 6PK STRL BLUE (TOWEL DISPOSABLE) ×2 IMPLANT
TRAY DSU PREP LF (CUSTOM PROCEDURE TRAY) IMPLANT
TUBE CONNECTING 12X1/4 (SUCTIONS) IMPLANT
UNDERPAD 30X30 (UNDERPADS AND DIAPERS) ×2 IMPLANT
WATER STERILE IRR 1000ML POUR (IV SOLUTION) ×2 IMPLANT
YANKAUER SUCT BULB TIP NO VENT (SUCTIONS) ×2 IMPLANT
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
## 2018-08-07 NOTE — Discharge Instructions (Signed)
Do not get foot wet. Keep elevated as able. KY gel to the toes daily starting on Wednesday. Keep in splint. Follow up in one week.  Call your surgeon if you experience:   1.  Fever over 101.0. 2.  Inability to urinate. 3.  Nausea and/or vomiting. 4.  Extreme swelling or bruising at the surgical site. 5.  Continued bleeding from the incision. 6.  Increased pain, redness or drainage from the incision. 7.  Problems related to your pain medication. 8.  Any problems and/or concerns.   Post Anesthesia Home Care Instructions  Activity: Get plenty of rest for the remainder of the day. A responsible individual must stay with you for 24 hours following the procedure.  For the next 24 hours, DO NOT: -Drive a car -Advertising copywriterperate machinery -Drink alcoholic beverages -Take any medication unless instructed by your physician -Make any legal decisions or sign important papers.  Meals: Start with liquid foods such as gelatin or soup. Progress to regular foods as tolerated. Avoid greasy, spicy, heavy foods. If nausea and/or vomiting occur, drink only clear liquids until the nausea and/or vomiting subsides. Call your physician if vomiting continues.  Special Instructions/Symptoms: Your throat may feel dry or sore from the anesthesia or the breathing tube placed in your throat during surgery. If this causes discomfort, gargle with warm salt water. The discomfort should disappear within 24 hours.

## 2018-08-07 NOTE — Anesthesia Procedure Notes (Signed)
Procedure Name: LMA Insertion Date/Time: 08/07/2018 10:31 AM Performed by: Shanon PayorGregory, Catricia Scheerer M, CRNA Pre-anesthesia Checklist: Patient identified, Emergency Drugs available, Suction available, Patient being monitored and Timeout performed Patient Re-evaluated:Patient Re-evaluated prior to induction Oxygen Delivery Method: Circle system utilized Preoxygenation: Pre-oxygenation with 100% oxygen Induction Type: IV induction LMA: LMA inserted LMA Size: 5.0 Number of attempts: 1 Placement Confirmation: positive ETCO2 and breath sounds checked- equal and bilateral Tube secured with: Tape Dental Injury: Teeth and Oropharynx as per pre-operative assessment

## 2018-08-07 NOTE — Anesthesia Preprocedure Evaluation (Signed)
Anesthesia Evaluation  Patient identified by MRN, date of birth, ID band Patient awake    Reviewed: Allergy & Precautions, NPO status , Patient's Chart, lab work & pertinent test results  Airway Mallampati: II  TM Distance: >3 FB Neck ROM: Full    Dental no notable dental hx.    Pulmonary neg pulmonary ROS,    Pulmonary exam normal breath sounds clear to auscultation       Cardiovascular hypertension, Normal cardiovascular exam Rhythm:Regular Rate:Normal     Neuro/Psych negative neurological ROS  negative psych ROS   GI/Hepatic negative GI ROS, Neg liver ROS,   Endo/Other  diabetes, Type 2  Renal/GU negative Renal ROS  negative genitourinary   Musculoskeletal negative musculoskeletal ROS (+)   Abdominal   Peds negative pediatric ROS (+)  Hematology negative hematology ROS (+)   Anesthesia Other Findings   Reproductive/Obstetrics negative OB ROS                             Anesthesia Physical Anesthesia Plan  ASA: II  Anesthesia Plan: General   Post-op Pain Management:    Induction: Intravenous  PONV Risk Score and Plan: 2 and Ondansetron and Treatment may vary due to age or medical condition  Airway Management Planned: LMA  Additional Equipment:   Intra-op Plan:   Post-operative Plan: Extubation in OR  Informed Consent: I have reviewed the patients History and Physical, chart, labs and discussed the procedure including the risks, benefits and alternatives for the proposed anesthesia with the patient or authorized representative who has indicated his/her understanding and acceptance.   Dental advisory given  Plan Discussed with: CRNA and Surgeon  Anesthesia Plan Comments:         Anesthesia Quick Evaluation
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
## 2018-08-08 ENCOUNTER — Encounter (HOSPITAL_BASED_OUTPATIENT_CLINIC_OR_DEPARTMENT_OTHER): Payer: Self-pay | Admitting: Plastic Surgery

## 2018-08-08 NOTE — Addendum Note (Signed)
Addendum  created 08/08/18 1008 by Briant Sitesenenny, Kemonte Ullman T, CRNA   Charge Capture section accepted

## 2018-08-15 ENCOUNTER — Encounter: Payer: Self-pay | Admitting: Physician Assistant

## 2018-08-15 ENCOUNTER — Ambulatory Visit (INDEPENDENT_AMBULATORY_CARE_PROVIDER_SITE_OTHER): Payer: Self-pay | Admitting: Physician Assistant

## 2018-08-15 ENCOUNTER — Encounter: Payer: Self-pay | Admitting: Plastic Surgery

## 2018-08-15 VITALS — BP 138/74 | HR 84 | Ht 73.0 in | Wt 210.0 lb

## 2018-08-15 DIAGNOSIS — L97509 Non-pressure chronic ulcer of other part of unspecified foot with unspecified severity: Secondary | ICD-10-CM

## 2018-08-15 DIAGNOSIS — T25322A Burn of third degree of left foot, initial encounter: Secondary | ICD-10-CM

## 2018-08-15 DIAGNOSIS — E11621 Type 2 diabetes mellitus with foot ulcer: Secondary | ICD-10-CM
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
## 2018-08-22 ENCOUNTER — Encounter: Payer: Self-pay | Admitting: Physician Assistant

## 2018-08-22 ENCOUNTER — Ambulatory Visit (INDEPENDENT_AMBULATORY_CARE_PROVIDER_SITE_OTHER): Payer: Self-pay | Admitting: Physician Assistant

## 2018-08-22 VITALS — BP 110/70 | HR 107 | Ht 72.0 in | Wt 211.0 lb

## 2018-08-22 DIAGNOSIS — L97509 Non-pressure chronic ulcer of other part of unspecified foot with unspecified severity: Secondary | ICD-10-CM

## 2018-08-22 DIAGNOSIS — T25322A Burn of third degree of left foot, initial encounter: Secondary | ICD-10-CM

## 2018-08-22 DIAGNOSIS — E11621 Type 2 diabetes mellitus with foot ulcer: Secondary | ICD-10-CM
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
## 2018-09-08 ENCOUNTER — Ambulatory Visit: Payer: Self-pay | Admitting: Plastic Surgery

## 2020-10-13 ENCOUNTER — Other Ambulatory Visit: Payer: Self-pay

## 2020-10-13 ENCOUNTER — Encounter (HOSPITAL_COMMUNITY): Payer: Self-pay

## 2020-10-13 ENCOUNTER — Emergency Department (HOSPITAL_COMMUNITY)
Admission: EM | Admit: 2020-10-13 | Discharge: 2020-10-13 | Disposition: A | Discharge status: Home / Self Care | Payer: HRSA Program | Attending: Emergency Medicine | Admitting: Emergency Medicine

## 2020-10-13 DIAGNOSIS — I10 Essential (primary) hypertension: Secondary | ICD-10-CM | POA: Diagnosis not present

## 2020-10-13 DIAGNOSIS — R0981 Nasal congestion: Secondary | ICD-10-CM

## 2020-10-13 DIAGNOSIS — Z794 Long term (current) use of insulin: Secondary | ICD-10-CM | POA: Insufficient documentation

## 2020-10-13 DIAGNOSIS — J029 Acute pharyngitis, unspecified: Secondary | ICD-10-CM | POA: Diagnosis present

## 2020-10-13 DIAGNOSIS — E119 Type 2 diabetes mellitus without complications: Secondary | ICD-10-CM | POA: Insufficient documentation

## 2020-10-13 DIAGNOSIS — Z7984 Long term (current) use of oral hypoglycemic drugs: Secondary | ICD-10-CM | POA: Insufficient documentation

## 2020-10-13 DIAGNOSIS — U071 COVID-19: Secondary | ICD-10-CM | POA: Insufficient documentation

## 2020-10-13 DIAGNOSIS — Z79899 Other long term (current) drug therapy: Secondary | ICD-10-CM | POA: Diagnosis not present

## 2020-10-13 LAB — GROUP A STREP BY PCR: Group A Strep by PCR: NOT DETECTED

## 2020-10-13 MED ORDER — PSEUDOEPHEDRINE HCL ER 120 MG PO TB12: 120.0000 mg | ORAL_TABLET | Freq: Two times a day (BID) | ORAL | 0 refills | Status: DC

## 2020-10-13 MED ORDER — GUAIFENESIN ER 1200 MG PO TB12: 1.0000 | ORAL_TABLET | Freq: Two times a day (BID) | ORAL | 0 refills | Status: DC
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
## 2020-10-13 NOTE — ED Triage Notes (Signed)
Pt presents with c/o sore throat that started yesterday. Pt believes he has a sinus infection.

## 2020-10-13 NOTE — ED Notes (Signed)
An After Visit Summary was printed and given to the patient. Discharge instructions given and no further questions at this time.  

## 2020-10-13 NOTE — Discharge Instructions (Signed)
Take over-the-counter medications such as Tylenol or ibuprofen for your sore throat.  Take Sudafed and Mucinex to help with the congestion.  Your Covid test should result within the next 24 hours.  Check your MyChart

## 2020-10-14 LAB — SARS CORONAVIRUS 2 (TAT 6-24 HRS): SARS Coronavirus 2: POSITIVE — AB

## 2020-10-15 ENCOUNTER — Telehealth: Payer: Self-pay | Admitting: Physician Assistant

## 2020-10-15 ENCOUNTER — Telehealth (HOSPITAL_COMMUNITY): Payer: Self-pay | Admitting: Pharmacist

## 2020-10-15 ENCOUNTER — Telehealth: Payer: Self-pay | Admitting: Nurse Practitioner

## 2020-10-15 ENCOUNTER — Other Ambulatory Visit: Payer: Self-pay | Admitting: Physician Assistant

## 2020-10-15 ENCOUNTER — Ambulatory Visit (HOSPITAL_COMMUNITY)
Admission: RE | Admit: 2020-10-15 | Discharge: 2020-10-15 | Disposition: A | Discharge status: Home / Self Care | Payer: Self-pay | Source: Ambulatory Visit | Attending: Pulmonary Disease | Admitting: Pulmonary Disease

## 2020-10-15 DIAGNOSIS — Z794 Long term (current) use of insulin: Secondary | ICD-10-CM

## 2020-10-15 DIAGNOSIS — E119 Type 2 diabetes mellitus without complications: Secondary | ICD-10-CM

## 2020-10-15 DIAGNOSIS — Z79899 Other long term (current) drug therapy: Secondary | ICD-10-CM

## 2020-10-15 DIAGNOSIS — U071 COVID-19: Secondary | ICD-10-CM

## 2020-10-15 DIAGNOSIS — I10 Essential (primary) hypertension: Secondary | ICD-10-CM

## 2020-10-15 LAB — BASIC METABOLIC PANEL
Anion gap: 10 (ref 5–15)
BUN: 21 mg/dL — ABNORMAL HIGH (ref 6–20)
CO2: 28 mmol/L (ref 22–32)
Calcium: 9.7 mg/dL (ref 8.9–10.3)
Chloride: 101 mmol/L (ref 98–111)
Creatinine, Ser: 1.17 mg/dL (ref 0.61–1.24)
GFR, Estimated: >60 mL/min (ref >60)
Glucose, Bld: 181 mg/dL — ABNORMAL HIGH (ref 70–99)
Potassium: 4.2 mmol/L (ref 3.5–5.1)
Sodium: 139 mmol/L (ref 135–145)

## 2020-10-15 MED ORDER — NIRMATRELVIR/RITONAVIR (PAXLOVID)TABLET: 3.0000 | ORAL_TABLET | Freq: Two times a day (BID) | ORAL | 0 refills | Status: AC

## 2020-10-15 MED FILL — PAXLOVID 20 X 150 MG & 10 X: 20 X 150 MG | 5 days supply | Qty: 30 | Fill #0

## 2020-10-15 NOTE — Progress Notes (Signed)
Blood collected for BMP,sent to lab.

## 2020-10-15 NOTE — Telephone Encounter (Signed)
Called to discuss with patient about COVID-19 symptoms and the use of one of the available treatments for those with mild to moderate Covid symptoms and at a high risk of hospitalization.  Pt appears to qualify for outpatient treatment due to co-morbid conditions and/or a member of an at-risk group in accordance with the FDA Emergency Use Authorization.    Symptom onset: unknown Vaccinated: yes according to referral list Booster? unknown Immunocompromised? No   Qualifiers: BMI>25, HTN, elevated SVI  Unable to reach pt - left VM, text and mychart msg  Cline Crock

## 2020-10-15 NOTE — Telephone Encounter (Signed)
Patient was prescribed oral covid treatment paxlovid and treatment note was reviewed. Medication has been received by [facility] and reviewed for appropriateness.  Drug Interactions or Dosage Adjustments Noted: CrCl was calculated at 153ml/min Patient also reports taking simvastatin.  He has been told to hold the simvastatin until 12 hours after his LAST dose of paxlovid.  Delivery Method: pt will pick up  Patient contacted for counseling on 10/15/2020 and verbalized understanding.   Delivery or Pick-Up Date: 10/15/2020  Larrie Kass 10/15/2020, 4:41 PM [facility] Pharmacist Phone# [phone]

## 2020-10-15 NOTE — Telephone Encounter (Signed)
Called to discuss with patient about COVID-19 symptoms and the use of one of the available treatments for those with mild to moderate Covid symptoms and at a high risk of hospitalization.  Pt appears to qualify for outpatient treatment due to co-morbid conditions and/or a member of an at-risk group in accordance with the FDA Emergency Use Authorization.    Symptom onset: 10/12/2020 Vaccinated: No Booster? No Immunocompromised? Yes- unvaccinated Qualifiers: DM, unvaccinated  Patient was not aware that he was Covid-19 positive. He is having mild symptoms, sore throat, cough- feels he is getting better. He was working as a Transport planner.  Reviewed positive test results. Encourage patient to notify his Employer and to isolate. Isolation guidance reviewed.  Discussed possible treatment options.  Patient would like to call back at a later time for further discussion  Trinidad Curet , AGNP-C

## 2020-10-15 NOTE — Progress Notes (Signed)
Called to discuss with patient about COVID-19 symptoms and the use of one of the available treatments for those with mild to moderate Covid symptoms and at a high risk of hospitalization.  Pt appears to qualify for outpatient treatment due to co-morbid conditions and/or a member of an at-risk group in accordance with the FDA Emergency Use Authorization.    Symptom onset: 1/30 Vaccinated: No Booster? No Immunocompromised? No Qualifiers: DM, HTN, BMI > 25  Patient does not want monoclonal antibody due to potential cost. He is interested in Paxlovid. Will come to lab this afternoon and will need order based on result.   [employer]

## 2020-10-15 NOTE — Telephone Encounter (Signed)
Patient calling back.   °
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
## 2020-12-30 ENCOUNTER — Encounter (HOSPITAL_COMMUNITY): Payer: Self-pay | Admitting: Emergency Medicine

## 2020-12-30 ENCOUNTER — Emergency Department (HOSPITAL_COMMUNITY)
Admission: EM | Admit: 2020-12-30 | Discharge: 2020-12-30 | Disposition: A | Discharge status: Home / Self Care | Payer: Self-pay | Attending: Emergency Medicine | Admitting: Emergency Medicine

## 2020-12-30 DIAGNOSIS — H6092 Unspecified otitis externa, left ear: Secondary | ICD-10-CM | POA: Insufficient documentation

## 2020-12-30 DIAGNOSIS — Z7984 Long term (current) use of oral hypoglycemic drugs: Secondary | ICD-10-CM | POA: Insufficient documentation

## 2020-12-30 DIAGNOSIS — E119 Type 2 diabetes mellitus without complications: Secondary | ICD-10-CM | POA: Insufficient documentation

## 2020-12-30 DIAGNOSIS — Z794 Long term (current) use of insulin: Secondary | ICD-10-CM | POA: Insufficient documentation

## 2020-12-30 DIAGNOSIS — H60502 Unspecified acute noninfective otitis externa, left ear: Secondary | ICD-10-CM

## 2020-12-30 DIAGNOSIS — I10 Essential (primary) hypertension: Secondary | ICD-10-CM | POA: Insufficient documentation

## 2020-12-30 DIAGNOSIS — Z79899 Other long term (current) drug therapy: Secondary | ICD-10-CM | POA: Insufficient documentation

## 2020-12-30 MED ORDER — NEOMYCIN-POLYMYXIN-HC 3.5-10000-1 OT SUSP: 4.0000 [drp] | Freq: Three times a day (TID) | OTIC | 0 refills | Status: AC
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
## 2020-12-30 NOTE — ED Triage Notes (Signed)
Patient here from home reporting left ear pain with fullness that started yesterday. Report peroxide rinse with no relief.

## 2021-05-07 ENCOUNTER — Encounter: Payer: Self-pay | Admitting: Orthopedic Surgery

## 2021-05-07 ENCOUNTER — Ambulatory Visit (INDEPENDENT_AMBULATORY_CARE_PROVIDER_SITE_OTHER): Payer: 59 | Admitting: Orthopedic Surgery

## 2021-05-07 ENCOUNTER — Other Ambulatory Visit: Payer: Self-pay

## 2021-05-07 DIAGNOSIS — M6702 Short Achilles tendon (acquired), left ankle: Secondary | ICD-10-CM | POA: Diagnosis not present

## 2021-05-07 DIAGNOSIS — M6701 Short Achilles tendon (acquired), right ankle: Secondary | ICD-10-CM

## 2021-05-07 DIAGNOSIS — Q661 Congenital talipes calcaneovarus, unspecified foot: Secondary | ICD-10-CM
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
## 2021-05-13 ENCOUNTER — Other Ambulatory Visit: Payer: Self-pay

## 2021-05-13 ENCOUNTER — Ambulatory Visit (HOSPITAL_COMMUNITY)
Admission: RE | Admit: 2021-05-13 | Discharge: 2021-05-13 | Disposition: A | Discharge status: Home / Self Care | Payer: 59 | Source: Ambulatory Visit | Attending: Family | Admitting: Family

## 2021-05-13 ENCOUNTER — Encounter (HOSPITAL_COMMUNITY): Payer: Self-pay

## 2021-05-13 VITALS — BP 111/78 | HR 108 | Temp 97.8°F | Resp 18

## 2021-05-13 DIAGNOSIS — I10 Essential (primary) hypertension: Secondary | ICD-10-CM | POA: Diagnosis not present

## 2021-05-13 DIAGNOSIS — Z8616 Personal history of COVID-19: Secondary | ICD-10-CM | POA: Insufficient documentation

## 2021-05-13 DIAGNOSIS — E785 Hyperlipidemia, unspecified: Secondary | ICD-10-CM | POA: Diagnosis not present

## 2021-05-13 DIAGNOSIS — Z7901 Long term (current) use of anticoagulants: Secondary | ICD-10-CM | POA: Insufficient documentation

## 2021-05-13 DIAGNOSIS — Z28310 Unvaccinated for covid-19: Secondary | ICD-10-CM | POA: Diagnosis not present

## 2021-05-13 DIAGNOSIS — U071 COVID-19: Secondary | ICD-10-CM | POA: Insufficient documentation

## 2021-05-13 DIAGNOSIS — J029 Acute pharyngitis, unspecified: Secondary | ICD-10-CM | POA: Insufficient documentation

## 2021-05-13 DIAGNOSIS — R059 Cough, unspecified: Secondary | ICD-10-CM | POA: Diagnosis not present

## 2021-05-13 DIAGNOSIS — Z794 Long term (current) use of insulin: Secondary | ICD-10-CM | POA: Diagnosis not present

## 2021-05-13 DIAGNOSIS — Z8249 Family history of ischemic heart disease and other diseases of the circulatory system: Secondary | ICD-10-CM | POA: Diagnosis not present

## 2021-05-13 DIAGNOSIS — E119 Type 2 diabetes mellitus without complications: Secondary | ICD-10-CM | POA: Insufficient documentation

## 2021-05-13 DIAGNOSIS — R000 Tachycardia, unspecified: Secondary | ICD-10-CM | POA: Insufficient documentation

## 2021-05-13 DIAGNOSIS — R0981 Nasal congestion: Secondary | ICD-10-CM | POA: Insufficient documentation

## 2021-05-13 DIAGNOSIS — R519 Headache, unspecified: Secondary | ICD-10-CM | POA: Insufficient documentation

## 2021-05-13 DIAGNOSIS — Z79899 Other long term (current) drug therapy: Secondary | ICD-10-CM | POA: Insufficient documentation

## 2021-05-13 DIAGNOSIS — Z7984 Long term (current) use of oral hypoglycemic drugs: Secondary | ICD-10-CM | POA: Insufficient documentation

## 2021-05-13 LAB — POCT RAPID STREP A, ED / UC: Streptococcus, Group A Screen (Direct): NEGATIVE

## 2021-05-13 LAB — SARS CORONAVIRUS 2 (TAT 6-24 HRS): SARS Coronavirus 2: POSITIVE — AB

## 2021-05-13 MED ORDER — LIDOCAINE VISCOUS HCL 2 % MT SOLN: 10.0000 mL | OROMUCOSAL | 0 refills | Status: DC | PRN
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
## 2021-05-13 NOTE — ED Triage Notes (Signed)
Pt reports fever x 1 day, sore throat x 2 days.

## 2021-05-13 NOTE — Discharge Instructions (Addendum)
Recommend start Viscous lidocaine- use 2 teaspoons swish and spit out - do not swallow- every 3 to 4 hours as needed. Continue Tylenol 1000mg  every 8 hours as needed. May also use Theraflu as directed- but if taking Theraflu, do not take additional Tylenol. Rest. Stay at home. Follow-up pending COVID 19 test results and throat culture results.

## 2021-05-15 LAB — CULTURE, GROUP A STREP (THRC)

## 2021-05-31 ENCOUNTER — Emergency Department (HOSPITAL_COMMUNITY)
Admission: EM | Admit: 2021-05-31 | Discharge: 2021-06-01 | Disposition: A | Discharge status: Home / Self Care | Payer: 59 | Attending: Emergency Medicine | Admitting: Emergency Medicine

## 2021-05-31 ENCOUNTER — Emergency Department (HOSPITAL_COMMUNITY): Payer: 59

## 2021-05-31 ENCOUNTER — Encounter (HOSPITAL_COMMUNITY): Payer: Self-pay | Admitting: Emergency Medicine

## 2021-05-31 ENCOUNTER — Other Ambulatory Visit: Payer: Self-pay

## 2021-05-31 DIAGNOSIS — Z79899 Other long term (current) drug therapy: Secondary | ICD-10-CM | POA: Diagnosis not present

## 2021-05-31 DIAGNOSIS — M25572 Pain in left ankle and joints of left foot: Secondary | ICD-10-CM | POA: Diagnosis not present

## 2021-05-31 DIAGNOSIS — I10 Essential (primary) hypertension: Secondary | ICD-10-CM | POA: Insufficient documentation

## 2021-05-31 DIAGNOSIS — E119 Type 2 diabetes mellitus without complications: Secondary | ICD-10-CM | POA: Insufficient documentation

## 2021-05-31 DIAGNOSIS — Z7984 Long term (current) use of oral hypoglycemic drugs: Secondary | ICD-10-CM | POA: Insufficient documentation

## 2021-05-31 DIAGNOSIS — Z794 Long term (current) use of insulin: Secondary | ICD-10-CM | POA: Diagnosis not present

## 2021-05-31 NOTE — ED Triage Notes (Signed)
Patient presents with sudden onset foot pain. Patient hx diabetes and osteomyelitis. Patient states some numbness in the foot, pain feels like a "shock."
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
## 2021-06-01 NOTE — Discharge Instructions (Addendum)
The problem you are having today is similar to when you saw Dr. Lajoyce Corners to talk about your discomfort.  The x-rays did not show anything broken or out of place.  You have very tight tendons and a tight foot which is causing your pain.  Try using heat on the sore area 3-4 times a day and gently stretch the ankle and foot.  Use Tylenol if needed for pain.  See your doctor as needed for problems.

## 2021-06-19 ENCOUNTER — Ambulatory Visit (INDEPENDENT_AMBULATORY_CARE_PROVIDER_SITE_OTHER): Payer: 59 | Admitting: Family

## 2021-06-19 DIAGNOSIS — M79671 Pain in right foot: Secondary | ICD-10-CM

## 2021-06-19 DIAGNOSIS — M79672 Pain in left foot: Secondary | ICD-10-CM

## 2021-06-23 ENCOUNTER — Encounter: Payer: Self-pay | Admitting: Family
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
## 2021-08-10 ENCOUNTER — Ambulatory Visit: Payer: 59 | Admitting: Orthopedic Surgery

## 2022-04-30 ENCOUNTER — Emergency Department (HOSPITAL_COMMUNITY)
Admission: EM | Admit: 2022-04-30 | Discharge: 2022-04-30 | Disposition: A | Discharge status: Home / Self Care | Payer: 59 | Attending: Emergency Medicine | Admitting: Emergency Medicine

## 2022-04-30 ENCOUNTER — Encounter (HOSPITAL_COMMUNITY): Payer: Self-pay

## 2022-04-30 DIAGNOSIS — Z79899 Other long term (current) drug therapy: Secondary | ICD-10-CM | POA: Insufficient documentation

## 2022-04-30 DIAGNOSIS — E119 Type 2 diabetes mellitus without complications: Secondary | ICD-10-CM | POA: Diagnosis not present

## 2022-04-30 DIAGNOSIS — I10 Essential (primary) hypertension: Secondary | ICD-10-CM | POA: Insufficient documentation

## 2022-04-30 DIAGNOSIS — Z7984 Long term (current) use of oral hypoglycemic drugs: Secondary | ICD-10-CM | POA: Diagnosis not present

## 2022-04-30 DIAGNOSIS — Z794 Long term (current) use of insulin: Secondary | ICD-10-CM | POA: Insufficient documentation

## 2022-04-30 DIAGNOSIS — R944 Abnormal results of kidney function studies: Secondary | ICD-10-CM | POA: Insufficient documentation

## 2022-04-30 DIAGNOSIS — H539 Unspecified visual disturbance: Secondary | ICD-10-CM | POA: Diagnosis present

## 2022-04-30 LAB — URINALYSIS, ROUTINE W REFLEX MICROSCOPIC
Bacteria, UA: NONE SEEN
Bilirubin Urine: NEGATIVE
Glucose, UA: >=500 mg/dL — AB
Hgb urine dipstick: NEGATIVE
Ketones, ur: NEGATIVE mg/dL
Leukocytes,Ua: NEGATIVE
Nitrite: NEGATIVE
Protein, ur: NEGATIVE mg/dL
Specific Gravity, Urine: 1.018 (ref 1.005–1.030)
pH: 5 (ref 5.0–8.0)

## 2022-04-30 LAB — BASIC METABOLIC PANEL
Anion gap: 9 (ref 5–15)
BUN: 21 mg/dL — ABNORMAL HIGH (ref 6–20)
CO2: 24 mmol/L (ref 22–32)
Calcium: 8.8 mg/dL — ABNORMAL LOW (ref 8.9–10.3)
Chloride: 104 mmol/L (ref 98–111)
Creatinine, Ser: 1.29 mg/dL — ABNORMAL HIGH (ref 0.61–1.24)
GFR, Estimated: >60 mL/min (ref >60)
Glucose, Bld: 390 mg/dL — ABNORMAL HIGH (ref 70–99)
Potassium: 4.6 mmol/L (ref 3.5–5.1)
Sodium: 137 mmol/L (ref 135–145)

## 2022-04-30 LAB — CBC
HCT: 38.6 % — ABNORMAL LOW (ref 39.0–52.0)
Hemoglobin: 12.6 g/dL — ABNORMAL LOW (ref 13.0–17.0)
MCH: 30.1 pg (ref 26.0–34.0)
MCHC: 32.6 g/dL (ref 30.0–36.0)
MCV: 92.1 fL (ref 80.0–100.0)
Platelets: 260 10*3/uL (ref 150–400)
RBC: 4.19 MIL/uL — ABNORMAL LOW (ref 4.22–5.81)
RDW: 12.9 % (ref 11.5–15.5)
WBC: 8.7 10*3/uL (ref 4.0–10.5)
nRBC: 0 % (ref 0.0–0.2)

## 2022-04-30 LAB — CBG MONITORING, ED
Glucose-Capillary: 348 mg/dL — ABNORMAL HIGH (ref 70–99)
Glucose-Capillary: 246 mg/dL — ABNORMAL HIGH (ref 70–99)

## 2022-04-30 MED ORDER — SODIUM CHLORIDE 0.9 % IV BOLUS
1000.0000 mL | Freq: Once | INTRAVENOUS | Status: AC
  Administered 2022-04-30: 1000 mL via INTRAVENOUS

## 2022-04-30 NOTE — ED Provider Notes (Signed)
[facility]-EMERGENCY DEPT Provider Note   CSN: [id number] Arrival date & time: 04/30/22  0725     History  Chief Complaint  Patient presents with   Blurred Vision    Douglas Murphy is a 56 y.o. male reports a history of insulin-dependent diabetes and hypertension otherwise healthy.  Patient presented for evaluation of transient blurred vision.  Patient reports that he was driving to work this morning around 6:30 AM when he noticed that his vision "fuzzy" he reports his lives with both of his eyes, symptoms lasted around 10 minutes before resolving without intervention. He was able to keep driving.  Patient denies similar symptoms in the past he denies any recurrence of symptoms since that time.  Patient does report a feeling of lightheadedness at that time he denies room spinning sensation.  He reports that has also resolved.  Patient reports that he forgot take his insulin this morning.    Patient denies headache, difficulty speaking, loss of consciousness, nausea, vomiting, diarrhea, dysuria, polyuria, abdominal pain, extremity numbness, tingling, weakness, floaters/flashers, eye pain, injury or any additional concerns.  HPI     Home Medications Prior to Admission medications   Medication Sig Start Date End Date Taking? Authorizing Provider  insulin NPH-regular Human (NOVOLIN 70/30) (70-30) 100 UNIT/ML injection Inject 30 Units into the skin 2 (two) times daily.    [provider]  lidocaine (XYLOCAINE) 2 % solution Use as directed 10 mLs in the mouth or throat every 3 (three) hours as needed (throat pain). Swish and spit out- do not swallow 05/13/21   Sudie Grumbling, NP  lisinopril (PRINIVIL,ZESTRIL) 5 MG tablet Take 5 mg by mouth daily.    [provider]  metFORMIN (GLUCOPHAGE) 1000 MG tablet Take 1,000 mg by mouth 2 (two) times daily.    [provider]      Allergies    Other    Review of Systems   Review of Systems  Ten systems are reviewed and are negative for acute change except as noted in the HPI  Physical Exam Updated Vital Signs BP (!) 138/97   Pulse 92   Temp 97.9 F (36.6 C)   Resp 19   SpO2 100%  Physical Exam Constitutional:      General: He is not in acute distress.    Appearance: Normal appearance. He is well-developed. He is not ill-appearing or diaphoretic.  HENT:     Head: Normocephalic and atraumatic.  Eyes:     General: Vision grossly intact. Gaze aligned appropriately.     Extraocular Movements: Extraocular movements intact.     Conjunctiva/sclera: Conjunctivae normal.     Pupils: Pupils are equal, round, and reactive to light.  Neck:     Trachea: Trachea and phonation normal.  Pulmonary:     Effort: Pulmonary effort is normal. No respiratory distress.  Abdominal:     General: There is no distension.     Palpations: Abdomen is soft.     Tenderness: There is no abdominal tenderness. There is no guarding or rebound.  Musculoskeletal:        General: Normal range of motion.     Cervical back: Normal range of motion.  Skin:    General: Skin is warm and dry.  Neurological:     Mental Status: He is alert.     GCS: GCS eye subscore is 4. GCS verbal subscore is 5. GCS motor subscore is 6.     Comments: Mental Status:  Alert,  oriented, thought content appropriate, able to give a coherent history. Speech fluent without evidence of aphasia. Able to follow 2 step commands without difficulty.  Cranial Nerves:  II:  Peripheral visual fields grossly normal, pupils equal, round, reactive to light III,IV, VI: ptosis not present, extra-ocular motions intact bilaterally  V,VII: smile symmetric, eyebrows raise symmetric, facial light touch sensation equal VIII: hearing grossly normal to voice  X: uvula elevates symmetrically  XI: bilateral shoulder shrug symmetric and strong XII: midline tongue extension without fassiculations Motor:  Normal tone. 5/5 strength in upper and lower  extremities bilaterally including strong and equal grip strength and dorsiflexion/plantar flexion Sensory: Sensation intact to light touch in all extremities. Negative Romberg.  Cerebellar: normal finger-to-nose with bilateral upper extremities. Normal heel-to -shin balance bilaterally of the lower extremity. No pronator drift.  Gait: normal gait and balance CV: distal pulses palpable throughout    Psychiatric:        Behavior: Behavior normal.     ED Results / Procedures / Treatments   Labs (all labs ordered are listed, but only abnormal results are displayed) Labs Reviewed  BASIC METABOLIC PANEL - Abnormal; Notable for the following components:      Result Value   Glucose, Bld 390 (*)    BUN 21 (*)    Creatinine, Ser 1.29 (*)    Calcium 8.8 (*)    All other components within normal limits  CBC - Abnormal; Notable for the following components:   RBC 4.19 (*)    Hemoglobin 12.6 (*)    HCT 38.6 (*)    All other components within normal limits  URINALYSIS, ROUTINE W REFLEX MICROSCOPIC - Abnormal; Notable for the following components:   Color, Urine STRAW (*)    Glucose, UA >=500 (*)    All other components within normal limits  CBG MONITORING, ED - Abnormal; Notable for the following components:   Glucose-Capillary 348 (*)    All other components within normal limits  CBG MONITORING, ED - Abnormal; Notable for the following components:   Glucose-Capillary 246 (*)    All other components within normal limits  CBG MONITORING, ED    EKG EKG Interpretation  Date/Time:  Friday April 30 2022 08:43:11 EDT Ventricular Rate:  94 PR Interval:  174 QRS Duration: 96 QT Interval:  368 QTC Calculation: 461 R Axis:   48 Text Interpretation: Sinus rhythm Confirmed by Cathren Laine ([id number]) on 04/30/2022 9:15:25 AM  Radiology No results found.  Procedures Procedures    Medications Ordered in ED Medications  sodium chloride 0.9 % bolus 1,000 mL (0 mLs Intravenous Stopped  04/30/22 0945)    ED Course/ Medical Decision Making/ A&P Clinical Course as of 04/30/22 1234  Fri Apr 30, 2022  0930 Pt seen by Dr. Denton Lank. Plan to monitor.  Recheck CBG.  Anticipate discharge and PCP follow-up. [BM]  1215 CBG monitoring, ED(!) CBG improved from 348 to 246 following IV fluids. [BM]  1215 Basic metabolic panel(!) BMP shows slight elevation of creatinine at 1.29 up from 1.171-year ago.  May be secondary to dehydration.  Hyperglycemia also noted at 390 suspect due to patient not taking his medications this morning.  No emergent Electra derangement or gap.  Bicarb within normal limits.  Low suspicion for DKA. [BM]  1216 Urinalysis, Routine w reflex microscopic Urine, Clean Catch(!) Urinalysis shows glucosuria.  No evidence of infection.  No ketones to suggest DKA. [BM]  1216 ED EKG I personally reviewed and interpreted patient's sleep twelve-lead EKG.  I not appreciate any obvious acute ischemic changes.  Shows normal sinus rhythm. [BM]    Clinical Course User Index [BM] Elizabeth Palau                           Medical Decision Making 56 year old male presented for transient episode of pain blurry vision while he was driving.  Symptoms resolved after 10 minutes without intervention.  He was still able to drive during this episode.  He denies any associated weakness or numbness.  He denies similar symptoms in the past.  He reports overall has been feeling well recently but forgets to take his insulin this morning before he went to work.  Denies any associated chest pain, shortness of breath, recent infectious symptoms or any additional concerns.  On initial evaluation he is well-appearing no acute distress vital signs stable on room air.  He has a reassuring neurological examination, eye examination also within normal limits.  Basic labs including CBC, BMP, CBG and UA were ordered.  Discussed case with Dr. Denton Lank, no indication for neuroimaging at this point.  Amount  and/or Complexity of Data Reviewed Labs: ordered. Decision-making details documented in ED Course. ECG/medicine tests:  Decision-making details documented in ED Course.  Risk Risk Details: Patient was monitored throughout ER stay he is well-appearing and in no acute distress, no recurrence of symptoms.  His hyperglycemia has improved with IV fluids.  He reports he has his medications at home that he can continue using and does not need refill.  I did discuss imaging such as CT scan and MRI with the patient today, shared decision making patient does not want to pursue any imaging today he would like to be discharged and follow-up with his primary care provider.  Feel this is reasonable overall low suspicion for TIA/CVA, press or other emergent causes of symptoms at this time.  Additionally lab work does not suggest DKA or other emergent derangement.  Additionally patient without chest/neck pain, shortness of breath, nausea, vomiting, low suspicion for ACS, dissection, arrhythmia or other emergent cause of patient's symptoms at this time.  Patient was seen and evaluated by Dr. Denton Lank during this visit who agrees with work-up and discharge with PCP follow-up at this time.   At this time there does not appear to be any evidence of an acute emergency medical condition and the patient appears stable for discharge with appropriate outpatient follow up. Diagnosis was discussed with patient who verbalizes understanding of care plan and is agreeable to discharge. I have discussed return precautions with patient who verbalizes understanding. Patient encouraged to follow-up with their PCP. All questions answered.   Note: Portions of this report may have been transcribed using voice recognition software. Every effort was made to ensure accuracy; however, inadvertent computerized transcription errors may still be present.         Final Clinical Impression(s) / ED Diagnoses Final diagnoses:  Visual  disturbance    Rx / DC Orders ED Discharge Orders     None         Elizabeth Palau 04/30/22 1235    Cathren Laine, MD 04/30/22 1534

## 2022-04-30 NOTE — ED Notes (Signed)
Pt arrived via POV, c/o episode of "feeling faint" on way to work and blurred vision.   Visual acuity Bilaterally 20/20 Left eye 20/15 Right eye 20/20

## 2022-04-30 NOTE — Discharge Instructions (Addendum)
At this time there does not appear to be the presence of an emergent medical condition, however there is always the potential for conditions to change. Please read and follow the below instructions.  Please return to the Emergency Department immediately for any new or worsening symptoms or if your symptoms return. Please be sure to follow up with your Primary Care Provider within one week regarding your visit today; please call their office to schedule an appointment even if you are feeling better for a follow-up visit. Please sure to drink plenty of water and get plenty of rest.  Please take your medication as prescribed by your primary care provider and call your primary care doctor today to inform them of your ER visit and to schedule a follow-up appointment.  Please read the additional information packets attached to your discharge summary.  Go to the nearest Emergency Department immediately if: You have fever or chills You have new visual disturbances or your blurry vision returns. You suddenly see flashing lights or floaters. You suddenly have a dark area in your field of vision, especially in the lower part. This can lead to a loss of central vision. You suddenly lose vision in one or both eyes. You have any symptoms of a stroke. "BE FAST" is an easy way to remember the main warning signs of a stroke: B - Balance. Signs are dizziness, sudden trouble walking, or loss of balance. E - Eyes. Signs are trouble seeing or a sudden change in vision. F - Face. Signs are sudden weakness or numbness of the face, or the face or eyelid drooping on one side. A - Arms. Signs are weakness or numbness in an arm. This happens suddenly and usually on one side of the body. S - Speech. Signs are sudden trouble speaking, slurred speech, or trouble understanding what people say. T - Time. Time to call emergency services. Write down what time symptoms started. Have other signs of a stroke, such as: A sudden,  severe headache with no known cause. Nausea or vomiting. A seizure.          You have any new/concerning or worsening of symptoms.  Do not take your medicine if  develop an itchy rash, swelling in your mouth or lips, or difficulty breathing; call 911 and seek immediate emergency medical attention if this occurs.  You may review your lab tests and imaging results in their entirety on your MyChart account.  Please discuss all results of fully with your primary care provider and other specialist at your follow-up visit.  Note: Portions of this text may have been transcribed using voice recognition software. Every effort was made to ensure accuracy; however, inadvertent computerized transcription errors may still be present.

## 2022-07-11 ENCOUNTER — Emergency Department (HOSPITAL_COMMUNITY)
Admission: EM | Admit: 2022-07-11 | Discharge: 2022-07-11 | Disposition: A | Discharge status: Home / Self Care | Payer: 59 | Attending: Emergency Medicine | Admitting: Emergency Medicine

## 2022-07-11 ENCOUNTER — Encounter (HOSPITAL_COMMUNITY): Payer: Self-pay | Admitting: Emergency Medicine

## 2022-07-11 ENCOUNTER — Other Ambulatory Visit: Payer: Self-pay

## 2022-07-11 DIAGNOSIS — Z794 Long term (current) use of insulin: Secondary | ICD-10-CM | POA: Insufficient documentation

## 2022-07-11 DIAGNOSIS — Z7984 Long term (current) use of oral hypoglycemic drugs: Secondary | ICD-10-CM | POA: Diagnosis not present

## 2022-07-11 DIAGNOSIS — R112 Nausea with vomiting, unspecified: Secondary | ICD-10-CM | POA: Diagnosis present

## 2022-07-11 DIAGNOSIS — Z79899 Other long term (current) drug therapy: Secondary | ICD-10-CM | POA: Diagnosis not present

## 2022-07-11 DIAGNOSIS — E119 Type 2 diabetes mellitus without complications: Secondary | ICD-10-CM | POA: Diagnosis not present

## 2022-07-11 DIAGNOSIS — I10 Essential (primary) hypertension: Secondary | ICD-10-CM | POA: Insufficient documentation

## 2022-07-11 DIAGNOSIS — E860 Dehydration: Secondary | ICD-10-CM | POA: Diagnosis not present

## 2022-07-11 LAB — CBG MONITORING, ED
Glucose-Capillary: 363 mg/dL — ABNORMAL HIGH (ref 70–99)
Glucose-Capillary: 158 mg/dL — ABNORMAL HIGH (ref 70–99)

## 2022-07-11 LAB — COMPREHENSIVE METABOLIC PANEL
ALT: 20 U/L (ref 0–44)
AST: 31 U/L (ref 15–41)
Albumin: 4 g/dL (ref 3.5–5.0)
Alkaline Phosphatase: 65 U/L (ref 38–126)
Anion gap: 15 (ref 5–15)
BUN: 20 mg/dL (ref 6–20)
CO2: 23 mmol/L (ref 22–32)
Calcium: 10 mg/dL (ref 8.9–10.3)
Chloride: 101 mmol/L (ref 98–111)
Creatinine, Ser: 1.19 mg/dL (ref 0.61–1.24)
GFR, Estimated: >60 mL/min (ref >60)
Glucose, Bld: 357 mg/dL — ABNORMAL HIGH (ref 70–99)
Potassium: 4.7 mmol/L (ref 3.5–5.1)
Sodium: 139 mmol/L (ref 135–145)
Total Bilirubin: 0.9 mg/dL (ref 0.3–1.2)
Total Protein: 7.1 g/dL (ref 6.5–8.1)

## 2022-07-11 LAB — LIPASE, BLOOD: Lipase: 29 U/L (ref 11–51)

## 2022-07-11 LAB — CBC
HCT: 38.3 % — ABNORMAL LOW (ref 39.0–52.0)
Hemoglobin: 12.7 g/dL — ABNORMAL LOW (ref 13.0–17.0)
MCH: 29.8 pg (ref 26.0–34.0)
MCHC: 33.2 g/dL (ref 30.0–36.0)
MCV: 89.9 fL (ref 80.0–100.0)
Platelets: 284 10*3/uL (ref 150–400)
RBC: 4.26 MIL/uL (ref 4.22–5.81)
RDW: 13 % (ref 11.5–15.5)
WBC: 15 10*3/uL — ABNORMAL HIGH (ref 4.0–10.5)
nRBC: 0 % (ref 0.0–0.2)

## 2022-07-11 LAB — URINALYSIS, ROUTINE W REFLEX MICROSCOPIC
Bilirubin Urine: NEGATIVE
Glucose, UA: >=500 mg/dL — AB
Hgb urine dipstick: NEGATIVE
Ketones, ur: 40 mg/dL — AB
Leukocytes,Ua: NEGATIVE
Nitrite: NEGATIVE
Protein, ur: NEGATIVE mg/dL
Specific Gravity, Urine: 1.01 (ref 1.005–1.030)
pH: 7 (ref 5.0–8.0)

## 2022-07-11 LAB — URINALYSIS, MICROSCOPIC (REFLEX)
Squamous Epithelial / HPF: NONE SEEN (ref 0–5)
WBC, UA: NONE SEEN WBC/hpf (ref 0–5)

## 2022-07-11 MED ORDER — METOCLOPRAMIDE HCL 10 MG PO TABS: 10.0000 mg | ORAL_TABLET | Freq: Four times a day (QID) | ORAL | 0 refills | Status: DC | PRN

## 2022-07-11 MED ORDER — METOCLOPRAMIDE HCL 5 MG/ML IJ SOLN
10.0000 mg | Freq: Once | INTRAMUSCULAR | Status: AC
  Administered 2022-07-11: 10 mg via INTRAVENOUS
  Filled 2022-07-11: qty 2

## 2022-07-11 MED ORDER — LACTATED RINGERS IV BOLUS
1000.0000 mL | Freq: Once | INTRAVENOUS | Status: AC
  Administered 2022-07-11: 1000 mL via INTRAVENOUS

## 2022-07-11 MED ORDER — SODIUM CHLORIDE 0.9 % IV BOLUS
1000.0000 mL | Freq: Once | INTRAVENOUS | Status: AC
Start: 2022-07-11 — End: 2022-07-11
  Administered 2022-07-11: 1000 mL via INTRAVENOUS

## 2022-07-11 NOTE — ED Provider Triage Note (Signed)
Emergency Medicine Provider Triage Evaluation Note  Douglas Murphy , a 56 y.o. male  was evaluated in triage.  Pt complains of N/V today. Vomited approximately 6 times, unable to keep anything down. Started to feel weak, lightheaded and had some intermittent blurry vision. Worse when standing up. No syncope. Denies abdominal pain, hematemesis, diarrhea, melena, syncope, or unilateral numbness/weakness. CBG in the 400s with EMS  Review of Systems  Per above Physical Exam  BP (!) 154/100   Pulse (!) 110   Temp 97.8 F (36.6 C) (Oral)   Resp 16   SpO2 98%  Gen:   Awake, no distress   Resp:  Normal effort  MSK:   Moves extremities without difficulty  Other:  Abdomen without peritoneal signs. PERRL. EOMI. Vision grossly intact. Sensation & strength grossly intact x 4.   Medical Decision Making  Medically screening exam initiated at 1:31 AM.  Appropriate orders placed.  Douglas Murphy was informed that the remainder of the evaluation will be completed by another provider, this initial triage assessment does not replace that evaluation, and the importance of remaining in the ED until their evaluation is complete.  Nausea/vomiting.    Douglas Murphy, [place] 07/11/22 [phone]

## 2022-07-11 NOTE — ED Notes (Signed)
Patient verbalizes understanding of discharge instructions. Opportunity for questioning and answers were provided. Armband removed by staff, pt discharged from ED. Pt ambulatory to ED waiting room with steady gait.  

## 2022-07-11 NOTE — ED Notes (Signed)
Checked BP twice to make sure it wasn't high

## 2022-07-11 NOTE — ED Provider Notes (Signed)
[facility] EMERGENCY DEPARTMENT Provider Note   CSN: [id number] Arrival date & time: 07/11/22  0119     History  Chief Complaint  Patient presents with   Emesis    Douglas Murphy is a 56 y.o. male.   Emesis Patient presents for nausea and vomiting.  Medical history includes DM, left foot osteomyelitis, HTN.  Patient reports that he was in his normal state of health yesterday morning.  At around 2 PM yesterday, patient began to feel nauseous.  He had multiple episodes of vomiting.  He has had p.o. intolerance since that time.  He persisted to have dry heaving.  He attempted to drive himself to the ED but experiencing blurry vision.  He pulled over and called EMS.  Patient received 500 cc IVF and 4 mg of Zofran with EMS.  He states that Zofran did not improve his symptoms of nausea.  While in the ED waiting room, patient describes a near syncopal episode.  During this episode, he did fall but caught himself on a partition.  He denies any suspected injuries from this episode.  He was given a dose of Reglan in the ED waiting room which did improve his nausea.  Patient denies any abdominal pain.  He denies any other recent symptoms.  For his diabetes, patient takes metformin twice daily.  He did not take his dose yesterday afternoon.  His only other medications are statin and blood pressure medicine.  Currently, patient endorses fatigue but does have resolution of his nausea.      Home Medications Prior to Admission medications   Medication Sig Start Date End Date Taking? Authorizing Provider  metoCLOPramide (REGLAN) 10 MG tablet Take 1 tablet (10 mg total) by mouth every 6 (six) hours as needed for nausea or vomiting. 07/11/22  Yes Gloris Manchester, MD  insulin NPH-regular Human (NOVOLIN 70/30) (70-30) 100 UNIT/ML injection Inject 30 Units into the skin 2 (two) times daily.    [provider]  lidocaine (XYLOCAINE) 2 % solution Use as directed 10 mLs in the mouth  or throat every 3 (three) hours as needed (throat pain). Swish and spit out- do not swallow 05/13/21   Sudie Grumbling, NP  lisinopril (PRINIVIL,ZESTRIL) 5 MG tablet Take 5 mg by mouth daily.    [provider]  metFORMIN (GLUCOPHAGE) 1000 MG tablet Take 1,000 mg by mouth 2 (two) times daily.    [provider]      Allergies    Other    Review of Systems   Review of Systems  Constitutional:  Positive for fatigue.  Eyes:  Positive for visual disturbance.  Gastrointestinal:  Positive for nausea and vomiting.  Neurological:  Positive for light-headedness.  All other systems reviewed and are negative.   Physical Exam Updated Vital Signs BP (!) 147/94 (BP Location: Right Arm)   Pulse (!) 101   Temp 98.4 F (36.9 C)   Resp 16   Ht 6' (1.829 m)   Wt 95.7 kg   SpO2 99%   BMI 28.61 kg/m  Physical Exam Vitals and nursing note reviewed.  Constitutional:      General: He is not in acute distress.    Appearance: Normal appearance. He is well-developed. He is not ill-appearing, toxic-appearing or diaphoretic.  HENT:     Head: Normocephalic and atraumatic.     Right Ear: External ear normal.     Left Ear: External ear normal.     Nose: Nose normal.  Mouth/Throat:     Mouth: Mucous membranes are moist.     Pharynx: Oropharynx is clear.  Eyes:     Extraocular Movements: Extraocular movements intact.     Conjunctiva/sclera: Conjunctivae normal.  Cardiovascular:     Rate and Rhythm: Normal rate and regular rhythm.     Heart sounds: No murmur heard. Pulmonary:     Effort: Pulmonary effort is normal. No respiratory distress.     Breath sounds: Normal breath sounds. No wheezing or rales.  Abdominal:     General: There is no distension.     Palpations: Abdomen is soft.     Tenderness: There is no abdominal tenderness.  Musculoskeletal:        General: No swelling. Normal range of motion.     Cervical back: Normal range of motion and neck supple.     Right  lower leg: No edema.     Left lower leg: No edema.     Comments: Left great toe amputation  Skin:    General: Skin is warm and dry.     Capillary Refill: Capillary refill takes less than 2 seconds.     Coloration: Skin is not jaundiced or pale.  Neurological:     General: No focal deficit present.     Mental Status: He is alert and oriented to person, place, and time.     Cranial Nerves: No cranial nerve deficit.     Sensory: No sensory deficit.     Motor: No weakness.     Coordination: Coordination normal.  Psychiatric:        Mood and Affect: Mood normal.        Behavior: Behavior normal.        Thought Content: Thought content normal.        Judgment: Judgment normal.     ED Results / Procedures / Treatments   Labs (all labs ordered are listed, but only abnormal results are displayed) Labs Reviewed  COMPREHENSIVE METABOLIC PANEL - Abnormal; Notable for the following components:      Result Value   Glucose, Bld 357 (*)    All other components within normal limits  CBC - Abnormal; Notable for the following components:   WBC 15.0 (*)    Hemoglobin 12.7 (*)    HCT 38.3 (*)    All other components within normal limits  URINALYSIS, ROUTINE W REFLEX MICROSCOPIC - Abnormal; Notable for the following components:   Glucose, UA >=500 (*)    Ketones, ur 40 (*)    All other components within normal limits  URINALYSIS, MICROSCOPIC (REFLEX) - Abnormal; Notable for the following components:   Bacteria, UA RARE (*)    All other components within normal limits  CBG MONITORING, ED - Abnormal; Notable for the following components:   Glucose-Capillary 363 (*)    All other components within normal limits  CBG MONITORING, ED - Abnormal; Notable for the following components:   Glucose-Capillary 158 (*)    All other components within normal limits  LIPASE, BLOOD    EKG EKG Interpretation  Date/Time:  Sunday July 11 2022 18:56:59 EDT Ventricular Rate:  98 PR Interval:  168 QRS  Duration: 90 QT Interval:  338 QTC Calculation: 431 R Axis:   32 Text Interpretation: Normal sinus rhythm Confirmed by Gloris Manchester ([id number]) on 07/11/2022 7:34:48 PM  Radiology No results found.  Procedures Procedures    Medications Ordered in ED Medications  sodium chloride 0.9 % bolus 1,000 mL (1,000 mLs Intravenous New  Bag/Given 07/11/22 0422)  metoCLOPramide (REGLAN) injection 10 mg (10 mg Intravenous Given 07/11/22 0423)  lactated ringers bolus 1,000 mL (1,000 mLs Intravenous New Bag/Given 07/11/22 1849)    ED Course/ Medical Decision Making/ A&P                           Medical Decision Making Amount and/or Complexity of Data Reviewed Labs: ordered.   This patient presents to the ED for concern of nausea and vomiting, this involves an extensive number of treatment options, and is a complaint that carries with it a high risk of complications and morbidity.  The differential diagnosis includes enteritis, gastritis, GERD, gastroparesis, dehydration   Co morbidities that complicate the patient evaluation  DM, left foot osteomyelitis, HTN   Additional history obtained:  Additional history obtained from N/A External records from outside source obtained and reviewed including EMR   Lab Tests:  I Ordered, and personally interpreted labs.  The pertinent results include: Hyperglycemia, resolved after IV fluids; baseline anemia; leukocytosis is present, likely stress to margination; electrolytes and hepatobiliary enzymes are normal   Cardiac Monitoring: / EKG:  The patient was maintained on a cardiac monitor.  I personally viewed and interpreted the cardiac monitored which showed an underlying rhythm of: Sinus rhythm  Problem List / ED Course / Critical interventions / Medication management  Patient is a 56 year old male presenting for nausea and vomiting.  Onset was yesterday afternoon.  Prior to being bedded in the ED, diagnostic work-up was initiated.  Lab work is  notable for hyperglycemia and leukocytosis.  Patient was given Reglan while in the ED waiting room and this did resolve his symptoms.  On initial assessment, patient is well-appearing.  He currently denies any nausea.  He denies any current or recent areas of pain.  His abdomen is soft without tenderness.  Patient has a 5-year history of diabetes.  It appears that this is, at times, poorly controlled.  He has been on metformin and states that he is also prescribed 70/30 insulin.  Given his improvement with Reglan, I suspect gastroparesis.  Patient was given additional IV fluids.  Following IV fluids, blood glucose normalized.  Patient remained asymptomatic while in the ED.  He was able to tolerate p.o. intake.  He was prescribed Reglan to take at home as needed.  He was discharged in good condition. I ordered medication including IV fluids for hydration; Reglan for nausea Reevaluation of the patient after these medicines showed that the patient resolved I have reviewed the patients home medicines and have made adjustments as needed   Social Determinants of Health:  Has PCP        Final Clinical Impression(s) / ED Diagnoses Final diagnoses:  Dehydration  Nausea and vomiting, unspecified vomiting type    Rx / DC Orders ED Discharge Orders          Ordered    metoCLOPramide (REGLAN) 10 MG tablet  Every 6 hours PRN        07/11/22 2051              Godfrey Pick, MD 07/11/22 2052

## 2022-07-11 NOTE — ED Triage Notes (Signed)
Pt brought to ED by [facility] with c/o N/V  that has been ongoing throughout the day and blurred vision.    EMS Interventions  Zofran 4mg  NS 500CC Bolus   EMS Vitals BP 178/110 HR 110 SPO2 100 CBG 401

## 2022-07-11 NOTE — Discharge Instructions (Addendum)
A prescription for Reglan was sent to your pharmacy.  This is a medication that treats nausea.  Take as needed.  Follow-up with your primary care doctor for ongoing management of your chronic conditions, including your diabetes.  During plenty of water to stay hydrated.  Return to the emergency department for any return of concerning symptoms.

## 2022-07-11 NOTE — ED Notes (Signed)
Pt had a syncopal episode in the lobby. This tech help pt up off the floor and took him back to triage. Triage nurse and charger nurse both made aware.

## 2023-02-12 ENCOUNTER — Other Ambulatory Visit: Payer: Self-pay

## 2023-02-12 ENCOUNTER — Inpatient Hospital Stay (HOSPITAL_COMMUNITY): Payer: 59

## 2023-02-12 ENCOUNTER — Encounter (HOSPITAL_COMMUNITY): Payer: Self-pay

## 2023-02-12 ENCOUNTER — Emergency Department (HOSPITAL_COMMUNITY): Payer: 59

## 2023-02-12 ENCOUNTER — Inpatient Hospital Stay (HOSPITAL_COMMUNITY)
Admission: EM | Admit: 2023-02-12 | Discharge: 2023-02-18 | DRG: 617 | Disposition: A | Discharge status: Home / Self Care | Payer: 59 | Attending: Family Medicine | Admitting: Family Medicine

## 2023-02-12 DIAGNOSIS — L02612 Cutaneous abscess of left foot: Secondary | ICD-10-CM

## 2023-02-12 DIAGNOSIS — A419 Sepsis, unspecified organism: Secondary | ICD-10-CM | POA: Diagnosis not present

## 2023-02-12 DIAGNOSIS — N179 Acute kidney failure, unspecified: Secondary | ICD-10-CM | POA: Diagnosis not present

## 2023-02-12 DIAGNOSIS — E11621 Type 2 diabetes mellitus with foot ulcer: Secondary | ICD-10-CM | POA: Diagnosis not present

## 2023-02-12 DIAGNOSIS — L97529 Non-pressure chronic ulcer of other part of left foot with unspecified severity: Secondary | ICD-10-CM | POA: Diagnosis not present

## 2023-02-12 DIAGNOSIS — L089 Local infection of the skin and subcutaneous tissue, unspecified: Secondary | ICD-10-CM

## 2023-02-12 DIAGNOSIS — E1151 Type 2 diabetes mellitus with diabetic peripheral angiopathy without gangrene: Secondary | ICD-10-CM | POA: Diagnosis not present

## 2023-02-12 DIAGNOSIS — D649 Anemia, unspecified: Secondary | ICD-10-CM | POA: Diagnosis not present

## 2023-02-12 DIAGNOSIS — E11628 Type 2 diabetes mellitus with other skin complications: Principal | ICD-10-CM | POA: Diagnosis present

## 2023-02-12 DIAGNOSIS — Z79899 Other long term (current) drug therapy: Secondary | ICD-10-CM | POA: Diagnosis not present

## 2023-02-12 DIAGNOSIS — Z91018 Allergy to other foods: Secondary | ICD-10-CM | POA: Diagnosis not present

## 2023-02-12 DIAGNOSIS — Z794 Long term (current) use of insulin: Secondary | ICD-10-CM | POA: Diagnosis not present

## 2023-02-12 DIAGNOSIS — R000 Tachycardia, unspecified: Secondary | ICD-10-CM | POA: Diagnosis not present

## 2023-02-12 DIAGNOSIS — I10 Essential (primary) hypertension: Secondary | ICD-10-CM | POA: Diagnosis present

## 2023-02-12 DIAGNOSIS — L02415 Cutaneous abscess of right lower limb: Secondary | ICD-10-CM

## 2023-02-12 DIAGNOSIS — E1165 Type 2 diabetes mellitus with hyperglycemia: Secondary | ICD-10-CM | POA: Diagnosis not present

## 2023-02-12 DIAGNOSIS — L97519 Non-pressure chronic ulcer of other part of right foot with unspecified severity: Secondary | ICD-10-CM | POA: Diagnosis present

## 2023-02-12 DIAGNOSIS — Z79890 Hormone replacement therapy (postmenopausal): Secondary | ICD-10-CM

## 2023-02-12 DIAGNOSIS — Z89412 Acquired absence of left great toe: Secondary | ICD-10-CM

## 2023-02-12 DIAGNOSIS — E119 Type 2 diabetes mellitus without complications: Secondary | ICD-10-CM

## 2023-02-12 DIAGNOSIS — D638 Anemia in other chronic diseases classified elsewhere: Secondary | ICD-10-CM | POA: Diagnosis not present

## 2023-02-12 DIAGNOSIS — L02611 Cutaneous abscess of right foot: Secondary | ICD-10-CM

## 2023-02-12 DIAGNOSIS — R6520 Severe sepsis without septic shock: Secondary | ICD-10-CM | POA: Diagnosis not present

## 2023-02-12 DIAGNOSIS — E1142 Type 2 diabetes mellitus with diabetic polyneuropathy: Secondary | ICD-10-CM | POA: Diagnosis not present

## 2023-02-12 DIAGNOSIS — Z8249 Family history of ischemic heart disease and other diseases of the circulatory system: Secondary | ICD-10-CM | POA: Diagnosis not present

## 2023-02-12 DIAGNOSIS — L98499 Non-pressure chronic ulcer of skin of other sites with unspecified severity: Secondary | ICD-10-CM | POA: Diagnosis not present

## 2023-02-12 DIAGNOSIS — M009 Pyogenic arthritis, unspecified: Secondary | ICD-10-CM | POA: Diagnosis not present

## 2023-02-12 DIAGNOSIS — L03115 Cellulitis of right lower limb: Secondary | ICD-10-CM | POA: Diagnosis present

## 2023-02-12 DIAGNOSIS — E785 Hyperlipidemia, unspecified: Secondary | ICD-10-CM | POA: Diagnosis present

## 2023-02-12 DIAGNOSIS — E860 Dehydration: Secondary | ICD-10-CM | POA: Diagnosis not present

## 2023-02-12 DIAGNOSIS — Z560 Unemployment, unspecified: Secondary | ICD-10-CM

## 2023-02-12 DIAGNOSIS — R112 Nausea with vomiting, unspecified: Secondary | ICD-10-CM | POA: Diagnosis present

## 2023-02-12 DIAGNOSIS — Z7984 Long term (current) use of oral hypoglycemic drugs: Secondary | ICD-10-CM

## 2023-02-12 HISTORY — DX: Peripheral vascular disease, unspecified: I73.9

## 2023-02-12 HISTORY — DX: Local infection of the skin and subcutaneous tissue, unspecified: L08.9

## 2023-02-12 HISTORY — DX: Cellulitis of right lower limb: L02.415

## 2023-02-12 LAB — CBC WITH DIFFERENTIAL/PLATELET
Abs Immature Granulocytes: 0.1 10*3/uL — ABNORMAL HIGH (ref 0.00–0.07)
Basophils Absolute: 0 10*3/uL (ref 0.0–0.1)
Basophils Relative: 0 %
Eosinophils Absolute: 0.2 10*3/uL (ref 0.0–0.5)
Eosinophils Relative: 1 %
HCT: 35.4 % — ABNORMAL LOW (ref 39.0–52.0)
Hemoglobin: 11.7 g/dL — ABNORMAL LOW (ref 13.0–17.0)
Immature Granulocytes: 1 %
Lymphocytes Relative: 8 %
Lymphs Abs: 1.4 10*3/uL (ref 0.7–4.0)
MCH: 29.4 pg (ref 26.0–34.0)
MCHC: 33.1 g/dL (ref 30.0–36.0)
MCV: 88.9 fL (ref 80.0–100.0)
Monocytes Absolute: 1.1 10*3/uL — ABNORMAL HIGH (ref 0.1–1.0)
Monocytes Relative: 6 %
Neutro Abs: 15.2 10*3/uL — ABNORMAL HIGH (ref 1.7–7.7)
Neutrophils Relative %: 84 %
Platelets: 270 10*3/uL (ref 150–400)
RBC: 3.98 MIL/uL — ABNORMAL LOW (ref 4.22–5.81)
RDW: 12.7 % (ref 11.5–15.5)
WBC: 18 10*3/uL — ABNORMAL HIGH (ref 4.0–10.5)
nRBC: 0 % (ref 0.0–0.2)

## 2023-02-12 LAB — COMPREHENSIVE METABOLIC PANEL
ALT: 17 U/L (ref 0–44)
AST: 16 U/L (ref 15–41)
Albumin: 3.4 g/dL — ABNORMAL LOW (ref 3.5–5.0)
Alkaline Phosphatase: 73 U/L (ref 38–126)
Anion gap: 10 (ref 5–15)
BUN: 22 mg/dL — ABNORMAL HIGH (ref 6–20)
CO2: 23 mmol/L (ref 22–32)
Calcium: 8.6 mg/dL — ABNORMAL LOW (ref 8.9–10.3)
Chloride: 99 mmol/L (ref 98–111)
Creatinine, Ser: 1.5 mg/dL — ABNORMAL HIGH (ref 0.61–1.24)
GFR, Estimated: 55 mL/min — ABNORMAL LOW (ref >60)
Glucose, Bld: 372 mg/dL — ABNORMAL HIGH (ref 70–99)
Potassium: 4.1 mmol/L (ref 3.5–5.1)
Sodium: 132 mmol/L — ABNORMAL LOW (ref 135–145)
Total Bilirubin: 0.6 mg/dL (ref 0.3–1.2)
Total Protein: 7.2 g/dL (ref 6.5–8.1)

## 2023-02-12 LAB — GLUCOSE, CAPILLARY
Glucose-Capillary: 203 mg/dL — ABNORMAL HIGH (ref 70–99)
Glucose-Capillary: 199 mg/dL — ABNORMAL HIGH (ref 70–99)

## 2023-02-12 LAB — PROTIME-INR
INR: 1.2 (ref 0.8–1.2)
Prothrombin Time: 15.3 seconds — ABNORMAL HIGH (ref 11.4–15.2)

## 2023-02-12 LAB — C-REACTIVE PROTEIN: CRP: 24.9 mg/dL — ABNORMAL HIGH (ref <1.0)

## 2023-02-12 LAB — SEDIMENTATION RATE: Sed Rate: 64 mm/hr — ABNORMAL HIGH (ref 0–16)

## 2023-02-12 LAB — PREALBUMIN: Prealbumin: 16 mg/dL — ABNORMAL LOW (ref 18–38)

## 2023-02-12 LAB — LACTIC ACID, PLASMA: Lactic Acid, Venous: 2 mmol/L (ref 0.5–1.9)

## 2023-02-12 LAB — APTT: aPTT: 31 seconds (ref 24–36)

## 2023-02-12 MED ORDER — SIMVASTATIN 20 MG PO TABS
10.0000 mg | ORAL_TABLET | Freq: Every evening | ORAL | Status: DC
  Administered 2023-02-12 – 2023-02-17 (×6): 10 mg via ORAL
  Filled 2023-02-12 (×6): qty 1

## 2023-02-12 MED ORDER — OXYCODONE HCL 5 MG PO TABS: 5.0000 mg | ORAL_TABLET | ORAL | Status: DC | PRN

## 2023-02-12 MED ORDER — BISACODYL 5 MG PO TBEC: 5.0000 mg | DELAYED_RELEASE_TABLET | Freq: Every day | ORAL | Status: DC | PRN

## 2023-02-12 MED ORDER — METRONIDAZOLE 500 MG/100ML IV SOLN
500.0000 mg | Freq: Three times a day (TID) | INTRAVENOUS | Status: DC
Start: 2023-02-12 — End: 2023-02-12

## 2023-02-12 MED ORDER — MORPHINE SULFATE (PF) 2 MG/ML IV SOLN: 2.0000 mg | INTRAVENOUS | Status: DC | PRN

## 2023-02-12 MED ORDER — VANCOMYCIN HCL IN DEXTROSE 1-5 GM/200ML-% IV SOLN: 1000.0000 mg | Freq: Once | INTRAVENOUS | Status: DC

## 2023-02-12 MED ORDER — ACETAMINOPHEN 325 MG PO TABS
650.0000 mg | ORAL_TABLET | Freq: Four times a day (QID) | ORAL | Status: DC | PRN
  Administered 2023-02-13 – 2023-02-14 (×2): 650 mg via ORAL
  Filled 2023-02-12: qty 2

## 2023-02-12 MED ORDER — INSULIN ASPART 100 UNIT/ML IJ SOLN: 0.0000 [IU] | Freq: Every day | INTRAMUSCULAR | Status: DC

## 2023-02-12 MED ORDER — SODIUM CHLORIDE 0.9 % IV BOLUS (SEPSIS): 1000.0000 mL | Freq: Once | INTRAVENOUS | Status: DC

## 2023-02-12 MED ORDER — HYDRALAZINE HCL 20 MG/ML IJ SOLN: 5.0000 mg | INTRAMUSCULAR | Status: DC | PRN

## 2023-02-12 MED ORDER — ONDANSETRON HCL 4 MG/2ML IJ SOLN
4.0000 mg | Freq: Four times a day (QID) | INTRAMUSCULAR | Status: DC | PRN
  Administered 2023-02-14 – 2023-02-17 (×5): 4 mg via INTRAVENOUS
  Filled 2023-02-12 (×5): qty 2

## 2023-02-12 MED ORDER — SODIUM CHLORIDE 0.9 % IV SOLN
2.0000 g | INTRAVENOUS | Status: DC
  Administered 2023-02-13 – 2023-02-18 (×5): 2 g via INTRAVENOUS
  Filled 2023-02-12 (×5): qty 20

## 2023-02-12 MED ORDER — SODIUM CHLORIDE 0.9 % IV BOLUS (SEPSIS)
1000.0000 mL | Freq: Once | INTRAVENOUS | Status: AC
  Administered 2023-02-12: 1000 mL via INTRAVENOUS

## 2023-02-12 MED ORDER — SODIUM CHLORIDE 0.9 % IV SOLN
2.0000 g | Freq: Once | INTRAVENOUS | Status: AC
  Administered 2023-02-12: 2 g via INTRAVENOUS
  Filled 2023-02-12: qty 20

## 2023-02-12 MED ORDER — GADOBUTROL 1 MMOL/ML IV SOLN
8.0000 mL | Freq: Once | INTRAVENOUS | Status: AC | PRN
  Administered 2023-02-12: 8 mL via INTRAVENOUS

## 2023-02-12 MED ORDER — SODIUM CHLORIDE 0.9 % IV SOLN: INTRAVENOUS | Status: DC

## 2023-02-12 MED ORDER — INSULIN GLARGINE-YFGN 100 UNIT/ML ~~LOC~~ SOLN
20.0000 [IU] | Freq: Two times a day (BID) | SUBCUTANEOUS | Status: DC
  Administered 2023-02-12 – 2023-02-13 (×2): 20 [IU] via SUBCUTANEOUS
  Filled 2023-02-12 (×3): qty 0.2

## 2023-02-12 MED ORDER — ENOXAPARIN SODIUM 40 MG/0.4ML IJ SOSY
40.0000 mg | PREFILLED_SYRINGE | INTRAMUSCULAR | Status: DC
  Administered 2023-02-12 – 2023-02-17 (×6): 40 mg via SUBCUTANEOUS
  Filled 2023-02-12 (×6): qty 0.4

## 2023-02-12 MED ORDER — METRONIDAZOLE 500 MG/100ML IV SOLN
500.0000 mg | Freq: Two times a day (BID) | INTRAVENOUS | Status: DC
  Administered 2023-02-13 – 2023-02-14 (×2): 500 mg via INTRAVENOUS
  Filled 2023-02-12 (×3): qty 100

## 2023-02-12 MED ORDER — INSULIN ASPART 100 UNIT/ML IJ SOLN
0.0000 [IU] | Freq: Three times a day (TID) | INTRAMUSCULAR | Status: DC
  Administered 2023-02-12 – 2023-02-13 (×3): 5 [IU] via SUBCUTANEOUS
  Administered 2023-02-14 (×2): 3 [IU] via SUBCUTANEOUS
  Administered 2023-02-14: 2 [IU] via SUBCUTANEOUS
  Administered 2023-02-15: 5 [IU] via SUBCUTANEOUS
  Administered 2023-02-15: 3 [IU] via SUBCUTANEOUS
  Administered 2023-02-15: 2 [IU] via SUBCUTANEOUS
  Administered 2023-02-16 – 2023-02-17 (×2): 3 [IU] via SUBCUTANEOUS
  Administered 2023-02-17: 2 [IU] via SUBCUTANEOUS
  Administered 2023-02-17: 3 [IU] via SUBCUTANEOUS
  Administered 2023-02-18 (×2): 2 [IU] via SUBCUTANEOUS

## 2023-02-12 MED ORDER — VANCOMYCIN HCL 1500 MG/300ML IV SOLN
1500.0000 mg | INTRAVENOUS | Status: DC
  Administered 2023-02-13: 1500 mg via INTRAVENOUS
  Filled 2023-02-12: qty 300

## 2023-02-12 MED ORDER — ACETAMINOPHEN 650 MG RE SUPP: 650.0000 mg | Freq: Four times a day (QID) | RECTAL | Status: DC | PRN

## 2023-02-12 MED ORDER — POLYETHYLENE GLYCOL 3350 17 G PO PACK: 17.0000 g | PACK | Freq: Every day | ORAL | Status: DC | PRN

## 2023-02-12 MED ORDER — ONDANSETRON HCL 4 MG PO TABS
4.0000 mg | ORAL_TABLET | Freq: Four times a day (QID) | ORAL | Status: DC | PRN
  Administered 2023-02-17: 4 mg via ORAL
  Filled 2023-02-12: qty 1

## 2023-02-12 MED ORDER — DOCUSATE SODIUM 100 MG PO CAPS
100.0000 mg | ORAL_CAPSULE | Freq: Two times a day (BID) | ORAL | Status: DC
  Administered 2023-02-12 – 2023-02-18 (×7): 100 mg via ORAL
  Filled 2023-02-12 (×10): qty 1

## 2023-02-12 MED ORDER — VANCOMYCIN HCL 2000 MG/400ML IV SOLN
2000.0000 mg | Freq: Once | INTRAVENOUS | Status: AC
  Administered 2023-02-12: 2000 mg via INTRAVENOUS
  Filled 2023-02-12: qty 400

## 2023-02-12 MED ORDER — LACTATED RINGERS IV SOLN: INTRAVENOUS | Status: DC

## 2023-02-12 NOTE — Plan of Care (Signed)
  Problem: Health Behavior/Discharge Planning: Goal: Ability to manage health-related needs will improve Outcome: Progressing   Problem: Clinical Measurements: Goal: Ability to maintain clinical measurements within normal limits will improve Outcome: Progressing Goal: Will remain free from infection Outcome: Progressing Goal: Diagnostic test results will improve Outcome: Progressing   Problem: Activity: Goal: Risk for activity intolerance will decrease Outcome: Progressing   Problem: Pain Managment: Goal: General experience of comfort will improve Outcome: Progressing   Problem: Safety: Goal: Ability to remain free from injury will improve Outcome: Progressing   

## 2023-02-12 NOTE — Sepsis Progress Note (Signed)
Code Sepsis monitoring discontinued due to cancellation.  

## 2023-02-12 NOTE — ED Provider Notes (Signed)
EMERGENCY DEPARTMENT AT [facility] Provider Note   CSN: [id number] Arrival date & time: 02/12/23  1348     History  Chief Complaint  Patient presents with   Cellulitis    Douglas Murphy is a 57 y.o. male.  HPI   This patient is a 56 year old male who is an insulin requiring diabetic, he also has high blood pressure on lisinopril.  He presents with approximately 48 hours of worsening right foot redness with spreading redness up the leg, he is now feeling weak and presents hypotensive and tachycardic.  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  Home Medications Prior to Admission medications   Medication Sig Start Date End Date Taking? Authorizing Provider  insulin NPH-regular Human (NOVOLIN 70/30) (70-30) 100 UNIT/ML injection Inject 30 Units into the skin 2 (two) times daily.    [provider]  lidocaine (XYLOCAINE) 2 % solution Use as directed 10 mLs in the mouth or throat every 3 (three) hours as needed (throat pain). Swish and spit out- do not swallow 05/13/21   Sudie Grumbling, NP  lisinopril (PRINIVIL,ZESTRIL) 5 MG tablet Take 5 mg by mouth daily.    [provider]  metFORMIN (GLUCOPHAGE) 1000 MG tablet Take 1,000 mg by mouth 2 (two) times daily.    [provider]  metoCLOPramide (REGLAN) 10 MG tablet Take 1 tablet (10 mg total) by mouth every 6 (six) hours as needed for nausea or vomiting. 07/11/22   Gloris Manchester, MD      Allergies    Other    Review of Systems   Review of Systems  All other systems reviewed and are negative.   Physical Exam Updated Vital Signs BP 109/69   Pulse 88   Temp 97.6 F (36.4 C) (Oral)   Resp 17   Ht 1.829 m (6')   Wt 83.9 kg   SpO2 100%   BMI 25.09 kg/m  Physical Exam Vitals and nursing note reviewed.  Constitutional:      General: He is not in acute distress.    Appearance: He is well-developed.  HENT:     Head: Normocephalic and  atraumatic.     Mouth/Throat:     Pharynx: No oropharyngeal exudate.  Eyes:     General: No scleral icterus.       Right eye: No discharge.        Left eye: No discharge.     Conjunctiva/sclera: Conjunctivae normal.     Pupils: Pupils are equal, round, and reactive to light.  Neck:     Thyroid: No thyromegaly.     Vascular: No JVD.  Cardiovascular:     Rate and Rhythm: Regular rhythm. Tachycardia present.     Heart sounds: Normal heart sounds. No murmur heard.    No friction rub. No gallop.  Pulmonary:     Effort: Pulmonary effort is normal. No respiratory distress.     Breath sounds: Normal breath sounds. No wheezing or rales.  Abdominal:     General: Bowel sounds are normal. There is no distension.     Palpations: Abdomen is soft. There is no mass.     Tenderness: There is no abdominal tenderness.  Musculoskeletal:        General: Tenderness present. No swelling or signs of injury. Normal range of motion.     Cervical back: Normal range of motion and neck supple.     Right lower leg: No edema.  Left lower leg: No edema.  Lymphadenopathy:     Cervical: No cervical adenopathy.  Skin:    General: Skin is warm and dry.     Findings: Erythema and rash present.     Comments: Right foot over the lateral aspect spreading dorsally and plantar and extending up onto the leg with lymphangitic spread up to the knee  Neurological:     General: No focal deficit present.     Mental Status: He is alert.     Coordination: Coordination normal.     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     ED Results / Procedures / Treatments   Labs (all labs ordered are listed, but only abnormal results are displayed) Labs Reviewed  COMPREHENSIVE METABOLIC PANEL - Abnormal; Notable for the following components:      Result Value   Sodium 132 (*)    Glucose, Bld 372 (*)    BUN 22 (*)    Creatinine, Ser 1.50 (*)    Calcium 8.6 (*)    Albumin  3.4 (*)    GFR, Estimated 55 (*)    All other components within normal limits  CBC WITH DIFFERENTIAL/PLATELET - Abnormal; Notable for the following components:   WBC 18.0 (*)    RBC 3.98 (*)    Hemoglobin 11.7 (*)    HCT 35.4 (*)    Neutro Abs 15.2 (*)    Monocytes Absolute 1.1 (*)    Abs Immature Granulocytes 0.10 (*)    All other components within normal limits  LACTIC ACID, PLASMA - Abnormal; Notable for the following components:   Lactic Acid, Venous 2.0 (*)    All other components within normal limits  CULTURE, BLOOD (ROUTINE X 2)  CULTURE, BLOOD (ROUTINE X 2)  RESP PANEL BY RT-PCR (RSV, FLU A&B, COVID)  RVPGX2  URINE CULTURE  PROTIME-INR  APTT  URINALYSIS, ROUTINE W REFLEX MICROSCOPIC  HEMOGLOBIN A1C    EKG EKG Interpretation  Date/Time:  Saturday February 12 2023 15:03:42 EDT Ventricular Rate:  91 PR Interval:  166 QRS Duration: 89 QT Interval:  343 QTC Calculation: 422 R Axis:   80 Text Interpretation: Sinus rhythm Normal ECG Confirmed by Eber Hong ([id number]) on 02/12/2023 3:12:15 PM  Radiology DG Foot Complete Right  Result Date: 02/12/2023 CLINICAL DATA:  Infection EXAM: RIGHT FOOT COMPLETE - 3 VIEW COMPARISON:  None Available. FINDINGS: No evidence of fracture or dislocation. No osseous destruction. Mild degenerative changes of the IP joint of the great toe. Soft tissue edema of the lateral forefoot with associated soft tissue ulceration and tiny locule of soft tissue gas. Vascular calcifications. IMPRESSION: 1. Soft tissue edema of the lateral forefoot with associated soft tissue ulceration and tiny locule of soft tissue gas, concerning for infection with gas-forming organism. 2. No radiographic evidence of osteomyelitis. Electronically Signed   By: Allegra Lai M.D.   On: 02/12/2023 15:32    Procedures .Critical Care  Performed by: Eber Hong, MD Authorized by: Eber Hong, MD   Critical care provider statement:    Critical care time (minutes):  45    Critical care time was exclusive of:  Separately billable procedures and treating other patients and teaching time   Critical care was necessary to treat or prevent imminent or life-threatening deterioration of the following conditions:  Sepsis   Critical care was time spent personally by me on the following activities:  Development of treatment plan with patient  or surrogate, discussions with consultants, evaluation of patient's response to treatment, examination of patient, obtaining history from patient or surrogate, review of old charts, re-evaluation of patient's condition, pulse oximetry, ordering and review of radiographic studies, ordering and review of laboratory studies and ordering and performing treatments and interventions   Care discussed with: admitting provider   Comments:           Medications Ordered in ED Medications  lactated ringers infusion (has no administration in time range)  sodium chloride 0.9 % bolus 1,000 mL (has no administration in time range)    And  sodium chloride 0.9 % bolus 1,000 mL (1,000 mLs Intravenous New Bag/Given 02/12/23 1532)    And  sodium chloride 0.9 % bolus 1,000 mL (has no administration in time range)  vancomycin (VANCOREADY) IVPB 2000 mg/400 mL (has no administration in time range)  vancomycin (VANCOREADY) IVPB 1500 mg/300 mL (has no administration in time range)  cefTRIAXone (ROCEPHIN) 2 g in sodium chloride 0.9 % 100 mL IVPB (0 g Intravenous Stopped 02/12/23 1607)    ED Course/ Medical Decision Making/ A&P                             Medical Decision Making Amount and/or Complexity of Data Reviewed Labs: ordered. Radiology: ordered. ECG/medicine tests: ordered.  Risk Prescription drug management. Decision regarding hospitalization.    This patient presents to the ED for concern of spreading redness, this involves an extensive number of treatment options, and is a complaint that carries with it a high risk of complications and  morbidity.  The differential diagnosis includes cellulitis, sepsis, deep tissue infection, osteomyelitis   Co morbidities that complicate the patient evaluation  Diabetic   Additional history obtained:  Additional history obtained from patient and medical record External records from outside source obtained and reviewed including prior amputation of toe   Lab Tests:  I Ordered, and personally interpreted labs.  The pertinent results include: Abnormal labs including a leukocytosis of 18,000, he has normal hemoglobin and platelets, he has a neutrophil predominance.  He has a new renal insufficiency with a creatinine of 1.5 compared to a baseline of 1.2, otherwise electrolytes are fairly normal.  Lactic acid measured at 2.0   Imaging Studies ordered:  I ordered imaging studies including x-ray of the foot I independently visualized and interpreted imaging which showed soft tissue infection and a small amount of gas, no signs of necrotizing fasciitis, no signs of osteomyelitis I agree with the radiologist interpretation   Cardiac Monitoring: / EKG:  The patient was maintained on a cardiac monitor.  I personally viewed and interpreted the cardiac monitored which showed an underlying rhythm of: Sinus tachycardia   Consultations Obtained:  I requested consultation with the hospitalist,  and discussed lab and imaging findings as well as pertinent plan - they recommend: Admit   Problem List / ED Course / Critical interventions / Medication management  Due to the patient's hypotension with a blood pressure less than 100 systolic and a leukocytosis, the decision was made to aggressively resuscitate with IV fluids and antibiotics I ordered medication including daily at 30 cc/kg, Rocephin and vancomycin in case this is a purulent infection for sepsis and deep tissue infection Hyperglycemia - poorly controlled based on CBG - will check AIc - likely component of  immunosuppression. Reevaluation of the patient after these medicines showed that the patient critically ill but improving I have reviewed the patients  home medicines and have made adjustments as needed   Social Determinants of Health:  Type 1 diabetes, prior amputation   Test / Admission - Considered:  Will admit to the hospital, higher level of care         Final Clinical Impression(s) / ED Diagnoses Final diagnoses:  Sepsis with acute renal failure without septic shock, due to unspecified organism, unspecified acute renal failure type (HCC)  Cellulitis of right lower extremity     Eber Hong, MD 02/12/23 1615

## 2023-02-12 NOTE — Sepsis Progress Note (Signed)
Elink following code sepsis °

## 2023-02-12 NOTE — ED Triage Notes (Signed)
Pt came to ED for right toe infection. The entire foot and is red and painful for pt. Pt has open wound. Axox4. Hx of diabetes. 

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
## 2023-02-12 NOTE — ED Notes (Signed)
edh     

## 2023-02-12 NOTE — Progress Notes (Signed)
Pharmacy Antibiotic Note  Douglas Murphy is a 57 y.o. male admitted on 02/12/2023 with sepsis/cellulitis of big toe.  Pharmacy has been consulted for vancomycin dosing. Patient presented with a right toe infection, open wound that is red and painful. 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 sCr 1.5 (bl~1.2), WBC 18, ANC 15, lactate 2.0, and afebrile.   Plan: Vancomycin 2g IV x1 dose Vancomycin 1500mg  IV every 24 hours. (AUC 445, Vd 0.72, sCr 1.5, Wt 84kg) Ceftriaxone 2g IV every 24 hours dosed per MD Follow WBC, fever curve, clinical status of patient, Bcx/Ucx  Height: 6' (182.9 cm) Weight: 83.9 kg (185 lb) IBW/kg (Calculated) : 77.6  Temp (24hrs), Avg:97.6 F (36.4 C), Min:97.6 F (36.4 C), Max:97.6 F (36.4 C)  Recent Labs  Lab 02/12/23 1355  WBC 18.0*  CREATININE 1.50*  LATICACIDVEN 2.0*    Estimated Creatinine Clearance: 61.8 mL/min (A) (by C-G formula based on SCr of 1.5 mg/dL (H)).    Allergies  Allergen Reactions   Other Anaphylaxis    [place] nuts     Antimicrobials this admission: Ceftriaxone 6/1 >>  Vancomycin 6/1 >>   Microbiology results: 6/1 BCx:  6/1 UCx:    Thank you for allowing pharmacy to be a part of this patient's care.  Arabella Merles, PharmD. [facility] Acute Care PGY-1 02/12/2023 3:22 PM
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
## 2023-02-12 NOTE — ED Notes (Signed)
Second set of cultures collected at 15:25 before antibiotics.

## 2023-02-13 ENCOUNTER — Inpatient Hospital Stay (HOSPITAL_COMMUNITY): Payer: 59

## 2023-02-13 DIAGNOSIS — L98499 Non-pressure chronic ulcer of skin of other sites with unspecified severity: Secondary | ICD-10-CM | POA: Diagnosis not present

## 2023-02-13 DIAGNOSIS — L03115 Cellulitis of right lower limb: Secondary | ICD-10-CM

## 2023-02-13 LAB — BASIC METABOLIC PANEL
Anion gap: 9 (ref 5–15)
BUN: 18 mg/dL (ref 6–20)
CO2: 21 mmol/L — ABNORMAL LOW (ref 22–32)
Calcium: 8.1 mg/dL — ABNORMAL LOW (ref 8.9–10.3)
Chloride: 105 mmol/L (ref 98–111)
Creatinine, Ser: 1.1 mg/dL (ref 0.61–1.24)
GFR, Estimated: >60 mL/min (ref >60)
Glucose, Bld: 201 mg/dL — ABNORMAL HIGH (ref 70–99)
Potassium: 3.6 mmol/L (ref 3.5–5.1)
Sodium: 135 mmol/L (ref 135–145)

## 2023-02-13 LAB — CULTURE, BLOOD (ROUTINE X 2)

## 2023-02-13 LAB — GLUCOSE, CAPILLARY
Glucose-Capillary: 204 mg/dL — ABNORMAL HIGH (ref 70–99)
Glucose-Capillary: 226 mg/dL — ABNORMAL HIGH (ref 70–99)
Glucose-Capillary: 70 mg/dL (ref 70–99)
Glucose-Capillary: 85 mg/dL (ref 70–99)
Glucose-Capillary: 170 mg/dL — ABNORMAL HIGH (ref 70–99)

## 2023-02-13 LAB — CBC
HCT: 32.1 % — ABNORMAL LOW (ref 39.0–52.0)
Hemoglobin: 10.4 g/dL — ABNORMAL LOW (ref 13.0–17.0)
MCH: 29 pg (ref 26.0–34.0)
MCHC: 32.4 g/dL (ref 30.0–36.0)
MCV: 89.4 fL (ref 80.0–100.0)
Platelets: 255 10*3/uL (ref 150–400)
RBC: 3.59 MIL/uL — ABNORMAL LOW (ref 4.22–5.81)
RDW: 12.9 % (ref 11.5–15.5)
WBC: 16.8 10*3/uL — ABNORMAL HIGH (ref 4.0–10.5)
nRBC: 0 % (ref 0.0–0.2)

## 2023-02-13 LAB — VAS US ABI WITH/WO TBI
Left ABI: 1.36
Right ABI: 1.42

## 2023-02-13 MED ORDER — INSULIN GLARGINE-YFGN 100 UNIT/ML ~~LOC~~ SOLN
15.0000 [IU] | Freq: Two times a day (BID) | SUBCUTANEOUS | Status: DC
  Administered 2023-02-13 – 2023-02-18 (×9): 15 [IU] via SUBCUTANEOUS
  Filled 2023-02-13 (×12): qty 0.15

## 2023-02-13 MED ORDER — INSULIN ASPART 100 UNIT/ML IJ SOLN
3.0000 [IU] | Freq: Three times a day (TID) | INTRAMUSCULAR | Status: DC
  Administered 2023-02-13 – 2023-02-18 (×9): 3 [IU] via SUBCUTANEOUS

## 2023-02-13 NOTE — Progress Notes (Signed)
VASCULAR LAB    ABIs have been performed.  See CV proc for preliminary results.   Khayman Kirsch, RVT 02/13/2023, 10:18 AM

## 2023-02-13 NOTE — Progress Notes (Addendum)
PROGRESS NOTE   Douglas Murphy  ZOX:[id number]    DOB: 09/12/1966    DOA: 02/12/2023  PCP: [name], MD   I have briefly reviewed patients previous medical records in [facility].  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  Admitted for right foot ulcer with cellulitis and possible phlegmon versus abscess of right fifth toe.  Orthopedics/Dr. Roda Shutters has been consulted.  He will have Dr. Lajoyce Corners evaluate patient on 6/3.   Assessment & Plan:  Principal Problem:   Diabetic foot infection (HCC) Active Problems:   Diabetes mellitus (HCC)   Acute kidney injury (HCC)   Cellulitis and abscess of right lower extremity   Essential hypertension   Dyslipidemia   Diabetic foot infection/right foot ulcer with cellulitis and possible phlegmon versus abscess of right fifth toe: Complicating poorly controlled DM with peripheral neuropathy. Prior history of left first toe amputation and has a small linear uncomplicated ulcer on the ball of the left foot at MTP. Sepsis ruled out on admission. Has bounding bilateral dorsalis pedis pulsations and hence suspect ABIs to be unremarkable. MRI of right forefoot: Soft tissue wound or ulceration at the lateral aspect of the forefoot at the fifth MTP joint level.  Marked surrounding soft tissue swelling and edema.  Somewhat ill-defined fluid and air collection surrounding the fifth MTP joint measuring approximately 3.3 x 2.7 x 3 cm, suggesting phlegmon or developing abscess.  Septic arthritis of fifth MTP joint not excluded.  No osteomyelitis. CRP 24.9, ESR 64, prealbumin 16. Continue  empirically started IV ceftriaxone, metronidazole and vancomycin. Orthopedics/Dr. Roda Shutters consulted and will have Dr. Lajoyce Corners see him on 6/3. WOC team input appreciated.   Poorly controlled type II DM with peripheral neuropathy: Most recent A1c in [facility] was 9 in January 2019.  Patient reports that in mid May, A1c at his PCPs office may have been in the 10-11 range and states that they were discussing of trying to get it closer to 7. Reports checking CBGs twice a day with average readings about 180. Claims compliance with home Novolin 70/30, 30 units twice a day and metformin 1 g twice daily.  Holding home meds for now. Repeat A1c pending.  DM coordinator consulted. Continue current Semglee 20 units twice daily, NovoLog moderate SSI.  Will add NovoLog mealtime 3 units 3 times daily.  Monitor closely and adjust insulins as needed.  Acute kidney injury: Suspect due to dehydration and infection.  Resolved after IV fluids.  Discontinued IV fluids.  Hyperlipidemia: Continue simvastatin.  Hypertension: Controlled.  Hold lisinopril and appears that he had not been taking it recently because his MD had it on hold.  Normocytic anemia: May be anemia of chronic disease but will need further evaluation as outpatient.   Body mass index is 25.47 kg/m.   ACP Documents: None DVT prophylaxis: enoxaparin (LOVENOX) injection 40 mg Start: 02/12/23 2200     Code Status: Full Code:  Family Communication: None at bedside Disposition:  Status is: Inpatient Remains inpatient appropriate because: IV antibiotics, evaluation by orthopedics and may need surgical intervention.     Consultants:   Orthopedics  Procedures:  Antimicrobials:   As above   Subjective:  Reports that pain, swelling and redness of right foot are better.  Cannot remember drainage from wound of right foot.  States that pain that went along the medial aspect of his right leg has resolved.  Rest of history as noted  above.  Objective:   Vitals:   02/12/23 2252 02/13/23 0025 02/13/23 0412 02/13/23 0814  BP:  107/71 118/78 126/74  Pulse:  91 91 96  Resp:  16 16 18   Temp: 99 F (37.2 C) 98.8 F (37.1 C) 98.6 F (37 C) 98 F (36.7 C)  TempSrc: Oral     SpO2:  97% 98% 100%  Weight:      Height:        General exam: Young male, moderately built and nourished lying comfortably propped up in bed without distress. Respiratory system: Clear to auscultation. Respiratory effort normal. Cardiovascular system: S1 & S2 heard, RRR. No JVD, murmurs, rubs, gallops or clicks. No pedal edema. Gastrointestinal system: Abdomen is nondistended, soft and nontender. No organomegaly or masses felt. Normal bowel sounds heard. Central nervous system: Alert and oriented. No focal neurological deficits. Extremities: Symmetric 5 x 5 power.  Left great toe amputation site healed well.  Small linear ulcer without acute findings over the ball of the left MTP joint. Ulceration over lateral aspect of right fifth metatarsal head with crusting, surrounding erythema and edema of dorsum of the right foot but the streaking erythema noted on admission up the medial right leg has resolved.  Bound in bilateral dorsalis pedis pulsations. Skin: No rashes, lesions or ulcers Psychiatry: Judgement and insight appear normal. Mood & affect appropriate.    R foot 02/12/23   R foot 02/12/23   L foot 02/12/23      Data Reviewed:   I have personally reviewed following labs and imaging studies   CBC: Recent Labs  Lab 02/12/23 1355 02/13/23 0200  WBC 18.0* 16.8*  NEUTROABS 15.2*  --   HGB 11.7* 10.4*  HCT 35.4* 32.1*  MCV 88.9 89.4  PLT 270 255    Basic Metabolic Panel: Recent Labs  Lab 02/12/23 1355 02/13/23 0200  NA 132* 135  K 4.1 3.6  CL 99 105  CO2 23 21*  GLUCOSE 372* 201*  BUN 22* 18  CREATININE 1.50* 1.10  CALCIUM 8.6* 8.1*    Liver Function Tests: Recent Labs  Lab 02/12/23 1355  AST 16  ALT 17  ALKPHOS  73  BILITOT 0.6  PROT 7.2  ALBUMIN 3.4*    CBG: Recent Labs  Lab 02/12/23 1737 02/12/23 2039 02/13/23 0815  GLUCAP 203* 199* 204*    Microbiology Studies:  No results found for this or any previous visit (from the past 240 hour(s)).  Radiology Studies:  MR FOOT LEFT W WO CONTRAST  Result Date: 02/13/2023 CLINICAL DATA:  Foot swelling, diabetic, osteomyelitis suspected, no prior imaging EXAM: MRI OF THE LEFT FOREFOOT WITHOUT AND WITH CONTRAST TECHNIQUE: Multiplanar, multisequence MR imaging of the left forefoot was performed both before and after administration of intravenous contrast. CONTRAST:  8mL GADAVIST GADOBUTROL 1 MMOL/ML IV SOLN COMPARISON:  X-ray 05/31/2021 FINDINGS: Bones/Joint/Cartilage Prior amputation of the left great toe at the first MTP joint level. Remaining osseous structures are intact. No fracture or dislocation. No bone marrow edema or periostitis. No erosions. No bone lesion. Degenerative changes of the hallux sesamoid complex. Elsewhere, no significant arthropathy. No sizable joint effusion Ligaments Intact Lisfranc ligament. Capsular thickening of the second MTP  joint. No collateral ligament disruption. Muscles and Tendons Mild edema-like signal of the foot musculature favors denervation changes. No tenosynovitis. Soft tissues There is some skin thickening the plantar aspect of the medial forefoot. No soft tissue edema or fluid collection. IMPRESSION: LEFT FOREFOOT: 1. No acute bony abnormality. Specifically, no evidence of acute osteomyelitis. 2. Prior amputation of the left great toe at the first MTP joint level. 3. Degenerative changes at the hallux-sesamoid complex. Electronically Signed   By: Duanne Guess D.O.   On: 02/13/2023 09:28   MR FOOT RIGHT W WO CONTRAST  Result Date: 02/13/2023 CLINICAL DATA:  Foot swelling, diabetic, osteomyelitis suspected, no prior imaging EXAM: MRI OF THE RIGHT FOREFOOT WITHOUT AND WITH CONTRAST TECHNIQUE: Multiplanar,  multisequence MR imaging of the right forefoot was performed before and after the administration of intravenous contrast. CONTRAST:  8mL GADAVIST GADOBUTROL 1 MMOL/ML IV SOLN COMPARISON:  X-ray 02/12/2023 FINDINGS: Bones/Joint/Cartilage No acute fracture. No dislocation. No erosion. No bone marrow edema or marrow replacement. Joint spaces are relatively well preserved. Trace fifth MTP joint effusion. Ligaments Intact Lisfranc ligament.  Intact collateral ligaments. Muscles and Tendons Diffuse edema-like signal of the foot musculature which may represent changes related to denervation and/or myositis. Intact flexor and extensor tendons. No well-defined to the synovial fluid collection. Soft tissues Soft tissue wound or ulceration at the lateral aspect of the forefoot at the 5th MTP joint level. Marked surrounding soft tissue swelling and edema. Somewhat ill-defined fluid collection surrounding the 5th MTP joint measuring approximately 3.3 x 2.7 x 3.0 cm (series 6, image 30), suggesting phlegmon or developing abscess. There are several foci of susceptibility artifact within this region compatible with foci of soft tissue gas, as seen radiographically. Extensive subcutaneous edema throughout the dorsum of the foot. No additional collections. IMPRESSION: RIGHT FOREFOOT: 1. Soft tissue wound or ulceration at the lateral aspect of the forefoot at the 5th MTP joint level. Marked surrounding soft tissue swelling and edema. Somewhat ill-defined fluid and air collection surrounding the 5th MTP joint measuring approximately 3.3 x 2.7 x 3.0 cm, suggesting phlegmon or developing abscess. 2. Trace fifth MTP joint effusion. Septic arthritis not excluded. 3. No evidence of osteomyelitis. 4. Diffuse edema-like signal of the foot musculature which may represent changes related to denervation and/or myositis. Electronically Signed   By: Duanne Guess D.O.   On: 02/13/2023 09:22   DG Foot Complete Right  Result Date:  02/12/2023 CLINICAL DATA:  Infection EXAM: RIGHT FOOT COMPLETE - 3 VIEW COMPARISON:  None Available. FINDINGS: No evidence of fracture or dislocation. No osseous destruction. Mild degenerative changes of the IP joint of the great toe. Soft tissue edema of the lateral forefoot with associated soft tissue ulceration and tiny locule of soft tissue gas. Vascular calcifications. IMPRESSION: 1. Soft tissue edema of the lateral forefoot with associated soft tissue ulceration and tiny locule of soft tissue gas, concerning for infection with gas-forming organism. 2. No radiographic evidence of osteomyelitis. Electronically Signed   By: Allegra Lai M.D.   On: 02/12/2023 15:32    Scheduled Meds:    docusate sodium  100 mg Oral BID   enoxaparin (LOVENOX) injection  40 mg Subcutaneous Q24H   insulin aspart  0-15 Units Subcutaneous TID WC   insulin aspart  0-5 Units Subcutaneous QHS   insulin glargine-yfgn  20 Units Subcutaneous BID   simvastatin  10 mg Oral QPM    Continuous Infusions:    sodium chloride 75 mL/hr at 02/12/23 1743   cefTRIAXone (  ROCEPHIN)  IV     metronidazole     vancomycin       LOS: 1 day     Marcellus Scott, MD,  FACP, [facility], [facility], [facility], [facility]   [facility] [place]     To contact the attending provider between 7A-7P or the covering provider during after hours 7P-7A, please log into the web site [email or web address] and access using universal [place] password for that web site. If you do not have the password, please call the hospital operator.  02/13/2023, 9:42 AM

## 2023-02-13 NOTE — Consult Note (Signed)
WOC Nurse Consult Note: Reason for Consult: full thickness wound to right lateral foot and left medial foot. R>L.  Patient with poorly controlled diabetes. Does not wear offloading orthotic or protective shoewear at home.  See photos provided by EDP and uploaded to EMR. Wound type:trauma in patient with neuropathy, infection Pressure Injury POA: Yes/No/NA Measurement:To be obtained by bedside RN today with application of next dressing Wound bed:red, dry Drainage (amount, consistency, odor) scant serous Periwound:erythematous (right LE), edematous, bilaterally  Dressing procedure/placement/frequency: I have provided Nursing with conservative care recommendations via the Nursing Orders for a soap and water cleanse to both feet followed by placement of betadine dressings twice daily to the affected sites of injury. If bottle of povidone-iodine not available, Nursing can instead paint the areas with a povidone-iodine swabstick and allow to air dry prior to placing dressings and securing. Heels are to be floated. Systemic antibiotics have been initiated.  Recommend consultation with orthopedics; patient has seen Dr Magnus Ivan in the past (09/25/2016) for amputation of the left great digit. Patient would benefit from follow up with Podiatric Medicine/Podiatry for offloading/protective shoewear or orthotics and routine follow up evaluations of his feet; he remains at high risk for further surgeries. Also recommend TOC as patient has expressed financial stressors that have impacted care decisions. If you agree, please order/consults.  Any wound care orders placed by a Provider will supercede those I have provided today.  WOC nursing team will not follow, but will remain available to this patient, the nursing and medical teams.  Please re-consult if needed.  Thank you for inviting [place] to participate in this patient's Plan of Care.  Ladona Mow, MSN, RN, CNS, GNP, [facility], [employer], [employer]  phone:  [phone]

## 2023-02-13 NOTE — Progress Notes (Signed)
Consult received from Dr. Waymon Amato.  I have reviewed imaging.  I have reached out to Dr. Lajoyce Corners about seeing him this coming week.  Patient may have diet today.

## 2023-02-14 DIAGNOSIS — L089 Local infection of the skin and subcutaneous tissue, unspecified: Secondary | ICD-10-CM

## 2023-02-14 DIAGNOSIS — E11628 Type 2 diabetes mellitus with other skin complications: Principal | ICD-10-CM

## 2023-02-14 DIAGNOSIS — L03115 Cellulitis of right lower limb: Secondary | ICD-10-CM

## 2023-02-14 DIAGNOSIS — L02612 Cutaneous abscess of left foot: Secondary | ICD-10-CM | POA: Diagnosis not present

## 2023-02-14 DIAGNOSIS — L02611 Cutaneous abscess of right foot: Secondary | ICD-10-CM | POA: Diagnosis not present

## 2023-02-14 HISTORY — DX: Cutaneous abscess of left foot: L02.612

## 2023-02-14 LAB — GLUCOSE, CAPILLARY
Glucose-Capillary: 149 mg/dL — ABNORMAL HIGH (ref 70–99)
Glucose-Capillary: 185 mg/dL — ABNORMAL HIGH (ref 70–99)
Glucose-Capillary: 151 mg/dL — ABNORMAL HIGH (ref 70–99)
Glucose-Capillary: 136 mg/dL — ABNORMAL HIGH (ref 70–99)
Glucose-Capillary: 126 mg/dL — ABNORMAL HIGH (ref 70–99)

## 2023-02-14 LAB — BASIC METABOLIC PANEL
Anion gap: 10 (ref 5–15)
BUN: 7 mg/dL (ref 6–20)
CO2: 23 mmol/L (ref 22–32)
Calcium: 8.2 mg/dL — ABNORMAL LOW (ref 8.9–10.3)
Chloride: 101 mmol/L (ref 98–111)
Creatinine, Ser: 0.89 mg/dL (ref 0.61–1.24)
GFR, Estimated: >60 mL/min (ref >60)
Glucose, Bld: 146 mg/dL — ABNORMAL HIGH (ref 70–99)
Potassium: 3.8 mmol/L (ref 3.5–5.1)
Sodium: 134 mmol/L — ABNORMAL LOW (ref 135–145)

## 2023-02-14 LAB — CBC
HCT: 30.1 % — ABNORMAL LOW (ref 39.0–52.0)
Hemoglobin: 10 g/dL — ABNORMAL LOW (ref 13.0–17.0)
MCH: 29.2 pg (ref 26.0–34.0)
MCHC: 33.2 g/dL (ref 30.0–36.0)
MCV: 87.8 fL (ref 80.0–100.0)
Platelets: 292 10*3/uL (ref 150–400)
RBC: 3.43 MIL/uL — ABNORMAL LOW (ref 4.22–5.81)
RDW: 12.8 % (ref 11.5–15.5)
WBC: 16.9 10*3/uL — ABNORMAL HIGH (ref 4.0–10.5)
nRBC: 0 % (ref 0.0–0.2)

## 2023-02-14 LAB — CULTURE, BLOOD (ROUTINE X 2)

## 2023-02-14 LAB — HEMOGLOBIN A1C
Hgb A1c MFr Bld: 11.3 % — ABNORMAL HIGH (ref 4.8–5.6)
Mean Plasma Glucose: 278 mg/dL

## 2023-02-14 MED ORDER — ADULT MULTIVITAMIN W/MINERALS CH
1.0000 | ORAL_TABLET | Freq: Every day | ORAL | Status: DC
  Administered 2023-02-15 – 2023-02-18 (×3): 1 via ORAL
  Filled 2023-02-14 (×4): qty 1

## 2023-02-14 MED ORDER — PANTOPRAZOLE SODIUM 40 MG IV SOLR
40.0000 mg | INTRAVENOUS | Status: DC
  Administered 2023-02-14 – 2023-02-15 (×2): 40 mg via INTRAVENOUS
  Filled 2023-02-14 (×2): qty 10

## 2023-02-14 MED ORDER — ZINC SULFATE 220 (50 ZN) MG PO CAPS
220.0000 mg | ORAL_CAPSULE | Freq: Every day | ORAL | Status: DC
  Administered 2023-02-15 – 2023-02-18 (×3): 220 mg via ORAL
  Filled 2023-02-14 (×4): qty 1

## 2023-02-14 MED ORDER — MELATONIN 3 MG PO TABS
3.0000 mg | ORAL_TABLET | Freq: Every day | ORAL | Status: DC
  Administered 2023-02-14 – 2023-02-17 (×4): 3 mg via ORAL
  Filled 2023-02-14 (×4): qty 1

## 2023-02-14 MED ORDER — ENSURE ENLIVE PO LIQD
237.0000 mL | Freq: Two times a day (BID) | ORAL | Status: DC
  Administered 2023-02-15 – 2023-02-18 (×5): 237 mL via ORAL

## 2023-02-14 MED ORDER — VANCOMYCIN HCL 1500 MG/300ML IV SOLN
1500.0000 mg | Freq: Two times a day (BID) | INTRAVENOUS | Status: DC
  Administered 2023-02-14 – 2023-02-18 (×9): 1500 mg via INTRAVENOUS
  Filled 2023-02-14 (×9): qty 300

## 2023-02-14 MED ORDER — SCOPOLAMINE 1 MG/3DAYS TD PT72
1.0000 | MEDICATED_PATCH | TRANSDERMAL | Status: DC
  Administered 2023-02-14 – 2023-02-17 (×2): 1.5 mg via TRANSDERMAL
  Filled 2023-02-14 (×2): qty 1

## 2023-02-14 MED ORDER — VITAMIN C 500 MG PO TABS
500.0000 mg | ORAL_TABLET | Freq: Every day | ORAL | Status: DC
  Administered 2023-02-15 – 2023-02-18 (×3): 500 mg via ORAL
  Filled 2023-02-14 (×4): qty 1

## 2023-02-14 MED ORDER — SODIUM CHLORIDE 0.9 % IV SOLN
12.5000 mg | Freq: Four times a day (QID) | INTRAVENOUS | Status: DC | PRN
  Administered 2023-02-14 – 2023-02-18 (×7): 12.5 mg via INTRAVENOUS
  Filled 2023-02-14 (×4): qty 0.5
  Filled 2023-02-14 (×3): qty 12.5
  Filled 2023-02-14: qty 0.5

## 2023-02-14 NOTE — Plan of Care (Signed)
  Problem: Education: Goal: Knowledge of General Education information will improve Description: Including pain rating scale, medication(s)/side effects and non-pharmacologic comfort measures Outcome: Progressing   Problem: Clinical Measurements: Goal: Will remain free from infection Outcome: Progressing Goal: Diagnostic test results will improve Outcome: Progressing   Problem: Activity: Goal: Risk for activity intolerance will decrease Outcome: Progressing   Problem: Safety: Goal: Ability to remain free from injury will improve Outcome: Progressing   Problem: Skin Integrity: Goal: Risk for impaired skin integrity will decrease Outcome: Progressing   
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
## 2023-02-14 NOTE — Progress Notes (Signed)
PROGRESS NOTE   Douglas Murphy  GYI:[id number]    DOB: September 09, 1966    DOA: 02/12/2023  PCP: [name], MD   I have briefly reviewed patients previous medical records in [facility].  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  Admitted for right foot ulcer with cellulitis and possible phlegmon versus abscess of right fifth toe.  Orthopedics, Dr. Lajoyce Corners evaluated 6/3 and plans surgery/right fifth toe amputation on 6/5.   Assessment & Plan:  Principal Problem:   Diabetic foot infection (HCC) Active Problems:   Diabetes mellitus (HCC)   Acute kidney injury (HCC)   Cellulitis and abscess of right lower extremity   Essential hypertension   Dyslipidemia   Diabetic foot infection/right foot ulcer with cellulitis and possible phlegmon versus abscess of right fifth toe: Complicating poorly controlled DM with peripheral neuropathy. Prior history of left first toe amputation and has a small linear uncomplicated ulcer on the ball of the left foot at MTP. Sepsis ruled out on admission. Has bounding bilateral dorsalis pedis pulsations and hence suspect ABIs to be unremarkable. MRI of right forefoot: Soft tissue wound or ulceration at the lateral aspect of the forefoot at the fifth MTP joint level.  Marked surrounding soft tissue swelling and edema.  Somewhat ill-defined fluid and air collection surrounding the fifth MTP joint measuring approximately 3.3 x 2.7 x 3 cm, suggesting phlegmon or developing abscess.  Septic arthritis of fifth MTP joint not excluded.  No osteomyelitis. CRP 24.9, ESR 64, prealbumin 16.  Blood  cultures x 2: NTD.  Bilateral ABIs normal. Continue empirically started IV ceftriaxone, metronidazole and vancomycin. WOC team input appreciated. Orthopedics, Dr. Lajoyce Corners evaluated 6/3 and plans surgery/right fifth toe amputation on 6/5.  He advised that right toe is not salvageable.  He also did bedside some deroofing of the wound for abscess drainage until upcoming surgery.  Poorly controlled type II DM with peripheral neuropathy: Most recent A1c in [facility] was 9 in January 2019.  Patient reports that in mid May, A1c at his PCPs office may have been in the 10-11 range and states that they were discussing of trying to get it closer to 7. Reports checking CBGs twice a day with average readings about 180. Claims compliance with home Novolin 70/30, 30 units twice a day and metformin 1 g twice daily.  Holding home meds for now. Repeat A1c pending.  DM coordinator consulted. Continue current Semglee 15 units twice daily, NovoLog moderate SSI.  Continue NovoLog mealtime 3 units 3 times daily.  Monitor closely and adjust insulins as needed. CBGs were tightly controlled last evening in the 70-85 range and Semglee was reduced from 20 units to 15 units twice daily.  Acute kidney injury: Suspect due to dehydration and infection.  Resolved after IV fluids.  Discontinued IV fluids.  Hyperlipidemia: Continue simvastatin.  Hypertension: Controlled.  Hold lisinopril and appears that he had not been taking it recently because his MD had it on hold.  Normocytic anemia: May be anemia of chronic disease but will need further evaluation as outpatient.   Body mass index is 25.47 kg/m.   ACP Documents: None DVT prophylaxis: enoxaparin (  LOVENOX) injection 40 mg Start: 02/12/23 2200     Code Status: Full Code:  Family Communication: None at bedside Disposition:  Status is: Inpatient Remains inpatient appropriate because: IV antibiotics, evaluation by orthopedics and surgical intervention.     Consultants:    Orthopedics  Procedures:     Antimicrobials:   As above   Subjective:  Seen along with Dr. Lajoyce Corners.  Patient reports that he feels fullness and pressure in his right toe with purulent drainage from wound.  Had fever of up to 101.4 last night.  Has little sensation in his feet.  Objective:   Vitals:   02/14/23 0047 02/14/23 0306 02/14/23 0507 02/14/23 0738  BP:   115/66 (!) 132/90  Pulse:   90 93  Resp:   16   Temp: 99.3 F (37.4 C) 99 F (37.2 C) 98.6 F (37 C) 98 F (36.7 C)  TempSrc: Oral Oral Oral Oral  SpO2:   98% 100%  Weight:      Height:        General exam: Young male, moderately built and nourished lying comfortably propped up in bed without distress. Respiratory system: Clear to auscultation. Respiratory effort normal. Cardiovascular system: S1 & S2 heard, RRR. No JVD, murmurs, rubs, gallops or clicks. No pedal edema. Gastrointestinal system: Abdomen is nondistended, soft and nontender. No organomegaly or masses felt. Normal bowel sounds heard. Central nervous system: Alert and oriented. No focal neurological deficits. Extremities: Symmetric 5 x 5 power.  Left great toe amputation site healed well.  Small linear ulcer without acute findings over the ball of the left MTP joint.  Worsening ulceration over lateral aspect of right fifth metatarsal head with fluctuance, bloody and purulent drainage, no tenderness, erythema is better.  Edema is slightly better.  Also blackened skin over the ulcer on the dorsum of the base of the right little toe.  Bound in bilateral dorsalis pedis pulsations.  See updated pictures from 6/3 as below Skin: No rashes, lesions or ulcers Psychiatry: Judgement and insight appear normal. Mood & affect appropriate.    Right foot pictures from 02/14/2023.   Right foot pictures from 02/14/2023.   Right foot pictures from 02/14/2023.     L foot 02/12/23      Data Reviewed:   I have personally reviewed following labs and imaging  studies   CBC: Recent Labs  Lab 02/12/23 1355 02/13/23 0200 02/14/23 0302  WBC 18.0* 16.8* 16.9*  NEUTROABS 15.2*  --   --   HGB 11.7* 10.4* 10.0*  HCT 35.4* 32.1* 30.1*  MCV 88.9 89.4 87.8  PLT 270 255 292    Basic Metabolic Panel: Recent Labs  Lab 02/12/23 1355 02/13/23 0200 02/14/23 0302  NA 132* 135 134*  K 4.1 3.6 3.8  CL 99 105 101  CO2 23 21* 23  GLUCOSE 372* 201* 146*  BUN 22* 18 7  CREATININE 1.50* 1.10 0.89  CALCIUM 8.6* 8.1* 8.2*    Liver Function Tests: Recent Labs  Lab 02/12/23 1355  AST 16  ALT 17  ALKPHOS 73  BILITOT 0.6  PROT 7.2  ALBUMIN 3.4*    CBG: Recent Labs  Lab 02/13/23 1721 02/13/23 2121 02/14/23 0740  GLUCAP 85 170* 149*    Microbiology Studies:   Recent Results (from the past 240 hour(s))  Blood Cultures x 2 sites     Status: None (Preliminary result)   Collection Time: 02/12/23  2:03 PM   Specimen: BLOOD  Result Value Ref Range Status  Specimen Description BLOOD LEFT ANTECUBITAL  Final   Special Requests   Final    BOTTLES DRAWN AEROBIC AND ANAEROBIC Blood Culture adequate volume   Culture   Final    NO GROWTH 2 DAYS Performed at [facility], [address] [place], [place] [address]    Report Status PENDING  Incomplete  Blood Cultures x 2 sites     Status: None (Preliminary result)   Collection Time: 02/12/23  3:57 PM   Specimen: BLOOD LEFT FOREARM  Result Value Ref Range Status   Specimen Description BLOOD LEFT FOREARM  Final   Special Requests   Final    BOTTLES DRAWN AEROBIC AND ANAEROBIC Blood Culture results may not be optimal due to an excessive volume of blood received in culture bottles   Culture   Final    NO GROWTH 2 DAYS Performed at [facility], [address], [place], [place] [address]    Report Status PENDING  Incomplete    Radiology Studies:  VAS [place] ABI WITH/WO TBI  Result Date: 02/13/2023  LOWER EXTREMITY DOPPLER STUDY Patient Name:  Douglas Murphy  Date of Exam:    02/13/2023 Medical Rec #: [id number]             Accession #:    [id number] Date of Birth: [birth year]              Patient Gender: M Patient Age:   60 years Exam Location:  [facility] Procedure:      VAS [place] ABI WITH/WO TBI Referring Phys: Victorino Dike YATES --------------------------------------------------------------------------------  Indications: Ulceration on both feet. High Risk Factors: Hypertension, hyperlipidemia, Diabetes.  Comparison Study: No prior study on file Performing Technologist: Sherren Kerns RVS  Examination Guidelines: A complete evaluation includes at minimum, Doppler waveform signals and systolic blood pressure reading at the level of bilateral brachial, anterior tibial, and posterior tibial arteries, when vessel segments are accessible. Bilateral testing is considered an integral part of a complete examination. Photoelectric Plethysmograph (PPG) waveforms and toe systolic pressure readings are included as required and additional duplex testing as needed. Limited examinations for reoccurring indications may be performed as noted.  ABI Findings: +---------+------------------+-----+---------+--------+ Right    Rt Pressure (mmHg)IndexWaveform Comment  +---------+------------------+-----+---------+--------+ Brachial 127                    triphasic         +---------+------------------+-----+---------+--------+ PTA      180               1.42 triphasic         +---------+------------------+-----+---------+--------+ DP       163               1.28 triphasic         +---------+------------------+-----+---------+--------+ Ernst Spell               0.97 Normal            +---------+------------------+-----+---------+--------+ +---------+------------------+-----+---------+-------+ Left     Lt Pressure (mmHg)IndexWaveform Comment +---------+------------------+-----+---------+-------+ Brachial 126                    triphasic         +---------+------------------+-----+---------+-------+ PTA      173               1.36 triphasic        +---------+------------------+-----+---------+-------+ DP       157  1.24 triphasic        +---------+------------------+-----+---------+-------+ Great Toe109               0.86 Normal           +---------+------------------+-----+---------+-------+ +-------+-----------+-----------+------------+------------+ ABI/TBIToday's ABIToday's TBIPrevious ABIPrevious TBI +-------+-----------+-----------+------------+------------+ Right  1.42       0.97                                +-------+-----------+-----------+------------+------------+ Left   1.36       0.86                                +-------+-----------+-----------+------------+------------+  Summary: Right: Resting right ankle-brachial index is within normal range. The right toe-brachial index is normal. Left: Resting left ankle-brachial index is within normal range. The left toe-brachial index is normal. *See table(s) above for measurements and observations.  Electronically signed by Sherald Hess MD on 02/13/2023 at 11:32:52 AM.    Final    MR FOOT LEFT W WO CONTRAST  Result Date: 02/13/2023 CLINICAL DATA:  Foot swelling, diabetic, osteomyelitis suspected, no prior imaging EXAM: MRI OF THE LEFT FOREFOOT WITHOUT AND WITH CONTRAST TECHNIQUE: Multiplanar, multisequence MR imaging of the left forefoot was performed both before and after administration of intravenous contrast. CONTRAST:  8mL GADAVIST GADOBUTROL 1 MMOL/ML IV SOLN COMPARISON:  X-ray 05/31/2021 FINDINGS: Bones/Joint/Cartilage Prior amputation of the left great toe at the first MTP joint level. Remaining osseous structures are intact. No fracture or dislocation. No bone marrow edema or periostitis. No erosions. No bone lesion. Degenerative changes of the hallux sesamoid complex. Elsewhere, no significant arthropathy. No sizable joint effusion  Ligaments Intact Lisfranc ligament. Capsular thickening of the second MTP joint. No collateral ligament disruption. Muscles and Tendons Mild edema-like signal of the foot musculature favors denervation changes. No tenosynovitis. Soft tissues There is some skin thickening the plantar aspect of the medial forefoot. No soft tissue edema or fluid collection. IMPRESSION: LEFT FOREFOOT: 1. No acute bony abnormality. Specifically, no evidence of acute osteomyelitis. 2. Prior amputation of the left great toe at the first MTP joint level. 3. Degenerative changes at the hallux-sesamoid complex. Electronically Signed   By: Duanne Guess D.O.   On: 02/13/2023 09:28   MR FOOT RIGHT W WO CONTRAST  Result Date: 02/13/2023 CLINICAL DATA:  Foot swelling, diabetic, osteomyelitis suspected, no prior imaging EXAM: MRI OF THE RIGHT FOREFOOT WITHOUT AND WITH CONTRAST TECHNIQUE: Multiplanar, multisequence MR imaging of the right forefoot was performed before and after the administration of intravenous contrast. CONTRAST:  8mL GADAVIST GADOBUTROL 1 MMOL/ML IV SOLN COMPARISON:  X-ray 02/12/2023 FINDINGS: Bones/Joint/Cartilage No acute fracture. No dislocation. No erosion. No bone marrow edema or marrow replacement. Joint spaces are relatively well preserved. Trace fifth MTP joint effusion. Ligaments Intact Lisfranc ligament.  Intact collateral ligaments. Muscles and Tendons Diffuse edema-like signal of the foot musculature which may represent changes related to denervation and/or myositis. Intact flexor and extensor tendons. No well-defined to the synovial fluid collection. Soft tissues Soft tissue wound or ulceration at the lateral aspect of the forefoot at the 5th MTP joint level. Marked surrounding soft tissue swelling and edema. Somewhat ill-defined fluid collection surrounding the 5th MTP joint measuring approximately 3.3 x 2.7 x 3.0 cm (series 6, image 30), suggesting phlegmon or developing abscess. There are several foci of  susceptibility artifact within this region compatible with  foci of soft tissue gas, as seen radiographically. Extensive subcutaneous edema throughout the dorsum of the foot. No additional collections. IMPRESSION: RIGHT FOREFOOT: 1. Soft tissue wound or ulceration at the lateral aspect of the forefoot at the 5th MTP joint level. Marked surrounding soft tissue swelling and edema. Somewhat ill-defined fluid and air collection surrounding the 5th MTP joint measuring approximately 3.3 x 2.7 x 3.0 cm, suggesting phlegmon or developing abscess. 2. Trace fifth MTP joint effusion. Septic arthritis not excluded. 3. No evidence of osteomyelitis. 4. Diffuse edema-like signal of the foot musculature which may represent changes related to denervation and/or myositis. Electronically Signed   By: Duanne Guess D.O.   On: 02/13/2023 09:22   DG Foot Complete Right  Result Date: 02/12/2023 CLINICAL DATA:  Infection EXAM: RIGHT FOOT COMPLETE - 3 VIEW COMPARISON:  None Available. FINDINGS: No evidence of fracture or dislocation. No osseous destruction. Mild degenerative changes of the IP joint of the great toe. Soft tissue edema of the lateral forefoot with associated soft tissue ulceration and tiny locule of soft tissue gas. Vascular calcifications. IMPRESSION: 1. Soft tissue edema of the lateral forefoot with associated soft tissue ulceration and tiny locule of soft tissue gas, concerning for infection with gas-forming organism. 2. No radiographic evidence of osteomyelitis. Electronically Signed   By: Allegra Lai M.D.   On: 02/12/2023 15:32    Scheduled Meds:    docusate sodium  100 mg Oral BID   enoxaparin (LOVENOX) injection  40 mg Subcutaneous Q24H   insulin aspart  0-15 Units Subcutaneous TID WC   insulin aspart  0-5 Units Subcutaneous QHS   insulin aspart  3 Units Subcutaneous TID WC   insulin glargine-yfgn  15 Units Subcutaneous BID   simvastatin  10 mg Oral QPM    Continuous Infusions:    cefTRIAXone  (ROCEPHIN)  IV 2 g (02/13/23 1241)   metronidazole 500 mg (02/14/23 0509)   vancomycin 1,500 mg (02/13/23 1622)     LOS: 2 days     Marcellus Scott, MD,  FACP, FHM, [facility], [facility], [facility]   [facility] Advisor [place]     To contact the attending provider between 7A-7P or the covering provider during after hours 7P-7A, please log into the web site [email or web address] and access using universal [place] password for that web site. If you do not have the password, please call the hospital operator.  02/14/2023, 8:46 AM
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
## 2023-02-14 NOTE — Progress Notes (Signed)
Initial Nutrition Assessment  DOCUMENTATION CODES:   Not applicable  INTERVENTION:  Encourage adequate PO intake with emphasis on protein to support wound healing Ensure Enlive po BID, each supplement provides 350 kcal and 20 grams of protein. Vitamin C 500mg  daily to support wound healing Zinc 220mg  daily x14 days to support wound healing MVI with minerals daily  NUTRITION DIAGNOSIS:   Increased nutrient needs related to wound healing as evidenced by estimated needs.  GOAL:   Patient will meet greater than or equal to 90% of their needs  MONITOR:   PO intake, Supplement acceptance, Labs, Weight trends, Skin  REASON FOR ASSESSMENT:   Consult Wound healing, COPD Protocol  ASSESSMENT:   Pt admitted with R foot ulcer with cellulitis . PMH significant for uncontrolled T2DM, periopheral neuropathy, PAD, s/p L great toe amputation (2018), HTN.   Orthopedics plans for surgery/R fifth toe amputation on 6/5.  Pt resting in the dark at time of visit. He provides limited history with short responses. He recalls having a poor appetite the past few days d/t nausea which is being managed with PRN zofran. He states this is helping "a little bit." He tries to manage his diabetes at home with diet, blood sugar checks (which run around "180 or so") and insulin.  Briefly discussed the importance of maintaining adequate nutrition for wound healing and muscle maintenance. He is agreeable to trying different supplements to support this.   Meal completions: 6/1: 100% dinner 6/2: 100% breakfast  Pt reports having a stable but fluctuating weight between 180-200 lbs. Unfortunately there is limited history on file within the last year. Weight on 07/11/22 is consistent with weight on 08/22/18 (95.7 kg). If documented weight is accurate, pt has experienced a weight loss of 11% between 07/11/22 and 02/12/23.   Medications: colace, SSI 0-15 units TID, SSI 0-5 units qhs, semglee 15 units BID, IV abx,  IV phenergan q6h PRN  Labs: sodium 134, CBG's 70-226 x24 hours  Updated HgbA1c pending; last A1c 9% (09/2017)  NUTRITION - FOCUSED PHYSICAL EXAM: Deferred to follow up d/t pt attempting to rest.   Diet Order:   Diet Order             Diet Carb Modified Fluid consistency: Thin; Room service appropriate? Yes  Diet effective now                   EDUCATION NEEDS:   Education needs have been addressed  Skin:  Skin Assessment: Skin Integrity Issues: Skin Integrity Issues:: Other (Comment) Other: non-pressure wound R lateral toe  Last BM:  6/1  Height:   Ht Readings from Last 1 Encounters:  02/12/23 6' (1.829 m)    Weight:   Wt Readings from Last 1 Encounters:  02/12/23 85.2 kg   BMI:  Body mass index is 25.47 kg/m.  Estimated Nutritional Needs:   Kcal:  2200-2400  Protein:  120-135g  Fluid:  >/=2L  Drusilla Kanner, RDN, LDN Clinical Nutrition

## 2023-02-14 NOTE — Progress Notes (Addendum)
Pharmacy Antibiotic Note  Douglas Murphy is a 57 y.o. male admitted on 02/12/2023 with sepsis/cellulitis of big toe.  Pharmacy has been consulted for vancomycin dosing. Patient presented with a right toe infection, open wound that is red and painful. MRI showed fluid collection around L foot great toe MTP joint and possible abscess surrounding R foot 5th MTP joint - no evidence of osteomyelitis. Ortho planning for toe amputation on 6/5.  Scr improved to baseline. WBC 18 >> 16. TM 100.4  Plan: Adjust vancomycin to 1500mg  IV q12 hours (eAUC 538, Scr 0.89, Vd 0.72) Continue ceftriaxone 2g IV q24 hours and metronidazole 500mg  IV q12 hours per MD Monitor clinical improvement, abx post-op plans, vanc levels as needed  Height: 6' (182.9 cm) Weight: 85.2 kg (187 lb 13.3 oz) IBW/kg (Calculated) : 77.6  Temp (24hrs), Avg:99.3 F (37.4 C), Min:98 F (36.7 C), Max:101.3 F (38.5 C)  Recent Labs  Lab 02/12/23 1355 02/13/23 0200 02/14/23 0302  WBC 18.0* 16.8* 16.9*  CREATININE 1.50* 1.10 0.89  LATICACIDVEN 2.0*  --   --      Estimated Creatinine Clearance: 104.1 mL/min (by C-G formula based on SCr of 0.89 mg/dL).    Allergies  Allergen Reactions   Other Anaphylaxis and Other (See Comments)    [place] nuts     Antimicrobials this admission: Ceftriaxone 6/1 >>  Vancomycin 6/1 >>  Metronidazole 6/2 >>  Microbiology results: 6/1 BCx: ngtd   Thank you for allowing pharmacy to be a part of this patient's care.  Rexford Maus, PharmD, BCPS 02/14/2023 10:00 AM

## 2023-02-15 DIAGNOSIS — L03115 Cellulitis of right lower limb: Secondary | ICD-10-CM | POA: Diagnosis not present

## 2023-02-15 DIAGNOSIS — L089 Local infection of the skin and subcutaneous tissue, unspecified: Secondary | ICD-10-CM | POA: Diagnosis not present

## 2023-02-15 DIAGNOSIS — E11628 Type 2 diabetes mellitus with other skin complications: Secondary | ICD-10-CM | POA: Diagnosis not present

## 2023-02-15 LAB — BASIC METABOLIC PANEL
Anion gap: 12 (ref 5–15)
BUN: 9 mg/dL (ref 6–20)
CO2: 23 mmol/L (ref 22–32)
Calcium: 8.4 mg/dL — ABNORMAL LOW (ref 8.9–10.3)
Chloride: 99 mmol/L (ref 98–111)
Creatinine, Ser: 0.93 mg/dL (ref 0.61–1.24)
GFR, Estimated: >60 mL/min (ref >60)
Glucose, Bld: 149 mg/dL — ABNORMAL HIGH (ref 70–99)
Potassium: 3.5 mmol/L (ref 3.5–5.1)
Sodium: 134 mmol/L — ABNORMAL LOW (ref 135–145)

## 2023-02-15 LAB — MISC LABCORP TEST (SEND OUT): Labcorp test code: 83935

## 2023-02-15 LAB — CBC
HCT: 31.7 % — ABNORMAL LOW (ref 39.0–52.0)
Hemoglobin: 10.8 g/dL — ABNORMAL LOW (ref 13.0–17.0)
MCH: 29.1 pg (ref 26.0–34.0)
MCHC: 34.1 g/dL (ref 30.0–36.0)
MCV: 85.4 fL (ref 80.0–100.0)
Platelets: 345 10*3/uL (ref 150–400)
RBC: 3.71 MIL/uL — ABNORMAL LOW (ref 4.22–5.81)
RDW: 12.7 % (ref 11.5–15.5)
WBC: 17.7 10*3/uL — ABNORMAL HIGH (ref 4.0–10.5)
nRBC: 0 % (ref 0.0–0.2)

## 2023-02-15 LAB — GLUCOSE, CAPILLARY
Glucose-Capillary: 135 mg/dL — ABNORMAL HIGH (ref 70–99)
Glucose-Capillary: 176 mg/dL — ABNORMAL HIGH (ref 70–99)
Glucose-Capillary: 209 mg/dL — ABNORMAL HIGH (ref 70–99)
Glucose-Capillary: 103 mg/dL — ABNORMAL HIGH (ref 70–99)

## 2023-02-15 LAB — CULTURE, BLOOD (ROUTINE X 2)

## 2023-02-15 MED ORDER — CHLORHEXIDINE GLUCONATE 4 % EX SOLN
60.0000 mL | Freq: Once | CUTANEOUS | Status: DC
  Filled 2023-02-15: qty 60

## 2023-02-15 MED ORDER — POVIDONE-IODINE 10 % EX SWAB
2.0000 | Freq: Once | CUTANEOUS | Status: AC
  Administered 2023-02-16: 2 via TOPICAL

## 2023-02-15 MED ORDER — CEFAZOLIN SODIUM-DEXTROSE 2-4 GM/100ML-% IV SOLN
2.0000 g | INTRAVENOUS | Status: DC
  Filled 2023-02-15 (×2): qty 100

## 2023-02-15 MED ORDER — POTASSIUM CHLORIDE 2 MEQ/ML IV SOLN: INTRAVENOUS | Status: DC

## 2023-02-15 MED ORDER — POTASSIUM CHLORIDE 2 MEQ/ML IV SOLN
INTRAVENOUS | Status: DC
  Filled 2023-02-15 (×4): qty 1000

## 2023-02-15 NOTE — Progress Notes (Addendum)
PROGRESS NOTE   Dhruva Fearnley  ZOX:[id number]    DOB: 11-11-1965    DOA: 02/12/2023  PCP: [name], MD   I have briefly reviewed patients previous medical records in [facility].  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  Admitted for right foot ulcer with cellulitis and possible phlegmon versus abscess of right fifth toe.  Orthopedics, Dr. Lajoyce Corners evaluated 6/3 and plans surgery/right fifth toe amputation on 6/5.   Assessment & Plan:  Principal Problem:   Diabetic foot infection (HCC) Active Problems:   Diabetes mellitus (HCC)   Acute kidney injury (HCC)   Cellulitis and abscess of right lower extremity   Essential hypertension   Dyslipidemia   Cutaneous abscess of left foot   Diabetic foot infection/right foot ulcer with cellulitis and abscess of right fifth toe: Complicating poorly controlled DM with peripheral neuropathy. Prior history of left first toe amputation and has a small linear uncomplicated ulcer on the ball of the left foot at MTP. Sepsis ruled out on admission. Has bounding bilateral dorsalis pedis pulsations and hence suspect ABIs to be unremarkable. MRI of right forefoot: Soft tissue wound or ulceration at the lateral aspect of the forefoot at the fifth MTP joint level.  Marked surrounding soft tissue swelling and edema.  Somewhat ill-defined fluid and air collection surrounding the fifth MTP joint measuring approximately 3.3 x 2.7 x 3 cm, suggesting phlegmon or developing abscess.  Septic arthritis of fifth MTP joint not excluded.  No osteomyelitis. CRP 24.9, ESR 64, prealbumin 16.   Blood cultures x 2: NTD.  Bilateral ABIs normal. Continue empirically started IV ceftriaxone, and vancomycin.  Vancomycin was discontinued on 6/3 due to nausea and vomiting. WOC team input appreciated. Orthopedics, Dr. Lajoyce Corners evaluated 6/3 and plans surgery/right fifth toe amputation on 6/5.  He advised that right toe is not salvageable.  He also did bedside some deroofing of the wound for abscess drainage until upcoming surgery. Wound without much drainage except for soaked in dry dressing on top.  Addendum: Curbside discussed with ID and they indicated that it is okay to continue just the IV vancomycin and ceftriaxone and source control as planned above.  Poorly controlled type II DM with peripheral neuropathy: Most recent A1c in [facility] was 9 in January 2019.  Patient reports that in mid May, A1c at his PCPs office may have been in the 10-11 range and states that they were discussing of trying to get it closer to 7. Reports checking CBGs twice a day with average readings about 180. Claims compliance with home Novolin 70/30, 30 units twice a day and metformin 1 g twice daily.  Holding home meds for now. Repeat A1c: 11.3.  DM coordinator input appreciated. Continue current Semglee 15 units twice daily, NovoLog moderate SSI.  Continue NovoLog mealtime 3 units 3 times daily.  Monitor closely and adjust insulins as needed. Good inpatient control.  Acute kidney injury: Suspect due to dehydration and infection.  Resolved after IV fluids.  Hyperlipidemia: Continue simvastatin.  Hypertension: Controlled.  Hold lisinopril and appears that he had not been taking it recently because his MD had it on hold.  Normocytic anemia: May be anemia of chronic disease but will need further evaluation as outpatient.  Stable.  Nausea and vomiting: On 6/3 reported nausea and vomiting, not relieved by Zofran.  Not sure if related to metronidazole, was discontinued.  Added scopolamine patch and IV Phenergan for  intractable nausea and vomiting.  Today reports that he has history of may be chronic intermittent nausea and vomiting.  Better today.  Continue IV PPI and monitor.   Body mass index is 25.47 kg/m.   ACP Documents: None DVT prophylaxis: enoxaparin (LOVENOX) injection 40 mg Start: 02/12/23 2200     Code Status: Full Code:  Family Communication: None at bedside Disposition:  Status is: Inpatient Remains inpatient appropriate because: IV antibiotics, evaluation by orthopedics and surgical intervention.     Consultants:   Orthopedics  Procedures:     Antimicrobials:   As above   Subjective:  History as noted above.  Nausea and vomiting much better today.  Vomited x 2 nonbloody yesterday but none today.  Still has some nausea but no abdominal pain.  Last BM 02/14/2023.  Objective:   Vitals:   02/14/23 1542 02/14/23 1938 02/15/23 0341 02/15/23 0744  BP: (!) 157/88 119/77 (!) 148/96 (!) 144/80  Pulse: 93 87 87 89  Resp:  15 15   Temp: 98 F (36.7 C) 98.3 F (36.8 C) 98.7 F (37.1 C) 98.3 F (36.8 C)  TempSrc: Oral   Oral  SpO2: 98% 98% 98% 98%  Weight:      Height:        General exam: Young male, moderately built and nourished lying comfortably propped up in bed without distress. Respiratory system: Clear to auscultation. Respiratory effort normal. Cardiovascular system: S1 & S2 heard, RRR. No JVD, murmurs, rubs, gallops or clicks. No pedal edema. Gastrointestinal system: Abdomen is nondistended, soft and nontender. No organomegaly or masses felt. Normal bowel sounds heard. Central nervous system: Alert and oriented. No focal neurological deficits. Extremities: Symmetric 5 x 5 power.  Left great toe amputation site healed well.  Small linear ulcer without acute findings over the ball of the left MTP joint.  6/3: Worsening ulceration over lateral aspect of right fifth metatarsal head with fluctuance, bloody and purulent drainage, no tenderness, erythema is better.  Edema  is slightly better.  Also blackened skin over the ulcer on the dorsum of the base of the right little toe.  Bounding bilateral dorsalis pedis pulsations.  See updated pictures from 6/3 as below.  6/4: Not much drainage from right foot ulceration but dressing soiled with dried drainage. Skin: No rashes, lesions or ulcers Psychiatry: Judgement and insight appear normal. Mood & affect appropriate.    Right foot pictures from 02/14/2023.   Right foot pictures from 02/14/2023.   Right foot pictures from 02/14/2023.     L foot 02/12/23      Data Reviewed:   I have personally reviewed following labs and imaging studies   CBC: Recent Labs  Lab 02/12/23 1355 02/13/23 0200 02/14/23 0302 02/15/23 0348  WBC 18.0* 16.8* 16.9* 17.7*  NEUTROABS 15.2*  --   --   --   HGB 11.7* 10.4* 10.0* 10.8*  HCT 35.4* 32.1* 30.1* 31.7*  MCV 88.9 89.4 87.8 85.4  PLT 270 255 292 345    Basic Metabolic Panel: Recent Labs  Lab 02/12/23 1355 02/13/23 0200 02/14/23 0302 02/15/23 0348  NA 132* 135 134* 134*  K 4.1 3.6 3.8 3.5  CL 99 105 101 99  CO2 23  21* 23 23  GLUCOSE 372* 201* 146* 149*  BUN 22* 18 7 9   CREATININE 1.50* 1.10 0.89 0.93  CALCIUM 8.6* 8.1* 8.2* 8.4*    Liver Function Tests: Recent Labs  Lab 02/12/23 1355  AST 16  ALT 17  ALKPHOS 73  BILITOT 0.6  PROT 7.2  ALBUMIN 3.4*    CBG: Recent Labs  Lab 02/14/23 1940 02/14/23 2230 02/15/23 0745  GLUCAP 136* 126* 135*    Microbiology Studies:   Recent Results (from the past 240 hour(s))  Blood Cultures x 2 sites     Status: None (Preliminary result)   Collection Time: 02/12/23  2:03 PM   Specimen: BLOOD  Result Value Ref Range Status   Specimen Description BLOOD LEFT ANTECUBITAL  Final   Special Requests   Final    BOTTLES DRAWN AEROBIC AND ANAEROBIC Blood Culture adequate volume   Culture   Final    NO GROWTH 2 DAYS Performed at [facility], [address], [place], [place] [address]    Report Status  PENDING  Incomplete  Blood Cultures x 2 sites     Status: None (Preliminary result)   Collection Time: 02/12/23  3:57 PM   Specimen: BLOOD LEFT FOREARM  Result Value Ref Range Status   Specimen Description BLOOD LEFT FOREARM  Final   Special Requests   Final    BOTTLES DRAWN AEROBIC AND ANAEROBIC Blood Culture results may not be optimal due to an excessive volume of blood received in culture bottles   Culture   Final    NO GROWTH 2 DAYS Performed at [facility], [address], [place], [place] [address]    Report Status PENDING  Incomplete    Radiology Studies:  VAS [place] ABI WITH/WO TBI  Result Date: 02/13/2023  LOWER EXTREMITY DOPPLER STUDY Patient Name:  Habeeb Chagolla  Date of Exam:   02/13/2023 Medical Rec #: [id number]             Accession #:    [id number] Date of Birth: 25-Jul-1966              Patient Gender: M Patient Age:   68 years Exam Location:  [facility] Procedure:      VAS [place] ABI WITH/WO TBI Referring Phys: Victorino Dike YATES --------------------------------------------------------------------------------  Indications: Ulceration on both feet. High Risk Factors: Hypertension, hyperlipidemia, Diabetes.  Comparison Study: No prior study on file Performing Technologist: Sherren Kerns RVS  Examination Guidelines: A complete evaluation includes at minimum, Doppler waveform signals and systolic blood pressure reading at the level of bilateral brachial, anterior tibial, and posterior tibial arteries, when vessel segments are accessible. Bilateral testing is considered an integral part of a complete examination. Photoelectric Plethysmograph (PPG) waveforms and toe systolic pressure readings are included as required and additional duplex testing as needed. Limited examinations for reoccurring indications may be performed as noted.  ABI Findings: +---------+------------------+-----+---------+--------+ Right    Rt Pressure (mmHg)IndexWaveform Comment   +---------+------------------+-----+---------+--------+ Brachial 127                    triphasic         +---------+------------------+-----+---------+--------+ PTA      180               1.42 triphasic         +---------+------------------+-----+---------+--------+ DP       163               1.28 triphasic         +---------+------------------+-----+---------+--------+  Great [id number]               0.97 Normal            +---------+------------------+-----+---------+--------+ +---------+------------------+-----+---------+-------+ Left     Lt Pressure (mmHg)IndexWaveform Comment +---------+------------------+-----+---------+-------+ Brachial 126                    triphasic        +---------+------------------+-----+---------+-------+ PTA      173               1.36 triphasic        +---------+------------------+-----+---------+-------+ DP       157               1.24 triphasic        +---------+------------------+-----+---------+-------+ Great [id number]               0.86 Normal           +---------+------------------+-----+---------+-------+ +-------+-----------+-----------+------------+------------+ ABI/TBIToday's ABIToday's TBIPrevious ABIPrevious TBI +-------+-----------+-----------+------------+------------+ Right  1.42       0.97                                +-------+-----------+-----------+------------+------------+ Left   1.36       0.86                                +-------+-----------+-----------+------------+------------+  Summary: Right: Resting right ankle-brachial index is within normal range. The right toe-brachial index is normal. Left: Resting left ankle-brachial index is within normal range. The left toe-brachial index is normal. *See table(s) above for measurements and observations.  Electronically signed by Sherald Hess MD on 02/13/2023 at 11:32:52 AM.    Final     Scheduled Meds:    ascorbic acid  500 mg Oral Daily    docusate sodium  100 mg Oral BID   enoxaparin (LOVENOX) injection  40 mg Subcutaneous Q24H   feeding supplement  237 mL Oral BID BM   insulin aspart  0-15 Units Subcutaneous TID WC   insulin aspart  0-5 Units Subcutaneous QHS   insulin aspart  3 Units Subcutaneous TID WC   insulin glargine-yfgn  15 Units Subcutaneous BID   melatonin  3 mg Oral QHS   multivitamin with minerals  1 tablet Oral Daily   pantoprazole (PROTONIX) IV  40 mg Intravenous Q24H   scopolamine  1 patch Transdermal Q72H   simvastatin  10 mg Oral QPM   zinc sulfate  220 mg Oral Daily    Continuous Infusions:    cefTRIAXone (ROCEPHIN)  IV 2 g (02/14/23 1310)   promethazine (PHENERGAN) injection (IM or IVPB) 12.5 mg (02/15/23 0544)   vancomycin 1,500 mg (02/14/23 2223)     LOS: 3 days     Marcellus Scott, MD,  FACP, FHM, SFHM, [facility], [facility]   [facility] Advisor [place]     To contact the attending provider between 7A-7P or the covering provider during after hours 7P-7A, please log into the web site [email or web address] and access using universal [place] password for that web site. If you do not have the password, please call the hospital operator.  02/15/2023, 8:49 AM

## 2023-02-16 ENCOUNTER — Inpatient Hospital Stay (HOSPITAL_COMMUNITY): Payer: 59 | Admitting: Registered Nurse

## 2023-02-16 ENCOUNTER — Encounter (HOSPITAL_COMMUNITY): Payer: Self-pay | Admitting: Internal Medicine

## 2023-02-16 ENCOUNTER — Encounter (HOSPITAL_COMMUNITY)
Admission: EM | Disposition: A | Discharge status: Home / Self Care | Payer: Self-pay | Source: Home / Self Care | Attending: Internal Medicine

## 2023-02-16 DIAGNOSIS — L02611 Cutaneous abscess of right foot: Secondary | ICD-10-CM

## 2023-02-16 DIAGNOSIS — D649 Anemia, unspecified: Secondary | ICD-10-CM | POA: Diagnosis not present

## 2023-02-16 DIAGNOSIS — I10 Essential (primary) hypertension: Secondary | ICD-10-CM | POA: Diagnosis not present

## 2023-02-16 DIAGNOSIS — E1151 Type 2 diabetes mellitus with diabetic peripheral angiopathy without gangrene: Secondary | ICD-10-CM | POA: Diagnosis not present

## 2023-02-16 DIAGNOSIS — L089 Local infection of the skin and subcutaneous tissue, unspecified: Secondary | ICD-10-CM

## 2023-02-16 DIAGNOSIS — E11628 Type 2 diabetes mellitus with other skin complications: Principal | ICD-10-CM

## 2023-02-16 HISTORY — PX: AMPUTATION: SHX166

## 2023-02-16 HISTORY — DX: Cutaneous abscess of right foot: L02.611

## 2023-02-16 LAB — BASIC METABOLIC PANEL
Anion gap: 9 (ref 5–15)
BUN: 13 mg/dL (ref 6–20)
CO2: 23 mmol/L (ref 22–32)
Calcium: 8.3 mg/dL — ABNORMAL LOW (ref 8.9–10.3)
Chloride: 105 mmol/L (ref 98–111)
Creatinine, Ser: 0.9 mg/dL (ref 0.61–1.24)
GFR, Estimated: >60 mL/min (ref >60)
Glucose, Bld: 85 mg/dL (ref 70–99)
Potassium: 3.7 mmol/L (ref 3.5–5.1)
Sodium: 137 mmol/L (ref 135–145)

## 2023-02-16 LAB — AEROBIC/ANAEROBIC CULTURE W GRAM STAIN (SURGICAL/DEEP WOUND)

## 2023-02-16 LAB — CBC
HCT: 31.3 % — ABNORMAL LOW (ref 39.0–52.0)
Hemoglobin: 10.2 g/dL — ABNORMAL LOW (ref 13.0–17.0)
MCH: 28.8 pg (ref 26.0–34.0)
MCHC: 32.6 g/dL (ref 30.0–36.0)
MCV: 88.4 fL (ref 80.0–100.0)
Platelets: 373 10*3/uL (ref 150–400)
RBC: 3.54 MIL/uL — ABNORMAL LOW (ref 4.22–5.81)
RDW: 12.9 % (ref 11.5–15.5)
WBC: 15.2 10*3/uL — ABNORMAL HIGH (ref 4.0–10.5)
nRBC: 0 % (ref 0.0–0.2)

## 2023-02-16 LAB — GLUCOSE, CAPILLARY
Glucose-Capillary: 109 mg/dL — ABNORMAL HIGH (ref 70–99)
Glucose-Capillary: 123 mg/dL — ABNORMAL HIGH (ref 70–99)
Glucose-Capillary: 132 mg/dL — ABNORMAL HIGH (ref 70–99)
Glucose-Capillary: 157 mg/dL — ABNORMAL HIGH (ref 70–99)
Glucose-Capillary: 118 mg/dL — ABNORMAL HIGH (ref 70–99)

## 2023-02-16 LAB — CULTURE, BLOOD (ROUTINE X 2): Culture: NO GROWTH

## 2023-02-16 LAB — MRSA NEXT GEN BY PCR, NASAL: MRSA by PCR Next Gen: NOT DETECTED

## 2023-02-16 SURGERY — AMPUTATION, FOOT, RAY
Anesthesia: Monitor Anesthesia Care | Laterality: Right

## 2023-02-16 MED ORDER — MIDAZOLAM HCL 2 MG/2ML IJ SOLN
INTRAMUSCULAR | Status: AC
  Filled 2023-02-16: qty 2

## 2023-02-16 MED ORDER — FENTANYL CITRATE (PF) 100 MCG/2ML IJ SOLN: 50.0000 ug | Freq: Once | INTRAMUSCULAR | Status: AC

## 2023-02-16 MED ORDER — BISACODYL 5 MG PO TBEC: 5.0000 mg | DELAYED_RELEASE_TABLET | Freq: Every day | ORAL | Status: DC | PRN

## 2023-02-16 MED ORDER — POLYETHYLENE GLYCOL 3350 17 G PO PACK: 17.0000 g | PACK | Freq: Every day | ORAL | Status: DC | PRN

## 2023-02-16 MED ORDER — DOCUSATE SODIUM 100 MG PO CAPS: 100.0000 mg | ORAL_CAPSULE | Freq: Every day | ORAL | Status: DC

## 2023-02-16 MED ORDER — OXYCODONE HCL 5 MG PO TABS: 5.0000 mg | ORAL_TABLET | Freq: Once | ORAL | Status: DC | PRN

## 2023-02-16 MED ORDER — OXYCODONE HCL 5 MG PO TABS: 5.0000 mg | ORAL_TABLET | ORAL | Status: DC | PRN

## 2023-02-16 MED ORDER — VITAMIN C 500 MG PO TABS: 1000.0000 mg | ORAL_TABLET | Freq: Every day | ORAL | Status: DC

## 2023-02-16 MED ORDER — LACTATED RINGERS IV SOLN: INTRAVENOUS | Status: DC

## 2023-02-16 MED ORDER — AMISULPRIDE (ANTIEMETIC) 5 MG/2ML IV SOLN: 10.0000 mg | Freq: Once | INTRAVENOUS | Status: AC

## 2023-02-16 MED ORDER — PHENOL 1.4 % MT LIQD: 1.0000 | OROMUCOSAL | Status: DC | PRN

## 2023-02-16 MED ORDER — 0.9 % SODIUM CHLORIDE (POUR BTL) OPTIME
TOPICAL | Status: DC | PRN
  Administered 2023-02-16: 1000 mL

## 2023-02-16 MED ORDER — ORAL CARE MOUTH RINSE: 15.0000 mL | Freq: Once | OROMUCOSAL | Status: AC

## 2023-02-16 MED ORDER — ACETAMINOPHEN 325 MG PO TABS: 325.0000 mg | ORAL_TABLET | Freq: Four times a day (QID) | ORAL | Status: DC | PRN

## 2023-02-16 MED ORDER — PANTOPRAZOLE SODIUM 40 MG PO TBEC
40.0000 mg | DELAYED_RELEASE_TABLET | Freq: Every day | ORAL | Status: DC
  Administered 2023-02-17 – 2023-02-18 (×2): 40 mg via ORAL
  Filled 2023-02-16 (×2): qty 1

## 2023-02-16 MED ORDER — MAGNESIUM CITRATE PO SOLN: 1.0000 | Freq: Once | ORAL | Status: DC | PRN

## 2023-02-16 MED ORDER — MIDAZOLAM HCL 2 MG/2ML IJ SOLN
INTRAMUSCULAR | Status: AC
  Administered 2023-02-16: 2 mg via INTRAVENOUS
  Filled 2023-02-16: qty 2

## 2023-02-16 MED ORDER — CHLORHEXIDINE GLUCONATE 0.12 % MT SOLN
15.0000 mL | Freq: Once | OROMUCOSAL | Status: AC
  Administered 2023-02-16: 15 mL via OROMUCOSAL
  Filled 2023-02-16: qty 15

## 2023-02-16 MED ORDER — SODIUM CHLORIDE 0.9 % IV SOLN: INTRAVENOUS | Status: DC

## 2023-02-16 MED ORDER — POVIDONE-IODINE 10 % EX SWAB: 2.0000 | Freq: Once | CUTANEOUS | Status: DC

## 2023-02-16 MED ORDER — FENTANYL CITRATE (PF) 100 MCG/2ML IJ SOLN: 25.0000 ug | INTRAMUSCULAR | Status: DC | PRN

## 2023-02-16 MED ORDER — ONDANSETRON HCL 4 MG/2ML IJ SOLN: 4.0000 mg | Freq: Four times a day (QID) | INTRAMUSCULAR | Status: AC | PRN

## 2023-02-16 MED ORDER — PROPOFOL 10 MG/ML IV BOLUS
INTRAVENOUS | Status: AC
  Filled 2023-02-16: qty 20

## 2023-02-16 MED ORDER — FENTANYL CITRATE (PF) 250 MCG/5ML IJ SOLN
INTRAMUSCULAR | Status: AC
  Filled 2023-02-16: qty 5

## 2023-02-16 MED ORDER — CEFAZOLIN SODIUM-DEXTROSE 2-4 GM/100ML-% IV SOLN
2.0000 g | INTRAVENOUS | Status: AC
  Administered 2023-02-16: 2 g via INTRAVENOUS

## 2023-02-16 MED ORDER — CHLORHEXIDINE GLUCONATE 4 % EX SOLN: 60.0000 mL | Freq: Once | CUTANEOUS | Status: DC

## 2023-02-16 MED ORDER — MAGNESIUM SULFATE 2 GM/50ML IV SOLN
2.0000 g | Freq: Every day | INTRAVENOUS | Status: DC | PRN
  Filled 2023-02-16: qty 50

## 2023-02-16 MED ORDER — HYDROMORPHONE HCL 1 MG/ML IJ SOLN: 0.5000 mg | INTRAMUSCULAR | Status: DC | PRN

## 2023-02-16 MED ORDER — ALUM & MAG HYDROXIDE-SIMETH 200-200-20 MG/5ML PO SUSP: 15.0000 mL | ORAL | Status: DC | PRN

## 2023-02-16 MED ORDER — POTASSIUM CHLORIDE CRYS ER 20 MEQ PO TBCR: 20.0000 meq | EXTENDED_RELEASE_TABLET | Freq: Every day | ORAL | Status: DC | PRN

## 2023-02-16 MED ORDER — ONDANSETRON HCL 4 MG/2ML IJ SOLN
INTRAMUSCULAR | Status: AC
  Administered 2023-02-16: 4 mg via INTRAVENOUS
  Filled 2023-02-16: qty 2

## 2023-02-16 MED ORDER — FENTANYL CITRATE (PF) 100 MCG/2ML IJ SOLN
INTRAMUSCULAR | Status: AC
  Administered 2023-02-16: 50 ug via INTRAVENOUS
  Filled 2023-02-16: qty 2

## 2023-02-16 MED ORDER — OXYCODONE HCL 5 MG/5ML PO SOLN: 5.0000 mg | Freq: Once | ORAL | Status: DC | PRN

## 2023-02-16 MED ORDER — HYDRALAZINE HCL 20 MG/ML IJ SOLN: 5.0000 mg | INTRAMUSCULAR | Status: DC | PRN

## 2023-02-16 MED ORDER — MIDAZOLAM HCL 2 MG/2ML IJ SOLN: 2.0000 mg | Freq: Once | INTRAMUSCULAR | Status: AC

## 2023-02-16 MED ORDER — ONDANSETRON HCL 4 MG/2ML IJ SOLN: 4.0000 mg | Freq: Four times a day (QID) | INTRAMUSCULAR | Status: DC | PRN

## 2023-02-16 MED ORDER — JUVEN PO PACK
1.0000 | PACK | Freq: Two times a day (BID) | ORAL | Status: DC
  Administered 2023-02-16 – 2023-02-18 (×4): 1 via ORAL
  Filled 2023-02-16 (×4): qty 1

## 2023-02-16 MED ORDER — OXYCODONE HCL 5 MG PO TABS: 10.0000 mg | ORAL_TABLET | ORAL | Status: DC | PRN

## 2023-02-16 MED ORDER — ZINC SULFATE 220 (50 ZN) MG PO CAPS: 220.0000 mg | ORAL_CAPSULE | Freq: Every day | ORAL | Status: DC

## 2023-02-16 MED ORDER — PROPOFOL 10 MG/ML IV BOLUS
INTRAVENOUS | Status: DC | PRN
  Administered 2023-02-16: 50 ug/kg/min via INTRAVENOUS
  Administered 2023-02-16 (×3): 10 mg via INTRAVENOUS

## 2023-02-16 MED ORDER — METOPROLOL TARTRATE 5 MG/5ML IV SOLN: 2.0000 mg | INTRAVENOUS | Status: DC | PRN

## 2023-02-16 MED ORDER — GUAIFENESIN-DM 100-10 MG/5ML PO SYRP: 15.0000 mL | ORAL_SOLUTION | ORAL | Status: DC | PRN

## 2023-02-16 MED ORDER — AMISULPRIDE (ANTIEMETIC) 5 MG/2ML IV SOLN
INTRAVENOUS | Status: AC
  Administered 2023-02-16: 10 mg via INTRAVENOUS
  Filled 2023-02-16: qty 4

## 2023-02-16 MED ORDER — LABETALOL HCL 5 MG/ML IV SOLN: 10.0000 mg | INTRAVENOUS | Status: DC | PRN

## 2023-02-16 MED ORDER — BUPIVACAINE HCL (PF) 0.5 % IJ SOLN
INTRAMUSCULAR | Status: DC | PRN
  Administered 2023-02-16: 30 mL via PERINEURAL

## 2023-02-16 SURGICAL SUPPLY — 32 items
BAG COUNTER SPONGE SURGICOUNT (BAG) ×1 IMPLANT
BAG SPNG CNTER NS LX DISP (BAG) ×1
BLADE SAW SGTL MED 73X18.5 STR (BLADE) IMPLANT
BLADE SURG 21 STRL SS (BLADE) ×1 IMPLANT
BNDG COHESIVE 4X5 TAN STRL (GAUZE/BANDAGES/DRESSINGS) ×1 IMPLANT
BNDG GAUZE DERMACEA FLUFF 4 (GAUZE/BANDAGES/DRESSINGS) ×1 IMPLANT
BNDG GZE DERMACEA 4 6PLY (GAUZE/BANDAGES/DRESSINGS) ×1
COVER SURGICAL LIGHT HANDLE (MISCELLANEOUS) ×2 IMPLANT
DRAPE U-SHAPE 47X51 STRL (DRAPES) ×2 IMPLANT
DRSG ADAPTIC 3X8 NADH LF (GAUZE/BANDAGES/DRESSINGS) ×1 IMPLANT
DURAPREP 26ML APPLICATOR (WOUND CARE) ×1 IMPLANT
ELECT REM PT RETURN 9FT ADLT (ELECTROSURGICAL) ×1
ELECTRODE REM PT RTRN 9FT ADLT (ELECTROSURGICAL) ×1 IMPLANT
GAUZE PAD ABD 8X10 STRL (GAUZE/BANDAGES/DRESSINGS) ×2 IMPLANT
GAUZE SPONGE 4X4 12PLY STRL (GAUZE/BANDAGES/DRESSINGS) ×1 IMPLANT
GLOVE BIOGEL PI IND STRL 9 (GLOVE) ×1 IMPLANT
GLOVE SURG ORTHO 9.0 STRL STRW (GLOVE) ×1 IMPLANT
GOWN STRL REUS W/ TWL XL LVL3 (GOWN DISPOSABLE) ×2 IMPLANT
GOWN STRL REUS W/TWL XL LVL3 (GOWN DISPOSABLE) ×2
GRAFT SKIN WND MICRO 38 (Tissue) IMPLANT
KIT BASIN OR (CUSTOM PROCEDURE TRAY) ×1 IMPLANT
KIT DRSG PREVENA PLUS 7DAY 125 (MISCELLANEOUS) IMPLANT
KIT PREVENA INCISION MGT 13 (CANNISTER) IMPLANT
KIT TURNOVER KIT B (KITS) ×1 IMPLANT
NS IRRIG 1000ML POUR BTL (IV SOLUTION) ×1 IMPLANT
PACK ORTHO EXTREMITY (CUSTOM PROCEDURE TRAY) ×1 IMPLANT
PAD ARMBOARD 7.5X6 YLW CONV (MISCELLANEOUS) ×2 IMPLANT
STOCKINETTE IMPERVIOUS LG (DRAPES) IMPLANT
SUT ETHILON 2 0 PSLX (SUTURE) ×1 IMPLANT
TOWEL GREEN STERILE (TOWEL DISPOSABLE) ×1 IMPLANT
TUBE CONNECTING 12X1/4 (SUCTIONS) ×1 IMPLANT
YANKAUER SUCT BULB TIP NO VENT (SUCTIONS) ×1 IMPLANT

## 2023-02-16 NOTE — Anesthesia Preprocedure Evaluation (Signed)
Anesthesia Evaluation  Patient identified by MRN, date of birth, ID band Patient awake    Reviewed: Allergy & Precautions, H&P , NPO status , Patient's Chart, lab work & pertinent test results  Airway Mallampati: II   Neck ROM: full    Dental   Pulmonary neg pulmonary ROS   breath sounds clear to auscultation       Cardiovascular hypertension, + Peripheral Vascular Disease   Rhythm:regular Rate:Normal     Neuro/Psych    GI/Hepatic   Endo/Other  diabetes, Type 2    Renal/GU      Musculoskeletal   Abdominal   Peds  Hematology  (+) Blood dyscrasia, anemia   Anesthesia Other Findings   Reproductive/Obstetrics                             Anesthesia Physical Anesthesia Plan  ASA: 3  Anesthesia Plan: MAC and Regional   Post-op Pain Management:    Induction: Intravenous  PONV Risk Score and Plan: 1 and Propofol infusion, Treatment may vary due to age or medical condition and Midazolam  Airway Management Planned: Simple Face Mask  Additional Equipment:   Intra-op Plan:   Post-operative Plan:   Informed Consent: I have reviewed the patients History and Physical, chart, labs and discussed the procedure including the risks, benefits and alternatives for the proposed anesthesia with the patient or authorized representative who has indicated his/her understanding and acceptance.     Dental advisory given  Plan Discussed with: CRNA, Anesthesiologist and Surgeon  Anesthesia Plan Comments:        Anesthesia Quick Evaluation

## 2023-02-16 NOTE — Op Note (Signed)
02/16/2023  12:24 PM  PATIENT:  Douglas Murphy    PRE-OPERATIVE DIAGNOSIS:  Abscess Right foot  POST-OPERATIVE DIAGNOSIS:  Same  PROCEDURE:  RIGHT FOOT 5TH RAY AMPUTATION Local tissue rearrangement for wound closure 10 x 5 cm. Application of Kerecis micro graft 38 cm. Application of cleanse choice sponge and Prevena 13 centimeter sponge  SURGEON:  Nadara Mustard, MD  PHYSICIAN ASSISTANT:None ANESTHESIA:   General  PREOPERATIVE INDICATIONS:  Douglas Murphy is a  58 y.o. male with a diagnosis of Abscess Right foot who failed conservative measures and elected for surgical management.    The risks benefits and alternatives were discussed with the patient preoperatively including but not limited to the risks of infection, bleeding, nerve injury, cardiopulmonary complications, the need for revision surgery, among others, and the patient was willing to proceed.  OPERATIVE IMPLANTS:   Implant Name Type Inv. Item Serial No. Manufacturer Lot No. LRB No. Used Action  GRAFT SKIN WND MICRO 38 - [id number] Tissue GRAFT SKIN WND MICRO 38  [employer] [id number] Right 1 Implanted    @ENCIMAGES @  OPERATIVE FINDINGS: Patient had extensive abscess that extended dorsally.  This required additional soft tissue resection.  The abscess tissue was sent for cultures.  Patient will need antibiotic coverage for 3 to 4 weeks ideally oral antibiotics based on culture sensitivities.  Continue current IV antibiotics until cultures are finalized.  OPERATIVE PROCEDURE: Patient brought the operating room after undergoing a regional anesthetic.  After adequate levels anesthesia were obtained patient's right lower extremity was prepped using DuraPrep draped into a sterile field a timeout was called.  A racquet incision was made around the first ray and encompassing the ulcerative tissue.  Patient had extensive abscess that extended dorsally this required additional soft tissue resection dorsally.  After  resection of the fifth ray this left a wound that was 5 x 10 cm.  The abscess tissue was sent for cultures.  The wound was irrigated with normal saline electrocautery was used for hemostasis.  The wound bed was filled with 38 cm of Kerecis micro powder to cover a wound surface area 50 cm.  Local tissue rearrangement was performed after undermining the tissue edges with 2-0 nylon to close the 10 x 5 cm wound.  The Kerecis cleanse choice wound VAC sponge was applied over the open area which was approximately 2 cm in diameter.  The 13 cm VAC was applied over the entire wound and sponge.  This was sealed with derma tack and Ioban.  This had a good suction fit patient was extubated taken the PACU in stable condition.   DISCHARGE PLANNING:  Antibiotic duration: Continue IV antibiotics until tissue culture is finalized.  Patient ideally would be discharged on 3 to 4 weeks of oral antibiotics based on tissue cultures.  Weightbearing: Touchdown weightbearing on the right  Pain medication: Opioid pathway  Dressing care/ Wound VAC: Continue wound VAC for 1 week  Ambulatory devices: Walker or crutches  Discharge to: Anticipate discharge to home.  Follow-up: In the office 1 week post operative.

## 2023-02-16 NOTE — Transfer of Care (Signed)
Immediate Anesthesia Transfer of Care Note  Patient: Makenzie Rannow  Procedure(s) Performed: RIGHT FOOT 5TH RAY AMPUTATION (Right)  Patient Location: PACU  Anesthesia Type:MAC  Level of Consciousness: awake  Airway & Oxygen Therapy: Patient Spontanous Breathing  Post-op Assessment: Report given to RN and Post -op Vital signs reviewed and stable  Post vital signs: Reviewed and stable  Last Vitals:  Vitals Value Taken Time  BP 141/82 02/16/23 1208  Temp    Pulse 76 02/16/23 1212  Resp 14 02/16/23 1212  SpO2 97 % 02/16/23 1212  Vitals shown include unvalidated device data.  Last Pain:  Vitals:   02/16/23 1011  TempSrc: Oral  PainSc: 0-No pain         Complications: No notable events documented.

## 2023-02-16 NOTE — Anesthesia Procedure Notes (Signed)
Anesthesia Regional Block: Popliteal block   Pre-Anesthetic Checklist: , timeout performed,  Correct Patient, Correct Site, Correct Laterality,  Correct Procedure, Correct Position, site marked,  Risks and benefits discussed,  Surgical consent,  Pre-op evaluation,  At surgeon's request and post-op pain management  Laterality: Right  Prep: chloraprep       Needles:  Injection technique: Single-shot  Needle Type: Echogenic Stimulator Needle          Additional Needles:   Procedures:, nerve stimulator,,,,,     Nerve Stimulator or Paresthesia:  Response: plantar flexion of foot, 0.45 mA  Additional Responses:   Narrative:  Start time: 02/16/2023 10:38 AM End time: 02/16/2023 10:46 AM Injection made incrementally with aspirations every 5 mL.  Performed by: Personally  Anesthesiologist: Achille Rich, MD  Additional Notes: Functioning IV was confirmed and monitors were applied.  A 90mm 21ga Arrow echogenic stimulator needle was used. Sterile prep and drape,hand hygiene and sterile gloves were used.  Negative aspiration and negative test dose prior to incremental administration of local anesthetic. The patient tolerated the procedure well.  Ultrasound guidance: relevent anatomy identified, needle position confirmed, local anesthetic spread visualized around nerve(s), vascular puncture avoided.  Image printed for medical record.

## 2023-02-16 NOTE — Progress Notes (Signed)
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 Admitted for right foot ulcer with cellulitis and possible phlegmon versus abscess of right fifth toe. Orthopedics, Dr. Lajoyce Corners evaluated him on 6/3 and plans surgery/right fifth toe amputation on 6/5.   Assessment & Plan:   Principal Problem:   Diabetic foot infection (HCC) Active Problems:   Diabetes mellitus (HCC)   Acute kidney injury (HCC)   Cellulitis and abscess of right lower extremity   Essential hypertension   Dyslipidemia   Cutaneous abscess of left foot   Cutaneous abscess of right foot  Diabetic foot infection: Right foot ulcer with cellulitis and abscess of right fifth toe: Patient with poorly controlled diabetes with peripheral neuropathy. Prior history of left first toe amputation and has a small linear uncomplicated ulcer on the ball of the left foot at MTP. Sepsis was ruled out on admission. MRI of right forefoot: Soft tissue wound or ulceration at the lateral aspect of the forefoot at the fifth MTP joint level.  Marked surrounding soft tissue swelling and edema suggesting phlegmon or developing abscess.  Septic arthritis of fifth MTP joint not excluded.  No osteomyelitis. CRP 24.9, ESR 64, prealbumin 16.  Blood cultures x 2: NTD.  Bilateral ABIs normal. Empirically started on IV ceftriaxone, and vancomycin.  Vancomycin was discontinued on 6/3 due to nausea and vomiting. WOC team input appreciated. Orthopedics, Dr. Lajoyce Corners evaluated the patient on 6/3 and plans surgery/right fifth toe amputation on  6/5.   He advised that right toe is not salvageable.  He also did bedside some deroofing of the wound for abscess drainage until upcoming surgery. Curbside discussed with ID and they indicated that it is okay to continue just the IV vancomycin and ceftriaxone and source control as planned above.   Type 2 diabetes with peripheral neuropathy, poorly controlled: Most recent HbA1c in [facility] was 9.0 in January 2019.   Patient reports that in mid May, A1c at his PCPs office may have been in the 10-11 range and states that they were discussing of trying to get it closer to 7. He reports checking CBGs twice a day with average readings about 180. Claims compliance with home Novolin 70/30, 30 units twice a day and metformin 1 g twice daily.  Holding home meds for now. Repeat A1c: 11.3.  DM coordinator input appreciated. Continue current Semglee 15 units twice daily, NovoLog moderate SSI.   Continue NovoLog mealtime 3 units 3 times daily.  Monitor closely and adjust insulins as needed. Good inpatient control.   Acute kidney injury: > Resolved. Suspect due to dehydration and infection.  Resolved after IV fluids.   Hyperlipidemia: Continue simvastatin.   Hypertension: Controlled.  His lisinopril is kept on hold by his PCP.   Normocytic anemia: May be anemia of chronic disease but will need further evaluation as outpatient.  Stable.   Nausea and vomiting: Patient reported nausea and vomiting not relieved by Zofran.  Not sure if related to metronidazole, was discontinued.   Added scopolamine patch and IV Phenergan for intractable nausea and vomiting.  Continue IV pantoprazole 40 mg daily  Body mass index is 25.47 kg/m.   DVT prophylaxis: Lovenox Code Status: Full code Family Communication: No family at bed side. Disposition Plan:    Status is: Inpatient Remains inpatient appropriate because: Admitted for right foot infection,  requiring IV antibiotics and Ortho evaluation,  possible surgical  intervention   Consultants:  Orthopaedics  Procedures:  Antimicrobials: Anti-infectives (From admission, onward)    Start     Dose/Rate Route Frequency Ordered Stop   02/16/23 1030  ceFAZolin (ANCEF) IVPB 2g/100 mL premix        2 g 200 mL/hr over 30 Minutes Intravenous On call to O.R. 02/16/23 1016 02/16/23 1156   02/16/23 0600  ceFAZolin (ANCEF) IVPB 2g/100 mL premix  Status:  Discontinued        2 g 200 mL/hr over 30 Minutes Intravenous To Short Stay 02/15/23 2344 02/16/23 1019   02/14/23 1000  vancomycin (VANCOREADY) IVPB 1500 mg/300 mL        1,500 mg 150 mL/hr over 120 Minutes Intravenous Every 12 hours 02/14/23 0958     02/13/23 1800  metroNIDAZOLE (FLAGYL) IVPB 500 mg  Status:  Discontinued        500 mg 100 mL/hr over 60 Minutes Intravenous Every 12 hours 02/12/23 1712 02/14/23 1700   02/13/23 1500  vancomycin (VANCOREADY) IVPB 1500 mg/300 mL  Status:  Discontinued        1,500 mg 150 mL/hr over 120 Minutes Intravenous Every 24 hours 02/12/23 1529 02/14/23 0958   02/13/23 1200  cefTRIAXone (ROCEPHIN) 2 g in sodium chloride 0.9 % 100 mL IVPB        2 g 200 mL/hr over 30 Minutes Intravenous Every 24 hours 02/12/23 1654     02/12/23 1700  metroNIDAZOLE (FLAGYL) IVPB 500 mg  Status:  Discontinued        500 mg 100 mL/hr over 60 Minutes Intravenous Every 8 hours 02/12/23 1654 02/12/23 1712   02/12/23 1515  vancomycin (VANCOREADY) IVPB 2000 mg/400 mL        2,000 mg 200 mL/hr over 120 Minutes Intravenous  Once 02/12/23 1514 02/12/23 1815   02/12/23 1500  vancomycin (VANCOCIN) IVPB 1000 mg/200 mL premix  Status:  Discontinued        1,000 mg 200 mL/hr over 60 Minutes Intravenous  Once 02/12/23 1458 02/12/23 1514   02/12/23 1500  cefTRIAXone (ROCEPHIN) 2 g in sodium chloride 0.9 % 100 mL IVPB        2 g 200 mL/hr over 30 Minutes Intravenous  Once 02/12/23 1458 02/12/23 1607      Subjective: Patient was seen and examined at bedside.  Overnight events noted. Patient is  scheduled to have right fifth toe amputation.  He reports pain is reasonably controlled with pain medications.  Objective: Vitals:   02/16/23 1215 02/16/23 1230 02/16/23 1245 02/16/23 1321  BP: (!) 147/89 (!) 157/97 (!) 174/97 (!) 162/100  Pulse: 71 69 76 80  Resp: 14 (!) 9 15   Temp:   98.3 F (36.8 C)   TempSrc:      SpO2: 96% 100% 99%   Weight:      Height:        Intake/Output Summary (Last 24 hours) at 02/16/2023 1341 Last data filed at 02/16/2023 1202 Gross per 24 hour  Intake 1705.76 ml  Output 30 ml  Net 1675.76 ml   Filed Weights   02/12/23 1353 02/12/23 1736  Weight: 83.9 kg 85.2 kg    Examination:  General exam: Appears calm  and comfortable, deconditioned, not in any acute distress. Respiratory system: Clear to auscultation. Respiratory effort normal.  RR 16 Cardiovascular system: S1 & S2 heard, RRR. No JVD, murmurs, rubs, gallops or clicks. No pedal edema. Gastrointestinal system: Abdomen is soft, non tender, non distended, bowel sounds present Central nervous system: Alert and oriented x 3. No focal neurological deficits. Extremities: Right foot erythema, swelling, tenderness. Skin: No rashes, lesions or ulcers Psychiatry: Judgement and insight appear normal. Mood & affect appropriate.     Data Reviewed: I have personally reviewed following labs and imaging studies  CBC: Recent Labs  Lab 02/12/23 1355 02/13/23 0200 02/14/23 0302 02/15/23 0348 02/16/23 0258  WBC 18.0* 16.8* 16.9* 17.7* 15.2*  NEUTROABS 15.2*  --   --   --   --   HGB 11.7* 10.4* 10.0* 10.8* 10.2*  HCT 35.4* 32.1* 30.1* 31.7* 31.3*  MCV 88.9 89.4 87.8 85.4 88.4  PLT 270 255 292 345 373   Basic Metabolic Panel: Recent Labs  Lab 02/12/23 1355 02/13/23 0200 02/14/23 0302 02/15/23 0348 02/16/23 0258  NA 132* 135 134* 134* 137  K 4.1 3.6 3.8 3.5 3.7  CL 99 105 101 99 105  CO2 23 21* 23 23 23   GLUCOSE 372* 201* 146* 149* 85  BUN 22* 18 7 9 13   CREATININE 1.50* 1.10 0.89 0.93  0.90  CALCIUM 8.6* 8.1* 8.2* 8.4* 8.3*   GFR: Estimated Creatinine Clearance: 103 mL/min (by C-G formula based on SCr of 0.9 mg/dL). Liver Function Tests: Recent Labs  Lab 02/12/23 1355  AST 16  ALT 17  ALKPHOS 73  BILITOT 0.6  PROT 7.2  ALBUMIN 3.4*   No results for input(s): "LIPASE", "AMYLASE" in the last 168 hours. No results for input(s): "AMMONIA" in the last 168 hours. Coagulation Profile: Recent Labs  Lab 02/12/23 1628  INR 1.2   Cardiac Enzymes: No results for input(s): "CKTOTAL", "CKMB", "CKMBINDEX", "TROPONINI" in the last 168 hours. BNP (last 3 results) No results for input(s): "PROBNP" in the last 8760 hours. HbA1C: No results for input(s): "HGBA1C" in the last 72 hours. CBG: Recent Labs  Lab 02/15/23 1615 02/15/23 2134 02/16/23 0743 02/16/23 1010 02/16/23 1213  GLUCAP 209* 103* 109* 123* 132*   Lipid Profile: No results for input(s): "CHOL", "HDL", "LDLCALC", "TRIG", "CHOLHDL", "LDLDIRECT" in the last 72 hours. Thyroid Function Tests: No results for input(s): "TSH", "T4TOTAL", "FREET4", "T3FREE", "THYROIDAB" in the last 72 hours. Anemia Panel: No results for input(s): "VITAMINB12", "FOLATE", "FERRITIN", "TIBC", "IRON", "RETICCTPCT" in the last 72 hours. Sepsis Labs: Recent Labs  Lab 02/12/23 1355  LATICACIDVEN 2.0*    Recent Results (from the past 240 hour(s))  Blood Cultures x 2 sites     Status: None (Preliminary result)   Collection Time: 02/12/23  2:03 PM   Specimen: BLOOD  Result Value Ref Range Status   Specimen Description BLOOD LEFT ANTECUBITAL  Final   Special Requests   Final    BOTTLES DRAWN AEROBIC AND ANAEROBIC Blood Culture adequate volume   Culture   Final    NO GROWTH 4 DAYS Performed at [facility], [address], [place], [place] [address]    Report Status PENDING  Incomplete  Blood Cultures x 2 sites     Status: None (Preliminary result)   Collection Time: 02/12/23  3:57 PM   Specimen: BLOOD LEFT FOREARM   Result Value Ref Range Status   Specimen Description BLOOD LEFT FOREARM  Final   Special Requests   Final  BOTTLES DRAWN AEROBIC AND ANAEROBIC Blood Culture results may not be optimal due to an excessive volume of blood received in culture bottles   Culture   Final    NO GROWTH 4 DAYS Performed at [facility], [address] [place], [place] [address]    Report Status PENDING  Incomplete  MRSA Next Gen by PCR, Nasal     Status: None   Collection Time: 02/16/23  4:45 AM   Specimen: Nasal Mucosa; Nasal Swab  Result Value Ref Range Status   MRSA by PCR Next Gen NOT DETECTED NOT DETECTED Final    Comment: (NOTE) The GeneXpert MRSA Assay (FDA approved for NASAL specimens only), is one component of a comprehensive MRSA colonization surveillance program. It is not intended to diagnose MRSA infection nor to guide or monitor treatment for MRSA infections. Test performance is not FDA approved in patients less than 29 years old. Performed at [facility], [address], [place], [place] [address]    Radiology Studies: No results found.  Scheduled Meds:  ascorbic acid  500 mg Oral Daily   docusate sodium  100 mg Oral BID   enoxaparin (LOVENOX) injection  40 mg Subcutaneous Q24H   feeding supplement  237 mL Oral BID BM   insulin aspart  0-15 Units Subcutaneous TID WC   insulin aspart  0-5 Units Subcutaneous QHS   insulin aspart  3 Units Subcutaneous TID WC   insulin glargine-yfgn  15 Units Subcutaneous BID   melatonin  3 mg Oral QHS   multivitamin with minerals  1 tablet Oral Daily   pantoprazole (PROTONIX) IV  40 mg Intravenous Q24H   scopolamine  1 patch Transdermal Q72H   simvastatin  10 mg Oral QPM   zinc sulfate  220 mg Oral Daily   Continuous Infusions:  cefTRIAXone (ROCEPHIN)  IV 2 g (02/15/23 1239)   lactated ringers 1,000 mL with potassium chloride 40 mEq infusion 50 mL/hr at 02/16/23 0848   promethazine (PHENERGAN) injection (IM or IVPB) 12.5 mg (02/15/23  0544)   vancomycin 1,500 mg (02/16/23 0909)     LOS: 4 days    Time spent: 50 mins    Willeen Niece, MD [facility]   If 7PM-7AM, please contact night-coverage

## 2023-02-16 NOTE — Progress Notes (Signed)
Orthopedic Tech Progress Note Patient Details:  Douglas Murphy [birth year] [id number]  Ortho Devices Type of Ortho Device: CAM walker Ortho Device/Splint Location: RLE Ortho Device/Splint Interventions: Application   Post Interventions Patient Tolerated: Well  [name] 02/16/2023, 2:13 PM
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
## 2023-02-16 NOTE — Interval H&P Note (Signed)
History and Physical Interval Note:  02/16/2023 6:27 AM  Douglas Murphy  has presented today for surgery, with the diagnosis of Abscess Left Foot.  The various methods of treatment have been discussed with the patient and family. After consideration of risks, benefits and other options for treatment, the patient has consented to  Procedure(s): LEFT FOOT 1ST RAY AMPUTATION (Left) as a surgical intervention.  The patient's history has been reviewed, patient examined, no change in status, stable for surgery.  I have reviewed the patient's chart and labs.  Questions were answered to the patient's satisfaction.     Nadara Mustard

## 2023-02-17 ENCOUNTER — Encounter (HOSPITAL_COMMUNITY): Payer: Self-pay | Admitting: Orthopedic Surgery

## 2023-02-17 LAB — CBC
HCT: 31.9 % — ABNORMAL LOW (ref 39.0–52.0)
Hemoglobin: 10.7 g/dL — ABNORMAL LOW (ref 13.0–17.0)
MCH: 29.2 pg (ref 26.0–34.0)
MCHC: 33.5 g/dL (ref 30.0–36.0)
MCV: 87.2 fL (ref 80.0–100.0)
Platelets: 450 10*3/uL — ABNORMAL HIGH (ref 150–400)
RBC: 3.66 MIL/uL — ABNORMAL LOW (ref 4.22–5.81)
RDW: 12.9 % (ref 11.5–15.5)
WBC: 14.7 10*3/uL — ABNORMAL HIGH (ref 4.0–10.5)
nRBC: 0 % (ref 0.0–0.2)

## 2023-02-17 LAB — GLUCOSE, CAPILLARY
Glucose-Capillary: 151 mg/dL — ABNORMAL HIGH (ref 70–99)
Glucose-Capillary: 142 mg/dL — ABNORMAL HIGH (ref 70–99)
Glucose-Capillary: 190 mg/dL — ABNORMAL HIGH (ref 70–99)
Glucose-Capillary: 147 mg/dL — ABNORMAL HIGH (ref 70–99)

## 2023-02-17 LAB — CULTURE, BLOOD (ROUTINE X 2)
Culture: NO GROWTH
Special Requests: ADEQUATE

## 2023-02-17 NOTE — Evaluation (Signed)
Physical Therapy Evaluation Patient Details Name: Douglas Murphy MRN: [id number] DOB: 1965/11/09 Today's Date: 02/17/2023  History of Present Illness  Patient is a 57 y.o. 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 PMH significant for DM, PVD, s/p L great toe amputation, HTN and R. Foot infection.   Clinical Impression  Upon evaluation, pt received supine with HOB elevated alert and oriented. Prior to surgery, pt was independent with all functional mobility and resides in a single level home supporting his mother. Currently pt able to perform bed mobility and transfers without physical assistance. Gait training required min guard for safety; required cues for hand placement, weight bearing precautiosn and RW placement on stairs. Endurance with gait training reduced as patient required two seated rest breaks throughout session. Educated patient on donning cam walker, precautions and exercise progression. Pt eager to return to home with family support and return back to functional baseline. Pt will benefit from skilled physical therapy in order to improve functional mobility and balance.      Recommendations for follow up therapy are one component of a multi-disciplinary discharge planning process, led by the attending physician.  Recommendations may be updated based on patient status, additional functional criteria and insurance authorization.  Follow Up Recommendations       Assistance Recommended at Discharge Intermittent Supervision/Assistance  Patient can return home with the following  A little help with walking and/or transfers;A little help with bathing/dressing/bathroom;Assist for transportation;Help with stairs or ramp for entrance    Equipment Recommendations None recommended by PT  Recommendations for Other Services       Functional Status Assessment Patient has had a recent decline in their functional status and demonstrates the ability to make significant improvements  in function in a reasonable and predictable amount of time.     Precautions / Restrictions Precautions Precautions: Fall Required Braces or Orthoses: Other Brace Other Brace: CAM boot Rt Restrictions Weight Bearing Restrictions: Yes RLE Weight Bearing: Touchdown weight bearing      Mobility  Bed Mobility Overal bed mobility: Independent             General bed mobility comments: Able to perform functional bed mobility without physical assistance.    Transfers Overall transfer level: Needs assistance Equipment used: Rolling walker (2 wheels) Transfers: Sit to/from Stand             General transfer comment: Performed transfer from EOB without physical assistance and RW support; provided verbal cue on hand placement for power up. Min guard for sit to stand from chair for saftey throughout gait training    Ambulation/Gait Ambulation/Gait assistance: Min guard Gait Distance (Feet): 100 Feet Assistive device: Rolling walker (2 wheels) Gait Pattern/deviations: Step-through pattern, Decreased step length - right, Decreased step length - left Gait velocity: Decreased Gait velocity interpretation: <1.8 ft/sec, indicate of risk for recurrent falls   General Gait Details: Pt able to progress LE with step through pattern, cued for upright posture and RW management. Cued to use UE support. Two seated rest breaks provided due to fatigue.  Stairs Stairs: Yes Stairs assistance: Min guard   Number of Stairs: 3    Wheelchair Mobility    Modified Rankin (Stroke Patients Only)       Balance Overall balance assessment: Needs assistance Sitting-balance support: No upper extremity supported Sitting balance-Leahy Scale: Good     Standing balance support: Bilateral upper extremity supported, During functional activity Standing balance-Leahy Scale: Poor Standing balance comment: Requires RW for upright balance  Pertinent  Vitals/Pain Pain Assessment Pain Assessment: 0-10 Pain Score: 1  Pain Location: Right Foot Pain Descriptors / Indicators: Sore Pain Intervention(s): Limited activity within patient's tolerance, Monitored during session    Home Living Family/patient expects to be discharged to:: Private residence Living Arrangements: Parent (Mother) Available Help at Discharge: Family;Available PRN/intermittently (Brother) Type of Home: Mobile home Home Access: Stairs to enter;Ramped entrance Entrance Stairs-Rails: Can reach both Entrance Stairs-Number of Steps: 4   Home Layout: One level;Able to live on main level with bedroom/bathroom;Full bath on main level Home Equipment: Rolling Walker (2 wheels) Additional Comments: Brother able to help with return to home and stair transfer to the house.    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       Hand Dominance   Dominant Hand: Right    Extremity/Trunk Assessment   Upper Extremity Assessment Upper Extremity Assessment: Overall WFL for tasks assessed    Lower Extremity Assessment Lower Extremity Assessment: RLE deficits/detail RLE Deficits / Details: Right ankle limited with Dorsiiflexion likely due to nerve block. All formal muscle testing normal RLE Sensation: WNL    Cervical / Trunk Assessment Cervical / Trunk Assessment: Normal  Communication   Communication: No difficulties  Cognition Arousal/Alertness: Awake/alert Behavior During Therapy: WFL for tasks assessed/performed Overall Cognitive Status: Within Functional Limits for tasks assessed                                          General Comments      Exercises     Assessment/Plan    PT Assessment Patient needs continued PT services  PT Problem List Decreased strength;Decreased range of  motion;Decreased balance;Decreased mobility;Decreased knowledge of use of DME;Pain       PT Treatment Interventions DME instruction;Gait training;Stair training;Functional mobility training;Therapeutic activities;Therapeutic exercise;Patient/family education;Balance training;Neuromuscular re-education    PT Goals (Current goals can be found in the Care Plan section)  Acute Rehab PT Goals Patient Stated Goal: Pt wants to able to walk and return home without assistance. PT Goal Formulation: With patient Time For Goal Achievement: 02/24/23 Potential to Achieve Goals: Good    Frequency Min 5X/week     Co-evaluation               AM-PAC PT "6 Clicks" Mobility  Outcome Measure Help needed turning from your back to your side while in a flat bed without using bedrails?: None Help needed moving from lying on your back to sitting on the side of a flat bed without using bedrails?: None Help needed moving to and from a bed to a chair (including a wheelchair)?: A Little Help needed standing up from a chair using your arms (e.g., wheelchair or bedside chair)?: A Little Help needed to walk in hospital room?: A Little Help needed climbing 3-5 steps with a railing? : A Little 6 Click Score: 20    End of Session Equipment Utilized During Treatment: Gait belt;Other (comment) (Cam Walker) Activity Tolerance: Patient tolerated treatment well Patient left: in chair;with call bell/phone within reach;with chair alarm set Nurse Communication: Mobility status PT Visit Diagnosis: Other abnormalities of gait and mobility (R26.89);Muscle weakness (generalized) (M62.81);Pain Pain - Right/Left: Right Pain - part of body:  (  Foot)    Time: 4098-1191 PT Time Calculation (min) (ACUTE ONLY): 25 min   Charges:   PT Evaluation $PT Eval Low Complexity: 1 Low PT Treatments $Gait Training: 8-22 mins        Christene Lye, SPT Acute Rehabilitation Services [phone] Secure chat preferred     Christene Lye 02/17/2023, 2:13 PM

## 2023-02-17 NOTE — Progress Notes (Signed)
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 Admitted for right foot ulcer with cellulitis and possible phlegmon versus abscess of right fifth toe. Orthopedics, Dr. Lajoyce Corners evaluated him on 02/14/23 and He underwent right fifth toe amputation on 02/16/23.   Assessment & Plan:   Principal Problem:   Diabetic foot infection (HCC) Active Problems:   Diabetes mellitus (HCC)   Acute kidney injury (HCC)   Cellulitis and abscess of right lower extremity   Essential hypertension   Dyslipidemia   Cutaneous abscess of left foot   Cutaneous abscess of right foot  Diabetic foot infection: Right foot ulcer with cellulitis and abscess of right fifth toe: Patient with poorly controlled diabetes with peripheral neuropathy. Prior history of left first toe amputation and has a small linear uncomplicated ulcer on the ball of the left foot at MTP. Sepsis was ruled out on admission. MRI of right forefoot: Soft tissue wound or ulceration at the lateral aspect of the forefoot at the fifth MTP joint level.  Marked surrounding soft tissue swelling and edema suggesting phlegmon or developing abscess.  Septic arthritis of fifth MTP joint not excluded.  No osteomyelitis. CRP 24.9, ESR 64, prealbumin 16.  Blood cultures x 2: NTD.  Bilateral ABIs normal. Empirically started on IV ceftriaxone, and vancomycin.  Vancomycin was discontinued on 6/3 due to nausea and vomiting. WOC team input appreciated. Orthopedics, Dr. Lajoyce Corners evaluated the patient on 02/14/23 and He underwent right fifth toe  amputation on 02/16/23.   He advised that right toe is not salvageable.  He also did bedside some deroofing of the wound for abscess drainage until upcoming surgery. Curbside discussed with ID and they indicated that it is okay to continue just the IV vancomycin and ceftriaxone and source control as planned above.   Type 2 diabetes with peripheral neuropathy, poorly controlled: Most recent HbA1c in [facility] was 9.0 in January 2019.   Patient reports that in mid May, A1c at his PCPs office may have been in the 10-11 range and states that they were discussing of trying to get it closer to 7. He reports checking CBGs twice a day with average readings about 180. Claims compliance with home Novolin 70/30, 30 units twice a day and metformin 1 g twice daily.  Holding home meds for now. Repeat A1c: 11.3.  DM coordinator input appreciated. Continue current Semglee 15 units twice daily, NovoLog moderate SSI.   Continue NovoLog mealtime 3 units 3 times daily.  Monitor closely and adjust insulin as needed. Good inpatient control.   Acute kidney injury: > Resolved. Suspect due to dehydration and infection.  Resolved after IV fluids.   Hyperlipidemia: Continue simvastatin.   Hypertension: Controlled.  His lisinopril is kept on hold by his PCP.   Normocytic anemia: May be anemia of chronic disease but will need further evaluation as outpatient.  Stable.   Nausea and vomiting: Patient reported nausea and vomiting not relieved by Zofran.  Not sure if related to metronidazole, was discontinued.   Added scopolamine patch and IV Phenergan for intractable nausea and vomiting.  Continue IV pantoprazole 40  mg daily   Body mass index is 25.47 kg/m.   DVT prophylaxis: Lovenox Code Status: Full code Family Communication: No family at bed side. Disposition Plan:    Status is: Inpatient Remains inpatient appropriate because: Admitted for right foot infection,  requiring IV antibiotics and Ortho evaluation,  underwent right fifth toe amputation.   Consultants:  Orthopaedics  Procedures:  Antimicrobials: Anti-infectives (From admission, onward)    Start     Dose/Rate Route Frequency Ordered Stop   02/16/23 1030  ceFAZolin (ANCEF) IVPB 2g/100 mL premix        2 g 200 mL/hr over 30 Minutes Intravenous On call to O.R. 02/16/23 1016 02/16/23 1156   02/16/23 0600  ceFAZolin (ANCEF) IVPB 2g/100 mL premix  Status:  Discontinued        2 g 200 mL/hr over 30 Minutes Intravenous To Short Stay 02/15/23 2344 02/16/23 1019   02/14/23 1000  vancomycin (VANCOREADY) IVPB 1500 mg/300 mL        1,500 mg 150 mL/hr over 120 Minutes Intravenous Every 12 hours 02/14/23 0958     02/13/23 1800  metroNIDAZOLE (FLAGYL) IVPB 500 mg  Status:  Discontinued        500 mg 100 mL/hr over 60 Minutes Intravenous Every 12 hours 02/12/23 1712 02/14/23 1700   02/13/23 1500  vancomycin (VANCOREADY) IVPB 1500 mg/300 mL  Status:  Discontinued        1,500 mg 150 mL/hr over 120 Minutes Intravenous Every 24 hours 02/12/23 1529 02/14/23 0958   02/13/23 1200  cefTRIAXone (ROCEPHIN) 2 g in sodium chloride 0.9 % 100 mL IVPB        2 g 200 mL/hr over 30 Minutes Intravenous Every 24 hours 02/12/23 1654     02/12/23 1700  metroNIDAZOLE (FLAGYL) IVPB 500 mg  Status:  Discontinued        500 mg 100 mL/hr over 60 Minutes Intravenous Every 8 hours 02/12/23 1654 02/12/23 1712   02/12/23 1515  vancomycin (VANCOREADY) IVPB 2000 mg/400 mL        2,000 mg 200 mL/hr over 120 Minutes Intravenous  Once 02/12/23 1514 02/12/23 1815   02/12/23 1500  vancomycin (VANCOCIN) IVPB 1000 mg/200 mL premix  Status:  Discontinued        1,000 mg 200 mL/hr over 60 Minutes Intravenous  Once 02/12/23 1458 02/12/23 1514   02/12/23 1500  cefTRIAXone (ROCEPHIN) 2 g in sodium chloride 0.9 % 100 mL IVPB        2 g 200 mL/hr over 30 Minutes Intravenous  Once 02/12/23 1458 02/12/23 1607      Subjective: Patient was seen and examined at bedside. Overnight  events noted. Patient underwent right fifth toe amputation. He reports pain is reasonably controlled.  wound VAC draining well.  Objective: Vitals:   02/16/23 1613 02/16/23 2027 02/17/23 0348 02/17/23 0749  BP: (!) 161/90 124/75 (!) 162/95 (!) 152/74  Pulse: 84 81 84 78  Resp:  19 20 18   Temp: 98.8 F (37.1 C) 98.9 F (37.2 C) 98.4 F (36.9 C) 98 F (36.7 C)  TempSrc: Oral Oral Oral Oral  SpO2: 99% 99% 99% 99%  Weight:      Height:        Intake/Output Summary (Last 24 hours) at 02/17/2023 1250 Last data filed at 02/17/2023 1100 Gross per 24 hour  Intake 1021.97 ml  Output 2225 ml  Net -1203.03 ml   Filed Weights   02/12/23 1353 02/12/23 1736  Weight: 83.9 kg 85.2 kg  Examination:  General exam: Appears comfortable, calm, deconditioned, not in any distress. Respiratory system: CTA bilaterally. Respiratory effort normal.  RR 14 Cardiovascular system: S1 & S2 heard, RRR. No JVD, murmurs, rubs, gallops or clicks. Gastrointestinal system: Abdomen is soft, non tender, non distended, bowel sounds present Central nervous system: Alert and oriented x 3. No focal neurological deficits. Extremities: S/p right fifth toe amputation, wound VAC noted. Skin: No rashes, lesions or ulcers Psychiatry: Judgement and insight appear normal. Mood & affect appropriate.     Data Reviewed: I have personally reviewed following labs and imaging studies  CBC: Recent Labs  Lab 02/12/23 1355 02/13/23 0200 02/14/23 0302 02/15/23 0348 02/16/23 0258 02/17/23 0944  WBC 18.0* 16.8* 16.9* 17.7* 15.2* 14.7*  NEUTROABS 15.2*  --   --   --   --   --   HGB 11.7* 10.4* 10.0* 10.8* 10.2* 10.7*  HCT 35.4* 32.1* 30.1* 31.7* 31.3* 31.9*  MCV 88.9 89.4 87.8 85.4 88.4 87.2  PLT 270 255 292 345 373 450*   Basic Metabolic Panel: Recent Labs  Lab 02/12/23 1355 02/13/23 0200 02/14/23 0302 02/15/23 0348 02/16/23 0258  NA 132* 135 134* 134* 137  K 4.1 3.6 3.8 3.5 3.7  CL 99 105 101 99 105  CO2 23  21* 23 23 23   GLUCOSE 372* 201* 146* 149* 85  BUN 22* 18 7 9 13   CREATININE 1.50* 1.10 0.89 0.93 0.90  CALCIUM 8.6* 8.1* 8.2* 8.4* 8.3*   GFR: Estimated Creatinine Clearance: 103 mL/min (by C-G formula based on SCr of 0.9 mg/dL). Liver Function Tests: Recent Labs  Lab 02/12/23 1355  AST 16  ALT 17  ALKPHOS 73  BILITOT 0.6  PROT 7.2  ALBUMIN 3.4*   No results for input(s): "LIPASE", "AMYLASE" in the last 168 hours. No results for input(s): "AMMONIA" in the last 168 hours. Coagulation Profile: Recent Labs  Lab 02/12/23 1628  INR 1.2   Cardiac Enzymes: No results for input(s): "CKTOTAL", "CKMB", "CKMBINDEX", "TROPONINI" in the last 168 hours. BNP (last 3 results) No results for input(s): "PROBNP" in the last 8760 hours. HbA1C: No results for input(s): "HGBA1C" in the last 72 hours. CBG: Recent Labs  Lab 02/16/23 1213 02/16/23 1611 02/16/23 2100 02/17/23 0742 02/17/23 1132  GLUCAP 132* 157* 118* 151* 142*   Lipid Profile: No results for input(s): "CHOL", "HDL", "LDLCALC", "TRIG", "CHOLHDL", "LDLDIRECT" in the last 72 hours. Thyroid Function Tests: No results for input(s): "TSH", "T4TOTAL", "FREET4", "T3FREE", "THYROIDAB" in the last 72 hours. Anemia Panel: No results for input(s): "VITAMINB12", "FOLATE", "FERRITIN", "TIBC", "IRON", "RETICCTPCT" in the last 72 hours. Sepsis Labs: Recent Labs  Lab 02/12/23 1355  LATICACIDVEN 2.0*    Recent Results (from the past 240 hour(s))  Blood Cultures x 2 sites     Status: None   Collection Time: 02/12/23  2:03 PM   Specimen: BLOOD  Result Value Ref Range Status   Specimen Description BLOOD LEFT ANTECUBITAL  Final   Special Requests   Final    BOTTLES DRAWN AEROBIC AND ANAEROBIC Blood Culture adequate volume   Culture   Final    NO GROWTH 5 DAYS Performed at [facility], [address], [place], [place] [address]    Report Status 02/17/2023 FINAL  Final  Blood Cultures x 2 sites     Status: None    Collection Time: 02/12/23  3:57 PM   Specimen: BLOOD LEFT FOREARM  Result Value Ref Range Status   Specimen Description BLOOD LEFT  FOREARM  Final   Special Requests   Final    BOTTLES DRAWN AEROBIC AND ANAEROBIC Blood Culture results may not be optimal due to an excessive volume of blood received in culture bottles   Culture   Final    NO GROWTH 5 DAYS Performed at [facility], [address] [place], [place] [address]    Report Status 02/17/2023 FINAL  Final  MRSA Next Gen by PCR, Nasal     Status: None   Collection Time: 02/16/23  4:45 AM   Specimen: Nasal Mucosa; Nasal Swab  Result Value Ref Range Status   MRSA by PCR Next Gen NOT DETECTED NOT DETECTED Final    Comment: (NOTE) The GeneXpert MRSA Assay (FDA approved for NASAL specimens only), is one component of a comprehensive MRSA colonization surveillance program. It is not intended to diagnose MRSA infection nor to guide or monitor treatment for MRSA infections. Test performance is not FDA approved in patients less than 72 years old. Performed at [facility], [address], [place], [place] [address]   Aerobic/Anaerobic Culture w Gram Stain (surgical/deep wound)     Status: None (Preliminary result)   Collection Time: 02/16/23 11:36 AM   Specimen: Path Tissue  Result Value Ref Range Status   Specimen Description TISSUE RIGHT TOE  Final   Special Requests NONE  Final   Gram Stain   Final    FEW WBC PRESENT, PREDOMINANTLY PMN RARE GRAM POSITIVE COCCI IN PAIRS    Culture   Final    CULTURE REINCUBATED FOR BETTER GROWTH Performed at [facility], [address], [place], [place] [address]    Report Status PENDING  Incomplete   Radiology Studies: No results found.  Scheduled Meds:  ascorbic acid  500 mg Oral Daily   docusate sodium  100 mg Oral BID   enoxaparin (LOVENOX) injection  40 mg Subcutaneous Q24H   feeding supplement  237 mL Oral BID BM   insulin aspart  0-15 Units Subcutaneous TID WC    insulin aspart  0-5 Units Subcutaneous QHS   insulin aspart  3 Units Subcutaneous TID WC   insulin glargine-yfgn  15 Units Subcutaneous BID   melatonin  3 mg Oral QHS   multivitamin with minerals  1 tablet Oral Daily   nutrition supplement (JUVEN)  1 packet Oral BID BM   pantoprazole  40 mg Oral Daily   scopolamine  1 patch Transdermal Q72H   simvastatin  10 mg Oral QPM   zinc sulfate  220 mg Oral Daily   Continuous Infusions:  sodium chloride 75 mL/hr at 02/16/23 1721   cefTRIAXone (ROCEPHIN)  IV 2 g (02/15/23 1239)   lactated ringers 1,000 mL with potassium chloride 40 mEq infusion 50 mL/hr at 02/17/23 0606   magnesium sulfate bolus IVPB     promethazine (PHENERGAN) injection (IM or IVPB) 12.5 mg (02/17/23 1239)   vancomycin 1,500 mg (02/17/23 1101)     LOS: 5 days    Time spent: 35 mins    Willeen Niece, MD [facility]   If 7PM-7AM, please contact night-coverage

## 2023-02-17 NOTE — Progress Notes (Signed)
Pharmacy Antibiotic Note  Douglas Murphy is a 57 y.o. male admitted on 02/12/2023 with sepsis/cellulitis of big toe.  Pharmacy has been consulted for vancomycin dosing. Patient presented with a right toe infection, open wound that is red and painful. MRI showed fluid collection around L foot great toe MTP joint and possible abscess surrounding R foot 5th MTP joint - no evidence of osteomyelitis. S/p R toe amputation on 6/5.  Scr improved to baseline. WBC 18 >> 14. Afebrile.  Plan: Continue vancomycin 1500mg  IV q12 hours (eAUC 538, Scr 0.89, Vd 0.72) Continue ceftriaxone 2g IV q24 hours and metronidazole 500mg  IV q12 hours per MD F/u R toe tissue culture - planning to transition to PO antibiotics to complete 3-4 weeks once cultures finalized  Height: 6' (182.9 cm) Weight: 85.2 kg (187 lb 13.3 oz) IBW/kg (Calculated) : 77.6  Temp (24hrs), Avg:98.6 F (37 C), Min:98 F (36.7 C), Max:99.1 F (37.3 C)  Recent Labs  Lab 02/12/23 1355 02/13/23 0200 02/14/23 0302 02/15/23 0348 02/16/23 0258 02/17/23 0944  WBC 18.0* 16.8* 16.9* 17.7* 15.2* 14.7*  CREATININE 1.50* 1.10 0.89 0.93 0.90  --   LATICACIDVEN 2.0*  --   --   --   --   --      Estimated Creatinine Clearance: 103 mL/min (by C-G formula based on SCr of 0.9 mg/dL).    Allergies  Allergen Reactions   Other Anaphylaxis and Other (See Comments)    [place] nuts     Antimicrobials this admission: Ceftriaxone 6/1 >>  Vancomycin 6/1 >>  Metronidazole 6/2 >> 6/3  Microbiology results: 6/1 BCx: ngtd 6/5 R toe tissue: GPC   Thank you for allowing pharmacy to be a part of this patient's care.  Rexford Maus, PharmD, BCPS 02/17/2023 12:35 PM

## 2023-02-18 ENCOUNTER — Other Ambulatory Visit: Payer: Self-pay

## 2023-02-18 ENCOUNTER — Other Ambulatory Visit (HOSPITAL_COMMUNITY): Payer: Self-pay

## 2023-02-18 LAB — BASIC METABOLIC PANEL
Anion gap: 9 (ref 5–15)
BUN: 12 mg/dL (ref 6–20)
CO2: 24 mmol/L (ref 22–32)
Calcium: 8.4 mg/dL — ABNORMAL LOW (ref 8.9–10.3)
Chloride: 101 mmol/L (ref 98–111)
Creatinine, Ser: 0.84 mg/dL (ref 0.61–1.24)
GFR, Estimated: >60 mL/min (ref >60)
Glucose, Bld: 122 mg/dL — ABNORMAL HIGH (ref 70–99)
Potassium: 3.8 mmol/L (ref 3.5–5.1)
Sodium: 134 mmol/L — ABNORMAL LOW (ref 135–145)

## 2023-02-18 LAB — GLUCOSE, CAPILLARY
Glucose-Capillary: 121 mg/dL — ABNORMAL HIGH (ref 70–99)
Glucose-Capillary: 135 mg/dL — ABNORMAL HIGH (ref 70–99)

## 2023-02-18 LAB — CBC
HCT: 27.6 % — ABNORMAL LOW (ref 39.0–52.0)
Hemoglobin: 9.5 g/dL — ABNORMAL LOW (ref 13.0–17.0)
MCH: 30.4 pg (ref 26.0–34.0)
MCHC: 34.4 g/dL (ref 30.0–36.0)
MCV: 88.2 fL (ref 80.0–100.0)
Platelets: 389 10*3/uL (ref 150–400)
RBC: 3.13 MIL/uL — ABNORMAL LOW (ref 4.22–5.81)
RDW: 12.9 % (ref 11.5–15.5)
WBC: 13.2 10*3/uL — ABNORMAL HIGH (ref 4.0–10.5)
nRBC: 0 % (ref 0.0–0.2)

## 2023-02-18 LAB — PHOSPHORUS: Phosphorus: 3.2 mg/dL (ref 2.5–4.6)

## 2023-02-18 LAB — MAGNESIUM: Magnesium: 1.4 mg/dL — ABNORMAL LOW (ref 1.7–2.4)

## 2023-02-18 MED ORDER — OXYCODONE HCL 5 MG PO TABS
5.0000 mg | ORAL_TABLET | ORAL | 0 refills | Status: AC | PRN
  Filled 2023-02-18: qty 8, 2d supply, fill #0

## 2023-02-18 MED ORDER — MAGNESIUM SULFATE 2 GM/50ML IV SOLN
2.0000 g | Freq: Once | INTRAVENOUS | Status: AC
  Administered 2023-02-18: 2 g via INTRAVENOUS

## 2023-02-18 NOTE — Discharge Instructions (Signed)
Advised to follow-up with primary care physician in 1 week. Advised to follow-up with Dr. Lajoyce Corners in 1 week. Patient is being discharged with wound VAC.

## 2023-02-18 NOTE — Progress Notes (Signed)
Physical Therapy Treatment Patient Details Name: Douglas Murphy MRN: [id number] DOB: 01-May-1966 Today's Date: 02/18/2023   History of Present Illness Patient is a 57 y.o. 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 PMH significant for DM, PVD, s/p L great toe amputation, HTN and R. Foot infection.    PT Comments    Excellent progress. Mod I with transfers. Appropriately using RW with gait following training today and maintains TDWB on Rt at all times while wearing cam boot. Educated on safety and activity progression. All questions answered. Eager to return home, and has help from brother. Will follow until d/c.   Recommendations for follow up therapy are one component of a multi-disciplinary discharge planning process, led by the attending physician.  Recommendations may be updated based on patient status, additional functional criteria and insurance authorization.  Follow Up Recommendations       Assistance Recommended at Discharge Intermittent Supervision/Assistance  Patient can return home with the following A little help with walking and/or transfers;A little help with bathing/dressing/bathroom;Assist for transportation;Help with stairs or ramp for entrance   Equipment Recommendations  None recommended by PT    Recommendations for Other Services       Precautions / Restrictions Precautions Precautions: Fall Required Braces or Orthoses: Other Brace Other Brace: CAM boot Rt Restrictions Weight Bearing Restrictions: Yes RLE Weight Bearing: Touchdown weight bearing     Mobility  Bed Mobility Overal bed mobility: Independent             General bed mobility comments: Able to perform functional bed mobility without physical assistance.    Transfers Overall transfer level: Modified independent Equipment used: Rolling walker (2 wheels) Transfers: Sit to/from Stand             General transfer comment: Mod I with good control using RW for support and  maintaining TDWB on RLE.    Ambulation/Gait Ambulation/Gait assistance: Supervision Gait Distance (Feet): 110 Feet Assistive device: Rolling walker (2 wheels) Gait Pattern/deviations: Decreased step length - right, Decreased step length - left, Step-through pattern, Step-to pattern, Antalgic Gait velocity: Decreased Gait velocity interpretation: <1.8 ft/sec, indicate of risk for recurrent falls   General Gait Details: Tolerated gait training well with minor cues for sequencing, symmetry, and walker proximity for efficency and safety. Maintains TDWB on Rt with cam boot. No LOB noted during bout.   Stairs             Wheelchair Mobility    Modified Rankin (Stroke Patients Only)       Balance Overall balance assessment: Needs assistance Sitting-balance support: No upper extremity supported Sitting balance-Leahy Scale: Good     Standing balance support: Bilateral upper extremity supported, During functional activity Standing balance-Leahy Scale: Poor Standing balance comment: Requires RW for upright balance                            Cognition Arousal/Alertness: Awake/alert Behavior During Therapy: WFL for tasks assessed/performed Overall Cognitive Status: Within Functional Limits for tasks assessed                                          Exercises General Exercises - Lower Extremity Ankle Circles/Pumps: AROM, Both, 10 reps, Seated Quad Sets: Strengthening, Both, 10 reps, Seated Short Arc Quad: Strengthening, Right, 5 reps, Seated    General Comments General comments (skin integrity,  edema, etc.): Reviewed precautions, LE exercises, symptom awareness, safety with mobility.      Pertinent Vitals/Pain Pain Assessment Pain Assessment: No/denies pain Pain Intervention(s): Monitored during session, Repositioned    Home Living                          Prior Function            PT Goals (current goals can now be found  in the care plan section) Acute Rehab PT Goals Patient Stated Goal: Pt wants to able to walk and return home without assistance. PT Goal Formulation: With patient Time For Goal Achievement: 02/24/23 Potential to Achieve Goals: Good Progress towards PT goals: Progressing toward goals    Frequency    Min 5X/week      PT Plan Current plan remains appropriate    Co-evaluation              AM-PAC PT "6 Clicks" Mobility   Outcome Measure  Help needed turning from your back to your side while in a flat bed without using bedrails?: None Help needed moving from lying on your back to sitting on the side of a flat bed without using bedrails?: None Help needed moving to and from a bed to a chair (including a wheelchair)?: None Help needed standing up from a chair using your arms (e.g., wheelchair or bedside chair)?: None Help needed to walk in hospital room?: A Little Help needed climbing 3-5 steps with a railing? : A Little 6 Click Score: 22    End of Session Equipment Utilized During Treatment: Gait belt;Other (comment) (Cam Walker) Activity Tolerance: Patient tolerated treatment well Patient left: in chair;with call bell/phone within reach;with chair alarm set Nurse Communication: Mobility status PT Visit Diagnosis: Other abnormalities of gait and mobility (R26.89);Muscle weakness (generalized) (M62.81);Pain Pain - Right/Left: Right Pain - part of body:  (Foot)     Time: 1610-9604 PT Time Calculation (min) (ACUTE ONLY): 18 min  Charges:  $Gait Training: 8-22 mins                     Kathlyn Sacramento, PT, DPT [facility]  Rehabilitation Services Physical Therapist Office: [phone] Website: [email or web address]    Berton Mount 02/18/2023, 1:18 PM

## 2023-02-18 NOTE — Discharge Summary (Addendum)
Physician Discharge Summary  Douglas Murphy:[id number] DOB: [birth year] DOA: 02/12/2023  PCP: [name], MD  Admit date: 02/12/2023  Discharge date: 02/18/2023  Admitted From: Home.  Disposition:  Home  Recommendations for Outpatient Follow-up:  Follow up with PCP in 1-2 weeks. Please obtain BMP/CBC in one week. Advised to follow-up with Dr. Lajoyce Corners in 1 week. Patient is being discharged with wound VAC.  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 Admitted for right foot ulcer with cellulitis and possible phlegmon versus abscess of right fifth toe. Orthopedics, Dr. Lajoyce Corners evaluated him on 02/14/23 and He underwent right fifth toe amputation on 02/16/23.  Patient tolerated well.  The margins were clean.  PT and OT recommended outpatient PT.  Discussed with Dr. Lajoyce Corners who has cleared the patient for discharge without antibiotics.  He states margins were clear and he will follow-up on culture and see patient within 1 week and will start antibiotics if needed.  Patient is being discharged home with wound VAC.   Discharge Diagnoses:  Principal Problem:   Diabetic foot infection (HCC) Active Problems:   Diabetes mellitus (HCC)   Acute kidney injury (HCC)   Cellulitis and abscess of right lower extremity   Essential hypertension   Dyslipidemia   Cutaneous abscess of left foot   Cutaneous abscess of right foot  Diabetic foot infection: Right foot ulcer with cellulitis and abscess of right fifth toe: Patient with poorly  controlled diabetes with peripheral neuropathy. Prior history of left first toe amputation and has a small linear uncomplicated ulcer on the ball of the left foot at MTP. Sepsis was ruled out on admission. MRI of right forefoot: Soft tissue wound or ulceration at the lateral aspect of the forefoot at the fifth MTP joint level.  Marked surrounding soft tissue swelling and edema suggesting phlegmon or developing abscess.  Septic arthritis of fifth MTP joint not excluded.  No osteomyelitis. CRP 24.9, ESR 64, prealbumin 16.  Blood cultures x 2: NTD.  Bilateral ABIs normal. Empirically started on IV ceftriaxone, and vancomycin.  Vancomycin was discontinued on 6/3 due to nausea and vomiting. WOC team input appreciated. Orthopedics, Dr. Lajoyce Corners evaluated the patient on 02/14/23 and He underwent right fifth toe amputation on 02/16/23.   Tissue cultures were pending.  Dr. Lajoyce Corners states the margins were clear during surgery, He states patient can be discharged with wound VAC. No antibiotics at this time. He will follow-up outpatient within 1 week and will follow-up on culture and decide about antibiotics. Patient is being discharged home.   Type 2 diabetes with peripheral neuropathy, poorly controlled: Most recent HbA1c in [facility] was 9.0 in January 2019.   Patient reports that in mid May, A1c at his PCPs office may have been in the 10-11 range and states that they were discussing of trying to get it closer to 7. He reports checking CBGs twice a day with average readings about 180. Claims compliance with home Novolin 70/30, 30 units twice a day and metformin 1 g twice daily.  Repeat A1c: 11.3.  DM coordinator input appreciated. Continue current Semglee 15  units twice daily, NovoLog moderate SSI.   Continue NovoLog mealtime 3 units 3 times daily.  Monitor closely and adjust insulin as needed. Good inpatient control.   Acute kidney injury: > Resolved. Suspect due to dehydration and infection.  Resolved after IV  fluids.   Hyperlipidemia: Continue simvastatin.   Hypertension: Controlled. Lisinopril resumed.   Normocytic anemia: May be anemia of chronic disease but will need further evaluation as outpatient.  Stable.   Nausea and vomiting: Patient reported nausea and vomiting not relieved by Zofran.  Not sure if related to metronidazole, was discontinued.   Added scopolamine patch and IV Phenergan for intractable nausea and vomiting.  Continue IV pantoprazole 40 mg daily   Body mass index is 25.47 kg/m.  Discharge Instructions  Discharge Instructions     Call MD for:  difficulty breathing, headache or visual disturbances   Complete by: As directed    Call MD for:  persistant dizziness or light-headedness   Complete by: As directed    Call MD for:  persistant nausea and vomiting   Complete by: As directed    Diet - low sodium heart healthy   Complete by: As directed    Diet Carb Modified   Complete by: As directed    Discharge instructions   Complete by: As directed    Advised to follow-up with primary care physician in 1 week. Advised to follow-up with Dr. Lajoyce Corners in 1 week. Patient is being discharged with wound VAC.   Discharge wound care:   Complete by: As directed    Follow-up Dr. Lajoyce Corners  in 1 week.   Increase activity slowly   Complete by: As directed    Negative Pressure Wound Therapy - Incisional   Complete by: As directed       Allergies as of 02/18/2023       Reactions   Other Anaphylaxis, Other (See Comments)   [place] nuts        Medication List     TAKE these medications    insulin NPH-regular Human (70-30) 100 UNIT/ML injection Inject 30 Units into the skin 2 (two) times daily.   lisinopril 5 MG tablet Commonly known as: ZESTRIL Take 5 mg by mouth every evening.   metFORMIN 1000 MG tablet Commonly known as: GLUCOPHAGE Take 1,000 mg by mouth 2 (two) times daily.   oxyCODONE 5 MG immediate release tablet Commonly known as: Oxy IR/ROXICODONE Take  1 tablet (5 mg total) by mouth every 4 (four) hours as needed for up to 3 days for moderate pain (pain score 4-6).   simvastatin 10 MG tablet Commonly known as: ZOCOR Take 10 mg by mouth every evening.               Discharge Care Instructions  (From admission, onward)           Start     Ordered   02/18/23 0000  Discharge wound care:       Comments: Follow-up Dr. Lajoyce Corners  in 1 week.   02/18/23 1109            Follow-up Information     [name], MD Follow up.   Specialty: Internal Medicine Contact information: [address] [place] [address] [phone]         Nadara Mustard, MD Follow up in 1 week(s).   Specialty: Orthopedic Surgery Contact information: [address] [place] [address] [phone]  Allergies  Allergen Reactions   Other Anaphylaxis and Other (See Comments)    [place] nuts     Consultations: ORTHOPEADICS   Procedures/Studies: VAS [place] ABI WITH/WO TBI  Result Date: 02/13/2023  LOWER EXTREMITY DOPPLER STUDY Patient Name:  Douglas Murphy  Date of Exam:   02/13/2023 Medical Rec #: [id number]             Accession #:    [id number] Date of Birth: 1966-03-25              Patient Gender: M Patient Age:   57 years Exam Location:  [facility] Procedure:      VAS [place] ABI WITH/WO TBI Referring Phys: Victorino Dike YATES --------------------------------------------------------------------------------  Indications: Ulceration on both feet. High Risk Factors: Hypertension, hyperlipidemia, Diabetes.  Comparison Study: No prior study on file Performing Technologist: Sherren Kerns RVS  Examination Guidelines: A complete evaluation includes at minimum, Doppler waveform signals and systolic blood pressure reading at the level of bilateral brachial, anterior tibial, and posterior tibial arteries, when vessel segments are accessible. Bilateral testing is considered an integral part of a complete examination.  Photoelectric Plethysmograph (PPG) waveforms and toe systolic pressure readings are included as required and additional duplex testing as needed. Limited examinations for reoccurring indications may be performed as noted.  ABI Findings: +---------+------------------+-----+---------+--------+ Right    Rt Pressure (mmHg)IndexWaveform Comment  +---------+------------------+-----+---------+--------+ Brachial 127                    triphasic         +---------+------------------+-----+---------+--------+ PTA      180               1.42 triphasic         +---------+------------------+-----+---------+--------+ DP       163               1.28 triphasic         +---------+------------------+-----+---------+--------+ Ernst Spell               0.97 Normal            +---------+------------------+-----+---------+--------+ +---------+------------------+-----+---------+-------+ Left     Lt Pressure (mmHg)IndexWaveform Comment +---------+------------------+-----+---------+-------+ Brachial 126                    triphasic        +---------+------------------+-----+---------+-------+ PTA      173               1.36 triphasic        +---------+------------------+-----+---------+-------+ DP       157               1.24 triphasic        +---------+------------------+-----+---------+-------+ Great Toe109               0.86 Normal           +---------+------------------+-----+---------+-------+ +-------+-----------+-----------+------------+------------+ ABI/TBIToday's ABIToday's TBIPrevious ABIPrevious TBI +-------+-----------+-----------+------------+------------+ Right  1.42       0.97                                +-------+-----------+-----------+------------+------------+ Left   1.36       0.86                                +-------+-----------+-----------+------------+------------+  Summary: Right: Resting right ankle-brachial index is within normal  range.  The right toe-brachial index is normal. Left: Resting left ankle-brachial index is within normal range. The left toe-brachial index is normal. *See table(s) above for measurements and observations.  Electronically signed by Sherald Hess MD on 02/13/2023 at 11:32:52 AM.    Final    MR FOOT LEFT W WO CONTRAST  Result Date: 02/13/2023 CLINICAL DATA:  Foot swelling, diabetic, osteomyelitis suspected, no prior imaging EXAM: MRI OF THE LEFT FOREFOOT WITHOUT AND WITH CONTRAST TECHNIQUE: Multiplanar, multisequence MR imaging of the left forefoot was performed both before and after administration of intravenous contrast. CONTRAST:  8mL GADAVIST GADOBUTROL 1 MMOL/ML IV SOLN COMPARISON:  X-ray 05/31/2021 FINDINGS: Bones/Joint/Cartilage Prior amputation of the left great toe at the first MTP joint level. Remaining osseous structures are intact. No fracture or dislocation. No bone marrow edema or periostitis. No erosions. No bone lesion. Degenerative changes of the hallux sesamoid complex. Elsewhere, no significant arthropathy. No sizable joint effusion Ligaments Intact Lisfranc ligament. Capsular thickening of the second MTP joint. No collateral ligament disruption. Muscles and Tendons Mild edema-like signal of the foot musculature favors denervation changes. No tenosynovitis. Soft tissues There is some skin thickening the plantar aspect of the medial forefoot. No soft tissue edema or fluid collection. IMPRESSION: LEFT FOREFOOT: 1. No acute bony abnormality. Specifically, no evidence of acute osteomyelitis. 2. Prior amputation of the left great toe at the first MTP joint level. 3. Degenerative changes at the hallux-sesamoid complex. Electronically Signed   By: Duanne Guess D.O.   On: 02/13/2023 09:28   MR FOOT RIGHT W WO CONTRAST  Result Date: 02/13/2023 CLINICAL DATA:  Foot swelling, diabetic, osteomyelitis suspected, no prior imaging EXAM: MRI OF THE RIGHT FOREFOOT WITHOUT AND WITH CONTRAST TECHNIQUE:  Multiplanar, multisequence MR imaging of the right forefoot was performed before and after the administration of intravenous contrast. CONTRAST:  8mL GADAVIST GADOBUTROL 1 MMOL/ML IV SOLN COMPARISON:  X-ray 02/12/2023 FINDINGS: Bones/Joint/Cartilage No acute fracture. No dislocation. No erosion. No bone marrow edema or marrow replacement. Joint spaces are relatively well preserved. Trace fifth MTP joint effusion. Ligaments Intact Lisfranc ligament.  Intact collateral ligaments. Muscles and Tendons Diffuse edema-like signal of the foot musculature which may represent changes related to denervation and/or myositis. Intact flexor and extensor tendons. No well-defined to the synovial fluid collection. Soft tissues Soft tissue wound or ulceration at the lateral aspect of the forefoot at the 5th MTP joint level. Marked surrounding soft tissue swelling and edema. Somewhat ill-defined fluid collection surrounding the 5th MTP joint measuring approximately 3.3 x 2.7 x 3.0 cm (series 6, image 30), suggesting phlegmon or developing abscess. There are several foci of susceptibility artifact within this region compatible with foci of soft tissue gas, as seen radiographically. Extensive subcutaneous edema throughout the dorsum of the foot. No additional collections. IMPRESSION: RIGHT FOREFOOT: 1. Soft tissue wound or ulceration at the lateral aspect of the forefoot at the 5th MTP joint level. Marked surrounding soft tissue swelling and edema. Somewhat ill-defined fluid and air collection surrounding the 5th MTP joint measuring approximately 3.3 x 2.7 x 3.0 cm, suggesting phlegmon or developing abscess. 2. Trace fifth MTP joint effusion. Septic arthritis not excluded. 3. No evidence of osteomyelitis. 4. Diffuse edema-like signal of the foot musculature which may represent changes related to denervation and/or myositis. Electronically Signed   By: Duanne Guess D.O.   On: 02/13/2023 09:22   DG Foot Complete Right  Result  Date: 02/12/2023 CLINICAL DATA:  Infection EXAM: RIGHT FOOT COMPLETE - 3 VIEW COMPARISON:  None Available. FINDINGS: No evidence of fracture or dislocation. No osseous destruction. Mild degenerative changes of the IP joint of the great toe. Soft tissue edema of the lateral forefoot with associated soft tissue ulceration and tiny locule of soft tissue gas. Vascular calcifications. IMPRESSION: 1. Soft tissue edema of the lateral forefoot with associated soft tissue ulceration and tiny locule of soft tissue gas, concerning for infection with gas-forming organism. 2. No radiographic evidence of osteomyelitis. Electronically Signed   By: Allegra Lai M.D.   On: 02/12/2023 15:32     Subjective: Patient was seen and examined at bedside.  Overnight events noted.   Patient underwent s/p right fifth toe amputation.  POD #3.  Patient is cleared from Ortho to be discharged with wound VAC.  Discharge Exam: Vitals:   02/18/23 0626 02/18/23 0845  BP: (!) 150/90 138/80  Pulse: 75 72  Resp:  18  Temp:  98.6 F (37 C)  SpO2: 96% 98%   Vitals:   02/17/23 0749 02/17/23 2117 02/18/23 0626 02/18/23 0845  BP: (!) 152/74 (!) 165/97 (!) 150/90 138/80  Pulse: 78 87 75 72  Resp: 18   18  Temp: 98 F (36.7 C) 99 F (37.2 C)  98.6 F (37 C)  TempSrc: Oral Oral  Oral  SpO2: 99% 100% 96% 98%  Weight:      Height:        General: Pt is alert, awake, not in acute distress Cardiovascular: RRR, S1/S2 +, no rubs, no gallops Respiratory: CTA bilaterally, no wheezing, no rhonchi Abdominal: Soft, NT, ND, bowel sounds + Extremities: no edema, no cyanosis, s/p right fifth toe amputation, POD #2    The results of significant diagnostics from this hospitalization (including imaging, microbiology, ancillary and laboratory) are listed below for reference.     Microbiology: Recent Results (from the past 240 hour(s))  Blood Cultures x 2 sites     Status: None   Collection Time: 02/12/23  2:03 PM   Specimen: BLOOD   Result Value Ref Range Status   Specimen Description BLOOD LEFT ANTECUBITAL  Final   Special Requests   Final    BOTTLES DRAWN AEROBIC AND ANAEROBIC Blood Culture adequate volume   Culture   Final    NO GROWTH 5 DAYS Performed at [facility], [address], [place], [place] [address]    Report Status 02/17/2023 FINAL  Final  Blood Cultures x 2 sites     Status: None   Collection Time: 02/12/23  3:57 PM   Specimen: BLOOD LEFT FOREARM  Result Value Ref Range Status   Specimen Description BLOOD LEFT FOREARM  Final   Special Requests   Final    BOTTLES DRAWN AEROBIC AND ANAEROBIC Blood Culture results may not be optimal due to an excessive volume of blood received in culture bottles   Culture   Final    NO GROWTH 5 DAYS Performed at [facility], [address] [place], [place] [address]    Report Status 02/17/2023 FINAL  Final  MRSA Next Gen by PCR, Nasal     Status: None   Collection Time: 02/16/23  4:45 AM   Specimen: Nasal Mucosa; Nasal Swab  Result Value Ref Range Status   MRSA by PCR Next Gen NOT DETECTED NOT DETECTED Final    Comment: (NOTE) The GeneXpert MRSA Assay (FDA approved for NASAL specimens only), is one component of a comprehensive MRSA colonization surveillance program. It is not intended to diagnose MRSA infection  nor to guide or monitor treatment for MRSA infections. Test performance is not FDA approved in patients less than 46 years old. Performed at [facility], [address], [place], [place] [address]   Aerobic/Anaerobic Culture w Gram Stain (surgical/deep wound)     Status: None (Preliminary result)   Collection Time: 02/16/23 11:36 AM   Specimen: Path Tissue  Result Value Ref Range Status   Specimen Description TISSUE RIGHT TOE  Final   Special Requests NONE  Final   Gram Stain   Final    FEW WBC PRESENT, PREDOMINANTLY PMN RARE GRAM POSITIVE COCCI IN PAIRS    Culture   Final    CULTURE REINCUBATED FOR BETTER  GROWTH Performed at [facility], [address], [place], [place] [address]    Report Status PENDING  Incomplete     Labs: BNP (last 3 results) No results for input(s): "BNP" in the last 8760 hours. Basic Metabolic Panel: Recent Labs  Lab 02/13/23 0200 02/14/23 0302 02/15/23 0348 02/16/23 0258 02/18/23 0751  NA 135 134* 134* 137 134*  K 3.6 3.8 3.5 3.7 3.8  CL 105 101 99 105 101  CO2 21* 23 23 23 24   GLUCOSE 201* 146* 149* 85 122*  BUN 18 7 9 13 12   CREATININE 1.10 0.89 0.93 0.90 0.84  CALCIUM 8.1* 8.2* 8.4* 8.3* 8.4*  MG  --   --   --   --  1.4*  PHOS  --   --   --   --  3.2   Liver Function Tests: Recent Labs  Lab 02/12/23 1355  AST 16  ALT 17  ALKPHOS 73  BILITOT 0.6  PROT 7.2  ALBUMIN 3.4*   No results for input(s): "LIPASE", "AMYLASE" in the last 168 hours. No results for input(s): "AMMONIA" in the last 168 hours. CBC: Recent Labs  Lab 02/12/23 1355 02/13/23 0200 02/14/23 0302 02/15/23 0348 02/16/23 0258 02/17/23 0944 02/18/23 0751  WBC 18.0*   < > 16.9* 17.7* 15.2* 14.7* 13.2*  NEUTROABS 15.2*  --   --   --   --   --   --   HGB 11.7*   < > 10.0* 10.8* 10.2* 10.7* 9.5*  HCT 35.4*   < > 30.1* 31.7* 31.3* 31.9* 27.6*  MCV 88.9   < > 87.8 85.4 88.4 87.2 88.2  PLT 270   < > 292 345 373 450* 389   < > = values in this interval not displayed.   Cardiac Enzymes: No results for input(s): "CKTOTAL", "CKMB", "CKMBINDEX", "TROPONINI" in the last 168 hours. BNP: Invalid input(s): "POCBNP" CBG: Recent Labs  Lab 02/17/23 1132 02/17/23 1600 02/17/23 2124 02/18/23 0725 02/18/23 1135  GLUCAP 142* 190* 147* 121* 135*   D-Dimer No results for input(s): "DDIMER" in the last 72 hours. Hgb A1c No results for input(s): "HGBA1C" in the last 72 hours. Lipid Profile No results for input(s): "CHOL", "HDL", "LDLCALC", "TRIG", "CHOLHDL", "LDLDIRECT" in the last 72 hours. Thyroid function studies No results for input(s): "TSH", "T4TOTAL", "T3FREE",  "THYROIDAB" in the last 72 hours.  Invalid input(s): "FREET3" Anemia work up No results for input(s): "VITAMINB12", "FOLATE", "FERRITIN", "TIBC", "IRON", "RETICCTPCT" in the last 72 hours. Urinalysis    Component Value Date/Time   COLORURINE YELLOW 07/11/2022 0200   APPEARANCEUR CLEAR 07/11/2022 0200   LABSPEC 1.010 07/11/2022 0200   PHURINE 7.0 07/11/2022 0200   GLUCOSEU >=500 (A) 07/11/2022 0200   HGBUR NEGATIVE 07/11/2022 0200   BILIRUBINUR  NEGATIVE 07/11/2022 0200   KETONESUR 40 (A) 07/11/2022 0200   PROTEINUR NEGATIVE 07/11/2022 0200   UROBILINOGEN 0.2 09/03/2012 1643   NITRITE NEGATIVE 07/11/2022 0200   LEUKOCYTESUR NEGATIVE 07/11/2022 0200   Sepsis Labs Recent Labs  Lab 02/15/23 0348 02/16/23 0258 02/17/23 0944 02/18/23 0751  WBC 17.7* 15.2* 14.7* 13.2*   Microbiology Recent Results (from the past 240 hour(s))  Blood Cultures x 2 sites     Status: None   Collection Time: 02/12/23  2:03 PM   Specimen: BLOOD  Result Value Ref Range Status   Specimen Description BLOOD LEFT ANTECUBITAL  Final   Special Requests   Final    BOTTLES DRAWN AEROBIC AND ANAEROBIC Blood Culture adequate volume   Culture   Final    NO GROWTH 5 DAYS Performed at [facility], [address], [place], [place] [address]    Report Status 02/17/2023 FINAL  Final  Blood Cultures x 2 sites     Status: None   Collection Time: 02/12/23  3:57 PM   Specimen: BLOOD LEFT FOREARM  Result Value Ref Range Status   Specimen Description BLOOD LEFT FOREARM  Final   Special Requests   Final    BOTTLES DRAWN AEROBIC AND ANAEROBIC Blood Culture results may not be optimal due to an excessive volume of blood received in culture bottles   Culture   Final    NO GROWTH 5 DAYS Performed at [facility], [address] [place], [place] [address]    Report Status 02/17/2023 FINAL  Final  MRSA Next Gen by PCR, Nasal     Status: None   Collection Time: 02/16/23  4:45 AM   Specimen: Nasal Mucosa;  Nasal Swab  Result Value Ref Range Status   MRSA by PCR Next Gen NOT DETECTED NOT DETECTED Final    Comment: (NOTE) The GeneXpert MRSA Assay (FDA approved for NASAL specimens only), is one component of a comprehensive MRSA colonization surveillance program. It is not intended to diagnose MRSA infection nor to guide or monitor treatment for MRSA infections. Test performance is not FDA approved in patients less than 62 years old. Performed at [facility], [address], [place], [place] [address]   Aerobic/Anaerobic Culture w Gram Stain (surgical/deep wound)     Status: None (Preliminary result)   Collection Time: 02/16/23 11:36 AM   Specimen: Path Tissue  Result Value Ref Range Status   Specimen Description TISSUE RIGHT TOE  Final   Special Requests NONE  Final   Gram Stain   Final    FEW WBC PRESENT, PREDOMINANTLY PMN RARE GRAM POSITIVE COCCI IN PAIRS    Culture   Final    CULTURE REINCUBATED FOR BETTER GROWTH Performed at [facility], [address], [place], [place] [address]    Report Status PENDING  Incomplete     Time coordinating discharge: Over 30 minutes  SIGNED:   Willeen Niece, MD  [facility] 02/18/2023, 12:38 PM Pager   If 7PM-7AM, please contact night-coverage

## 2023-02-21 ENCOUNTER — Encounter (HOSPITAL_COMMUNITY): Payer: Self-pay | Admitting: Orthopedic Surgery

## 2023-02-21 ENCOUNTER — Telehealth: Payer: Self-pay | Admitting: Orthopedic Surgery

## 2023-02-21 ENCOUNTER — Other Ambulatory Visit: Payer: Self-pay | Admitting: Orthopedic Surgery

## 2023-02-21 LAB — AEROBIC/ANAEROBIC CULTURE W GRAM STAIN (SURGICAL/DEEP WOUND)

## 2023-02-21 MED ORDER — AMOXICILLIN-POT CLAVULANATE 875-125 MG PO TABS: 1.0000 | ORAL_TABLET | Freq: Two times a day (BID) | ORAL | 0 refills | Status: DC

## 2023-02-21 NOTE — Telephone Encounter (Signed)
LMTCB

## 2023-02-21 NOTE — Anesthesia Postprocedure Evaluation (Signed)
Anesthesia Post Note  Patient: Douglas Murphy  Procedure(s) Performed: RIGHT FOOT 5TH RAY AMPUTATION (Right)     Patient location during evaluation: PACU Anesthesia Type: Regional and MAC Level of consciousness: awake and alert Pain management: pain level controlled Vital Signs Assessment: post-procedure vital signs reviewed and stable Respiratory status: spontaneous breathing, nonlabored ventilation, respiratory function stable and patient connected to nasal cannula oxygen Cardiovascular status: stable and blood pressure returned to baseline Postop Assessment: no apparent nausea or vomiting Anesthetic complications: no   No notable events documented.  Last Vitals:  Vitals:   02/18/23 0626 02/18/23 0845  BP: (!) 150/90 138/80  Pulse: 75 72  Resp:  18  Temp:  37 C  SpO2: 96% 98%    Last Pain:  Vitals:   02/18/23 0845  TempSrc: Oral  PainSc: 0-No pain                 Quante Pettry S

## 2023-02-21 NOTE — Telephone Encounter (Signed)
Message Received: Today Nadara Mustard, MD  Gretta Arab C Prescription sent for Augmentin to his pharmacy.  Culture shows a bacteria that is sensitive to most antibiotics.

## 2023-02-22 NOTE — Telephone Encounter (Signed)
LMTCB, stated on VM (x2 times) that abx is ready for pick up due to change in culture. Asked him twice to call us back to let us know he got my message. He has an appt tomorrow with Denny Peon. Will hold message

## 2023-02-23 ENCOUNTER — Encounter: Payer: Self-pay | Admitting: Family

## 2023-02-23 ENCOUNTER — Ambulatory Visit (INDEPENDENT_AMBULATORY_CARE_PROVIDER_SITE_OTHER): Payer: Medicaid Other | Admitting: Family

## 2023-02-23 DIAGNOSIS — L02415 Cutaneous abscess of right lower limb: Secondary | ICD-10-CM

## 2023-02-23 DIAGNOSIS — Z89421 Acquired absence of other right toe(s): Secondary | ICD-10-CM

## 2023-02-23 DIAGNOSIS — L03115 Cellulitis of right lower limb: Secondary | ICD-10-CM
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
## 2023-02-23 NOTE — Telephone Encounter (Signed)
Pt confirmed he received the message during his visit in the office today and will pick up the medication today.

## 2023-02-24 ENCOUNTER — Telehealth: Payer: Self-pay | Admitting: Family

## 2023-02-24 NOTE — Telephone Encounter (Signed)
Called patient got recording call can not be completed at this time.Please try call again later.   I sent mychart message asking patient to call and reschedule his appointment with Dr. Lajoyce Corners

## 2023-03-02 ENCOUNTER — Encounter: Payer: Medicaid Other | Admitting: Family

## 2023-03-08 ENCOUNTER — Encounter: Payer: Self-pay | Admitting: Orthopedic Surgery

## 2023-03-08 ENCOUNTER — Ambulatory Visit (INDEPENDENT_AMBULATORY_CARE_PROVIDER_SITE_OTHER): Payer: Medicaid Other | Admitting: Orthopedic Surgery

## 2023-03-08 DIAGNOSIS — L03115 Cellulitis of right lower limb: Secondary | ICD-10-CM

## 2023-03-08 DIAGNOSIS — L02415 Cutaneous abscess of right lower limb: Secondary | ICD-10-CM
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
## 2023-03-24 ENCOUNTER — Ambulatory Visit (INDEPENDENT_AMBULATORY_CARE_PROVIDER_SITE_OTHER): Payer: Medicaid Other | Admitting: Orthopedic Surgery

## 2023-03-24 DIAGNOSIS — L03115 Cellulitis of right lower limb: Secondary | ICD-10-CM | POA: Diagnosis not present

## 2023-03-24 DIAGNOSIS — Z89421 Acquired absence of other right toe(s): Secondary | ICD-10-CM

## 2023-03-24 DIAGNOSIS — L02415 Cutaneous abscess of right lower limb: Secondary | ICD-10-CM | POA: Diagnosis not present

## 2023-03-28 DIAGNOSIS — E11621 Type 2 diabetes mellitus with foot ulcer: Secondary | ICD-10-CM | POA: Diagnosis not present

## 2023-03-28 DIAGNOSIS — E1169 Type 2 diabetes mellitus with other specified complication: Secondary | ICD-10-CM | POA: Diagnosis not present

## 2023-03-28 DIAGNOSIS — R82998 Other abnormal findings in urine: Secondary | ICD-10-CM | POA: Diagnosis not present

## 2023-03-28 DIAGNOSIS — I10 Essential (primary) hypertension: Secondary | ICD-10-CM | POA: Diagnosis not present

## 2023-03-28 DIAGNOSIS — Z1212 Encounter for screening for malignant neoplasm of rectum: Secondary | ICD-10-CM | POA: Diagnosis not present

## 2023-03-28 DIAGNOSIS — E1142 Type 2 diabetes mellitus with diabetic polyneuropathy: Secondary | ICD-10-CM | POA: Diagnosis not present

## 2023-03-30 ENCOUNTER — Encounter: Payer: Self-pay | Admitting: Family

## 2023-03-30 ENCOUNTER — Ambulatory Visit: Payer: Medicaid Other | Admitting: Family

## 2023-03-30 DIAGNOSIS — E11628 Type 2 diabetes mellitus with other skin complications: Secondary | ICD-10-CM

## 2023-03-30 DIAGNOSIS — L089 Local infection of the skin and subcutaneous tissue, unspecified: Secondary | ICD-10-CM

## 2023-03-30 DIAGNOSIS — Z89431 Acquired absence of right foot: Secondary | ICD-10-CM

## 2023-03-30 MED ORDER — DOXYCYCLINE HYCLATE 100 MG PO TABS: 100.0000 mg | ORAL_TABLET | Freq: Two times a day (BID) | ORAL | 0 refills | Status: DC
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
## 2023-03-31 ENCOUNTER — Encounter: Payer: Medicaid Other | Admitting: Orthopedic Surgery

## 2023-04-01 ENCOUNTER — Telehealth: Payer: Self-pay | Admitting: Orthopedic Surgery

## 2023-04-01 ENCOUNTER — Emergency Department (HOSPITAL_COMMUNITY): Payer: 59

## 2023-04-01 ENCOUNTER — Encounter (HOSPITAL_COMMUNITY): Payer: Self-pay

## 2023-04-01 ENCOUNTER — Inpatient Hospital Stay (HOSPITAL_COMMUNITY)
Admission: EM | Admit: 2023-04-01 | Discharge: 2023-04-04 | DRG: 638 | Disposition: A | Discharge status: Home / Self Care | Payer: 59 | Attending: Internal Medicine | Admitting: Internal Medicine

## 2023-04-01 ENCOUNTER — Encounter: Payer: Self-pay | Admitting: Orthopedic Surgery

## 2023-04-01 DIAGNOSIS — Z89421 Acquired absence of other right toe(s): Secondary | ICD-10-CM | POA: Diagnosis not present

## 2023-04-01 DIAGNOSIS — R739 Hyperglycemia, unspecified: Secondary | ICD-10-CM

## 2023-04-01 DIAGNOSIS — Z794 Long term (current) use of insulin: Secondary | ICD-10-CM | POA: Diagnosis not present

## 2023-04-01 DIAGNOSIS — Z91018 Allergy to other foods: Secondary | ICD-10-CM | POA: Diagnosis not present

## 2023-04-01 DIAGNOSIS — E1165 Type 2 diabetes mellitus with hyperglycemia: Secondary | ICD-10-CM | POA: Diagnosis not present

## 2023-04-01 DIAGNOSIS — E1142 Type 2 diabetes mellitus with diabetic polyneuropathy: Secondary | ICD-10-CM | POA: Diagnosis present

## 2023-04-01 DIAGNOSIS — E11628 Type 2 diabetes mellitus with other skin complications: Principal | ICD-10-CM | POA: Diagnosis present

## 2023-04-01 DIAGNOSIS — K760 Fatty (change of) liver, not elsewhere classified: Secondary | ICD-10-CM | POA: Diagnosis not present

## 2023-04-01 DIAGNOSIS — Z8249 Family history of ischemic heart disease and other diseases of the circulatory system: Secondary | ICD-10-CM

## 2023-04-01 DIAGNOSIS — M009 Pyogenic arthritis, unspecified: Secondary | ICD-10-CM | POA: Diagnosis not present

## 2023-04-01 DIAGNOSIS — R600 Localized edema: Secondary | ICD-10-CM | POA: Diagnosis not present

## 2023-04-01 DIAGNOSIS — M79671 Pain in right foot: Secondary | ICD-10-CM | POA: Diagnosis not present

## 2023-04-01 DIAGNOSIS — Z79899 Other long term (current) drug therapy: Secondary | ICD-10-CM | POA: Diagnosis not present

## 2023-04-01 DIAGNOSIS — E11621 Type 2 diabetes mellitus with foot ulcer: Secondary | ICD-10-CM

## 2023-04-01 DIAGNOSIS — E1151 Type 2 diabetes mellitus with diabetic peripheral angiopathy without gangrene: Secondary | ICD-10-CM | POA: Diagnosis present

## 2023-04-01 DIAGNOSIS — Z7984 Long term (current) use of oral hypoglycemic drugs: Secondary | ICD-10-CM

## 2023-04-01 DIAGNOSIS — E119 Type 2 diabetes mellitus without complications: Secondary | ICD-10-CM

## 2023-04-01 DIAGNOSIS — R609 Edema, unspecified: Secondary | ICD-10-CM | POA: Diagnosis present

## 2023-04-01 DIAGNOSIS — M25474 Effusion, right foot: Secondary | ICD-10-CM | POA: Diagnosis not present

## 2023-04-01 DIAGNOSIS — Z89422 Acquired absence of other left toe(s): Secondary | ICD-10-CM | POA: Diagnosis not present

## 2023-04-01 DIAGNOSIS — L03115 Cellulitis of right lower limb: Secondary | ICD-10-CM | POA: Diagnosis not present

## 2023-04-01 DIAGNOSIS — L089 Local infection of the skin and subcutaneous tissue, unspecified: Secondary | ICD-10-CM | POA: Diagnosis present

## 2023-04-01 DIAGNOSIS — L97509 Non-pressure chronic ulcer of other part of unspecified foot with unspecified severity: Secondary | ICD-10-CM | POA: Diagnosis not present

## 2023-04-01 DIAGNOSIS — I10 Essential (primary) hypertension: Secondary | ICD-10-CM | POA: Diagnosis present

## 2023-04-01 LAB — CBC WITH DIFFERENTIAL/PLATELET
Abs Immature Granulocytes: 0.07 10*3/uL (ref 0.00–0.07)
Basophils Absolute: 0 10*3/uL (ref 0.0–0.1)
Basophils Relative: 0 %
Eosinophils Absolute: 0.4 10*3/uL (ref 0.0–0.5)
Eosinophils Relative: 4 %
HCT: 31.5 % — ABNORMAL LOW (ref 39.0–52.0)
Hemoglobin: 10.1 g/dL — ABNORMAL LOW (ref 13.0–17.0)
Immature Granulocytes: 1 %
Lymphocytes Relative: 16 %
Lymphs Abs: 1.7 10*3/uL (ref 0.7–4.0)
MCH: 28.3 pg (ref 26.0–34.0)
MCHC: 32.1 g/dL (ref 30.0–36.0)
MCV: 88.2 fL (ref 80.0–100.0)
Monocytes Absolute: 0.7 10*3/uL (ref 0.1–1.0)
Monocytes Relative: 7 %
Neutro Abs: 7.7 10*3/uL (ref 1.7–7.7)
Neutrophils Relative %: 72 %
Platelets: 365 10*3/uL (ref 150–400)
RBC: 3.57 MIL/uL — ABNORMAL LOW (ref 4.22–5.81)
RDW: 13.5 % (ref 11.5–15.5)
WBC: 10.6 10*3/uL — ABNORMAL HIGH (ref 4.0–10.5)
nRBC: 0 % (ref 0.0–0.2)

## 2023-04-01 LAB — COMPREHENSIVE METABOLIC PANEL
ALT: 19 U/L (ref 0–44)
AST: 16 U/L (ref 15–41)
Albumin: 3 g/dL — ABNORMAL LOW (ref 3.5–5.0)
Alkaline Phosphatase: 95 U/L (ref 38–126)
Anion gap: 8 (ref 5–15)
BUN: 16 mg/dL (ref 6–20)
CO2: 26 mmol/L (ref 22–32)
Calcium: 9 mg/dL (ref 8.9–10.3)
Chloride: 97 mmol/L — ABNORMAL LOW (ref 98–111)
Creatinine, Ser: 1.15 mg/dL (ref 0.61–1.24)
GFR, Estimated: >60 mL/min (ref >60)
Glucose, Bld: 495 mg/dL — ABNORMAL HIGH (ref 70–99)
Potassium: 4.6 mmol/L (ref 3.5–5.1)
Sodium: 131 mmol/L — ABNORMAL LOW (ref 135–145)
Total Bilirubin: 0.4 mg/dL (ref 0.3–1.2)
Total Protein: 7.9 g/dL (ref 6.5–8.1)

## 2023-04-01 LAB — LACTIC ACID, PLASMA
Lactic Acid, Venous: 2 mmol/L (ref 0.5–1.9)
Lactic Acid, Venous: 1.3 mmol/L (ref 0.5–1.9)

## 2023-04-01 LAB — CBG MONITORING, ED: Glucose-Capillary: 260 mg/dL — ABNORMAL HIGH (ref 70–99)

## 2023-04-01 LAB — GLUCOSE, CAPILLARY: Glucose-Capillary: 252 mg/dL — ABNORMAL HIGH (ref 70–99)

## 2023-04-01 LAB — HIV ANTIBODY (ROUTINE TESTING W REFLEX): HIV Screen 4th Generation wRfx: NONREACTIVE

## 2023-04-01 MED ORDER — VANCOMYCIN HCL 1750 MG/350ML IV SOLN
1750.0000 mg | Freq: Once | INTRAVENOUS | Status: AC
  Administered 2023-04-01: 1750 mg via INTRAVENOUS
  Filled 2023-04-01: qty 350

## 2023-04-01 MED ORDER — SODIUM CHLORIDE 0.9 % IV SOLN
2.0000 g | INTRAVENOUS | Status: DC
  Administered 2023-04-01 – 2023-04-03 (×3): 2 g via INTRAVENOUS
  Filled 2023-04-01 (×3): qty 20

## 2023-04-01 MED ORDER — INSULIN NPH (HUMAN) (ISOPHANE) 100 UNIT/ML ~~LOC~~ SUSP
10.0000 [IU] | Freq: Two times a day (BID) | SUBCUTANEOUS | Status: DC
  Administered 2023-04-02 (×2): 10 [IU] via SUBCUTANEOUS
  Filled 2023-04-01: qty 10

## 2023-04-01 MED ORDER — ACETAMINOPHEN 325 MG PO TABS
650.0000 mg | ORAL_TABLET | Freq: Four times a day (QID) | ORAL | Status: DC | PRN
  Administered 2023-04-02 – 2023-04-03 (×2): 650 mg via ORAL
  Filled 2023-04-01 (×2): qty 2

## 2023-04-01 MED ORDER — METRONIDAZOLE 500 MG/100ML IV SOLN
500.0000 mg | Freq: Two times a day (BID) | INTRAVENOUS | Status: DC
  Administered 2023-04-01 – 2023-04-04 (×6): 500 mg via INTRAVENOUS
  Filled 2023-04-01 (×6): qty 100

## 2023-04-01 MED ORDER — SODIUM CHLORIDE 0.9 % IV BOLUS
1000.0000 mL | Freq: Once | INTRAVENOUS | Status: AC
  Administered 2023-04-01: 1000 mL via INTRAVENOUS

## 2023-04-01 MED ORDER — ONDANSETRON HCL 4 MG/2ML IJ SOLN: 4.0000 mg | Freq: Four times a day (QID) | INTRAMUSCULAR | Status: DC | PRN

## 2023-04-01 MED ORDER — INSULIN ASPART 100 UNIT/ML IJ SOLN
0.0000 [IU] | INTRAMUSCULAR | Status: DC
  Administered 2023-04-01: 5 [IU] via SUBCUTANEOUS
  Administered 2023-04-02: 2 [IU] via SUBCUTANEOUS
  Administered 2023-04-02: 5 [IU] via SUBCUTANEOUS
  Administered 2023-04-02: 3 [IU] via SUBCUTANEOUS
  Administered 2023-04-02 (×2): 2 [IU] via SUBCUTANEOUS
  Administered 2023-04-03: 1 [IU] via SUBCUTANEOUS
  Administered 2023-04-03: 2 [IU] via SUBCUTANEOUS
  Administered 2023-04-03: 3 [IU] via SUBCUTANEOUS
  Administered 2023-04-03: 2 [IU] via SUBCUTANEOUS
  Administered 2023-04-03: 1 [IU] via SUBCUTANEOUS
  Administered 2023-04-04: 5 [IU] via SUBCUTANEOUS
  Administered 2023-04-04: 1 [IU] via SUBCUTANEOUS

## 2023-04-01 MED ORDER — ONDANSETRON HCL 4 MG PO TABS: 4.0000 mg | ORAL_TABLET | Freq: Four times a day (QID) | ORAL | Status: DC | PRN

## 2023-04-01 MED ORDER — ACETAMINOPHEN 650 MG RE SUPP: 650.0000 mg | Freq: Four times a day (QID) | RECTAL | Status: DC | PRN

## 2023-04-01 NOTE — Progress Notes (Signed)
Pharmacy Antibiotic Note  Douglas Murphy is a 57 y.o. male admitted on 04/01/2023 with  Diabetic foot wound infection .  Pharmacy has been consulted for Vancomycin dosing.  Patient received Ceftriaxone 2g IV x1, Metronidazole 500mg  IV x1, and Vancomycin 1750mg  IV x1 in the ED   WBC 10.6, Tmax 98, LA 2.0, HR 94, RR 16 SCr 1.15  Plan: Vancomycin 1000mg  IV q12h (eAUC ~472)    > Goal AUC 400-550    > Check vancomycin levels at steady state  Continue Ceftriaxone 2g IV q24h per MD Continue Metronidazole 500mg  IV q12h per MD Monitor daily CBC, temp, SCr, and for clinical signs of improvement  F/u cultures and de-escalate antibiotics as able   Height: 6' (182.9 cm) Weight: 82.6 kg (182 lb) IBW/kg (Calculated) : 77.6  Temp (24hrs), Avg:98 F (36.7 C), Min:97.9 F (36.6 C), Max:98 F (36.7 C)  Recent Labs  Lab 04/01/23 1540  WBC 10.6*  CREATININE 1.15  LATICACIDVEN 2.0*    Estimated Creatinine Clearance: 80.6 mL/min (by C-G formula based on SCr of 1.15 mg/dL).    Allergies  Allergen Reactions   Other Anaphylaxis and Other (See Comments)    [place] nuts     Antimicrobials this admission: Ceftriaxone 7/19 >>  Metronidazole 7/19 >>  Vancomycin 7/19 >>   Dose adjustments this admission: N/A  Microbiology results: 7/19 BCx: sent   Thank you for allowing pharmacy to be a part of this patient's care.  Wilburn Cornelia, PharmD, BCPS Clinical Pharmacist 04/01/2023 8:53 PM   Please refer to [facility] for pharmacy phone number

## 2023-04-01 NOTE — Telephone Encounter (Signed)
I called pt and advised per Dr. Lajoyce Corners he should proceed to [facility] ER for eval. At last visit foot looked healthy and no s/s of infection and if he is having this change he should go for eval. Pt verbalized understanding and will proceed to ER.

## 2023-04-01 NOTE — Assessment & Plan Note (Addendum)
Takes 30u of 70/30 BID + metformin at home NPH 10u BID SSI Q4H sensitive scale while NPO Hold metformin

## 2023-04-01 NOTE — Progress Notes (Signed)
New Admission Note:  Arrival Method: Wheelchair from ED Mental Orientation: A&Ox4 Telemetry: N/A Assessment: Completed Skin: Stg 2 pressure wnd to ball of L foot 0.5x0.5; R foot wound wrapped with ace bandage, non-puritic rash on chest  IV: PIV RAC and LFA Pain: 2/10 with movement Tubes: None Safety Measures: Safety Fall Prevention Plan was discussed  Admission: Initiated Belongings: Clothing in bag at bedside Unit Orientation: Patient has been oriented to the room, unit, and the staff. Orders have been reviewed and discussed with patient. Call light has been placed within reach and bed alarm has been activated.   Nancy Marus, RN    04/01/23 2232  Assess: MEWS Score  Temp 98.5 F (36.9 C)  BP (!) 138/91  MAP (mmHg) 103  Pulse Rate (!) 120  Resp 18  SpO2 100 %  O2 Device Room Air  Assess: MEWS Score  MEWS Temp 0  MEWS Systolic 0  MEWS Pulse 2  MEWS RR 0  MEWS LOC 0  MEWS Score 2  MEWS Score Color Yellow  Assess: if the MEWS score is Yellow or Red  Were vital signs taken at a resting state? Yes  Focused Assessment No change from prior assessment  Does the patient meet 2 or more of the SIRS criteria? No  MEWS guidelines implemented  No, previously yellow, continue vital signs every 4 hours  Notify: Charge Nurse/RN  Name of Charge Nurse/RN Notified Leta Baptist. RN.  Assess: SIRS CRITERIA  SIRS Temperature  0  SIRS Pulse 1  SIRS Respirations  0  SIRS WBC 0  SIRS Score Sum  1

## 2023-04-01 NOTE — Progress Notes (Signed)
ED Pharmacy Antibiotic Sign Off An antibiotic consult was received from an ED provider for Vancomycin per pharmacy dosing for Diabetic foot wound infection. A chart review was completed to assess appropriateness.   The following one time order(s) were placed:  Vancomycin 1750mg  IV x1  Further antibiotic and/or antibiotic pharmacy consults should be ordered by the admitting provider if indicated.   Thank you for allowing pharmacy to be a part of this patient's care.   Wilburn Cornelia, PharmD, BCPS Clinical Pharmacist 04/01/2023 7:40 PM   Please refer to [facility] for pharmacy phone number

## 2023-04-01 NOTE — ED Provider Triage Note (Signed)
Emergency Medicine Provider Triage Evaluation Note  Douglas Murphy , a 57 y.o. male  was evaluated in triage.  Pt complains of right foot pain and swelling.  Review of Systems  Positive:  Negative:   Physical Exam  BP 111/80 (BP Location: Right Arm)   Pulse 94   Temp 97.9 F (36.6 C) (Oral)   Resp 16   Ht 6' (1.829 m)   Wt 82.6 kg   SpO2 99%   BMI 24.68 kg/m  Gen:   Awake, no distress   Resp:  Normal effort  MSK:   Moves extremities without difficulty  Other:    Medical Decision Making  Medically screening exam initiated at 3:29 PM.  Appropriate orders placed.  Polo Riley was informed that the remainder of the evaluation will be completed by another provider, this initial triage assessment does not replace that evaluation, and the importance of remaining in the ED until their evaluation is complete.  Complaining of right foot pain and swelling x2 days. Surgery for diabetic toe amputation with Dr. Lajoyce Corners beginning of 02/2023. Foot has been healing well until mild pain started on Wednesday. Patient visited ortho office and discharged with Doxycycline on Wednesday. Foot is now red and swollen. Symptoms have not improved with Doxy.   Denies fever, chest pain, dyspnea, abdominal pain, nausea, vomiting. Denies Hx DVT/PE, hemoptysis.  Physical exam concerning for foot infection. Right foot is swollen and red. Brisk capillary refill. No calf tenderness to palpation.   Dorthy Cooler, [place] 04/01/23 1539

## 2023-04-01 NOTE — ED Provider Notes (Addendum)
[place] EMERGENCY DEPARTMENT AT [facility] Provider Note   CSN: [id number] Arrival date & time: 04/01/23  1517     History  Chief Complaint  Patient presents with  . Foot Pain    right    Douglas Murphy is a 57 y.o. male presented to ED with complaint of right foot wound.  Patient reports that he has diabetes and neuropathy of his feet.  He underwent a right fifth toe amputation with Dr.Duda Cyndia Skeeters in June 2024.  He was seen in the office for reevaluation 2 days ago and noted to have appropriate wound healing at the time, but there was concerned about developing redness around the wound site.  He was started on doxycycline and has been taking this for 2 days.  He reports the pain and redness continued to spread up his foot.  He denies fevers or chills.  HPI     Home Medications Prior to Admission medications   Medication Sig Start Date End Date Taking? Authorizing Provider  doxycycline (VIBRA-TABS) 100 MG tablet Take 1 tablet (100 mg total) by mouth 2 (two) times daily. 03/30/23   Adonis Huguenin, NP  insulin NPH-regular Human (NOVOLIN 70/30) (70-30) 100 UNIT/ML injection Inject 30 Units into the skin 2 (two) times daily.    [provider]  lisinopril (PRINIVIL,ZESTRIL) 5 MG tablet Take 5 mg by mouth every evening.    [provider]  metFORMIN (GLUCOPHAGE) 1000 MG tablet Take 1,000 mg by mouth 2 (two) times daily.    [provider]  simvastatin (ZOCOR) 10 MG tablet Take 10 mg by mouth every evening.    [provider]      Allergies    Other    Review of Systems   Review of Systems  Physical Exam Updated Vital Signs BP (!) 150/97   Pulse (!) 105   Temp 98 F (36.7 C) (Oral)   Resp 18   Ht 6' (1.829 m)   Wt 82.6 kg   SpO2 100%   BMI 24.68 kg/m  Physical Exam Constitutional:      General: He is not in acute distress. HENT:     Head: Normocephalic and atraumatic.  Eyes:     Conjunctiva/sclera:  Conjunctivae normal.     Pupils: Pupils are equal, round, and reactive to light.  Cardiovascular:     Rate and Rhythm: Normal rate and regular rhythm.  Pulmonary:     Effort: Pulmonary effort is normal. No respiratory distress.  Abdominal:     General: There is no distension.     Tenderness: There is no abdominal tenderness.  Skin:    General: Skin is warm and dry.     Comments: Amputation incision site appears clean, with pain granulated tissue, no purulent drainage.  There is a small region of eschar near the top of the amputation site.  Remainder of the right foot does appear warm, red, with mild edema and tenderness, no other open ulcerations noted of the toes or discoloration of the toes  Neurological:     General: No focal deficit present.     Mental Status: He is alert. Mental status is at baseline.  Psychiatric:        Mood and Affect: Mood normal.        Behavior: Behavior normal.     ED Results / Procedures / Treatments   Labs (all labs ordered are listed, but only abnormal results are displayed) Labs Reviewed  COMPREHENSIVE METABOLIC PANEL -  Abnormal; Notable for the following components:      Result Value   Sodium 131 (*)    Chloride 97 (*)    Glucose, Bld 495 (*)    Albumin 3.0 (*)    All other components within normal limits  CBC WITH DIFFERENTIAL/PLATELET - Abnormal; Notable for the following components:   WBC 10.6 (*)    RBC 3.57 (*)    Hemoglobin 10.1 (*)    HCT 31.5 (*)    All other components within normal limits  LACTIC ACID, PLASMA - Abnormal; Notable for the following components:   Lactic Acid, Venous 2.0 (*)    All other components within normal limits  CULTURE, BLOOD (ROUTINE X 2)  CULTURE, BLOOD (ROUTINE X 2)  LACTIC ACID, PLASMA    EKG None  Radiology DG Foot Complete Right  Result Date: 04/01/2023 CLINICAL DATA:  Right foot pain and swelling for 1 day. On antibiotics for possible foot infection. Recent right foot surgery. EXAM: RIGHT FOOT  COMPLETE - 3+ VIEW COMPARISON:  Radiographs 02/12/2023.  MRI 02/12/2023. FINDINGS: Patient has undergone interval amputation of the 5th ray at the tarsometatarsal joint. There is ill-defined lucency within the lateral base of the 4th metatarsal and the adjacent lateral aspect of the cuboid, suspicious for osteomyelitis. No other suspicious or acute osseous findings. There are stable mild midfoot degenerative changes. There is lateral forefoot soft tissue swelling without foreign body or soft tissue emphysema. Scattered vascular calcifications are noted. IMPRESSION: 1. Findings suspicious for osteomyelitis involving the base of the 4th metatarsal and the adjacent cuboid. 2. Interval amputation of the 5th ray at the tarsometatarsal joint. Electronically Signed   By: Carey Bullocks M.D.   On: 04/01/2023 16:47    Procedures Procedures    Medications Ordered in ED Medications  cefTRIAXone (ROCEPHIN) 2 g in sodium chloride 0.9 % 100 mL IVPB (0 g Intravenous Stopped 04/01/23 1938)  metroNIDAZOLE (FLAGYL) IVPB 500 mg (0 mg Intravenous Stopped 04/01/23 2008)  vancomycin (VANCOREADY) IVPB 1750 mg/350 mL (1,750 mg Intravenous New Bag/Given 04/01/23 2019)  sodium chloride 0.9 % bolus 1,000 mL (0 mLs Intravenous Stopped 04/01/23 2018)    ED Course/ Medical Decision Making/ A&P Clinical Course as of 04/01/23 2022  Fri Apr 01, 2023  2022 Admitted to Dr Julian Reil hospitalist [MT]    Clinical Course User Index [MT] Terald Sleeper, MD                             Medical Decision Making Amount and/or Complexity of Data Reviewed Radiology: ordered.  Risk Prescription drug management. Decision regarding hospitalization.   Patient is here with concern for infection involving the right foot.  He does appear to be developing cellulitis on exam and is failed outpatient treatment with doxycycline for 2 days.   Lactate is 2.0.  White blood cell count 10.6.  Blood cultures drawn.  IV antibiotics ordered for  lower extremity wound including vancomycin given the patient has failed with doxycycline.  IV fluid bolus.  X-ray of the foot was personally viewed interpreted concern for potential osteomyelitic changes at the base of the fourth metatarsal and cuboid bone.  Consulted with Dr Christell Constant from [facility] - plan for NPO at midnight, MRI foot, ortho consult in hospital, unclear if requiring further surgery        Final Clinical Impression(s) / ED Diagnoses Final diagnoses:  Cellulitis of right lower extremity    Rx / DC Orders  ED Discharge Orders     None         Terald Sleeper, MD 04/01/23 1925    Terald Sleeper, MD 04/01/23 2022

## 2023-04-01 NOTE — ED Triage Notes (Addendum)
Pt to ED c/o right foot pain and swelling x 1 day. Currently on ABX for possible foot infection. Recent right foot surgery, ortho boot in place

## 2023-04-01 NOTE — Telephone Encounter (Signed)
Patient called. Says his foot is red and warm around his ankle. Shouldnhecome in or goto ed? Cb# [phone]
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
## 2023-04-01 NOTE — Assessment & Plan Note (Addendum)
Infection about last months amputation site of R 5th toe.  Concern for osteomyelitis of 4th metatarsal on plain film X ray today. Failed outpt doxy Putting on rocephin, vanc, flagyl EDP d/w Ortho: NPO after MN MRI foot Ortho to consult in hospital

## 2023-04-02 ENCOUNTER — Inpatient Hospital Stay (HOSPITAL_COMMUNITY): Payer: 59

## 2023-04-02 ENCOUNTER — Other Ambulatory Visit: Payer: Self-pay

## 2023-04-02 DIAGNOSIS — E11621 Type 2 diabetes mellitus with foot ulcer: Secondary | ICD-10-CM | POA: Diagnosis not present

## 2023-04-02 DIAGNOSIS — L089 Local infection of the skin and subcutaneous tissue, unspecified: Secondary | ICD-10-CM | POA: Diagnosis not present

## 2023-04-02 DIAGNOSIS — E11628 Type 2 diabetes mellitus with other skin complications: Secondary | ICD-10-CM | POA: Diagnosis not present

## 2023-04-02 DIAGNOSIS — L97509 Non-pressure chronic ulcer of other part of unspecified foot with unspecified severity: Secondary | ICD-10-CM | POA: Diagnosis not present

## 2023-04-02 LAB — GLUCOSE, CAPILLARY
Glucose-Capillary: 115 mg/dL — ABNORMAL HIGH (ref 70–99)
Glucose-Capillary: 164 mg/dL — ABNORMAL HIGH (ref 70–99)
Glucose-Capillary: 169 mg/dL — ABNORMAL HIGH (ref 70–99)
Glucose-Capillary: 194 mg/dL — ABNORMAL HIGH (ref 70–99)
Glucose-Capillary: 199 mg/dL — ABNORMAL HIGH (ref 70–99)
Glucose-Capillary: 210 mg/dL — ABNORMAL HIGH (ref 70–99)
Glucose-Capillary: 261 mg/dL — ABNORMAL HIGH (ref 70–99)

## 2023-04-02 LAB — BASIC METABOLIC PANEL
Anion gap: 8 (ref 5–15)
BUN: 13 mg/dL (ref 6–20)
CO2: 23 mmol/L (ref 22–32)
Calcium: 8.7 mg/dL — ABNORMAL LOW (ref 8.9–10.3)
Chloride: 102 mmol/L (ref 98–111)
Creatinine, Ser: 0.91 mg/dL (ref 0.61–1.24)
GFR, Estimated: >60 mL/min (ref >60)
Glucose, Bld: 309 mg/dL — ABNORMAL HIGH (ref 70–99)
Potassium: 4.4 mmol/L (ref 3.5–5.1)
Sodium: 133 mmol/L — ABNORMAL LOW (ref 135–145)

## 2023-04-02 LAB — CBC
HCT: 27.4 % — ABNORMAL LOW (ref 39.0–52.0)
Hemoglobin: 9.1 g/dL — ABNORMAL LOW (ref 13.0–17.0)
MCH: 29.2 pg (ref 26.0–34.0)
MCHC: 33.2 g/dL (ref 30.0–36.0)
MCV: 87.8 fL (ref 80.0–100.0)
Platelets: 329 10*3/uL (ref 150–400)
RBC: 3.12 MIL/uL — ABNORMAL LOW (ref 4.22–5.81)
RDW: 13.5 % (ref 11.5–15.5)
WBC: 11.3 10*3/uL — ABNORMAL HIGH (ref 4.0–10.5)
nRBC: 0 % (ref 0.0–0.2)

## 2023-04-02 LAB — CULTURE, BLOOD (ROUTINE X 2)

## 2023-04-02 MED ORDER — SIMVASTATIN 20 MG PO TABS
10.0000 mg | ORAL_TABLET | Freq: Every evening | ORAL | Status: DC
  Administered 2023-04-02 – 2023-04-03 (×2): 10 mg via ORAL
  Filled 2023-04-02 (×2): qty 1

## 2023-04-02 MED ORDER — ENOXAPARIN SODIUM 40 MG/0.4ML IJ SOSY
40.0000 mg | PREFILLED_SYRINGE | INTRAMUSCULAR | Status: DC
  Administered 2023-04-02 – 2023-04-03 (×2): 40 mg via SUBCUTANEOUS
  Filled 2023-04-02 (×2): qty 0.4

## 2023-04-02 MED ORDER — INSULIN NPH (HUMAN) (ISOPHANE) 100 UNIT/ML ~~LOC~~ SUSP
20.0000 [IU] | Freq: Two times a day (BID) | SUBCUTANEOUS | Status: DC
  Administered 2023-04-02 – 2023-04-04 (×4): 20 [IU] via SUBCUTANEOUS
  Filled 2023-04-02: qty 10

## 2023-04-02 MED ORDER — VANCOMYCIN HCL IN DEXTROSE 1-5 GM/200ML-% IV SOLN
1000.0000 mg | Freq: Two times a day (BID) | INTRAVENOUS | Status: DC
  Administered 2023-04-02 – 2023-04-03 (×2): 1000 mg via INTRAVENOUS
  Filled 2023-04-02 (×2): qty 200

## 2023-04-02 NOTE — Plan of Care (Signed)
  Problem: Education: Goal: Ability to describe self-care measures that may prevent or decrease complications (Diabetes Survival Skills Education) will improve Outcome: Progressing Goal: Individualized Educational Video(s) Outcome: Progressing   

## 2023-04-02 NOTE — Progress Notes (Signed)
Patient ID: Douglas Murphy, male   DOB: 12/13/1965, 57 y.o.   MRN: [id number] Patient is status post fifth ray amputation on the right foot.  The lateral wound has improved healthy granulation tissue.  There is no abscess or purulent drainage.  Patient does have increased cellulitis in the foot.  Review of the MRI scan does show destructive changes base of the lateral foot however this is where bone was resected with the rondure so the edema and bone loss appears most consistent with the bony resection from surgery.  Would continue the IV antibiotics and I will reevaluate on Monday.  Anticipate patient can be discharged on oral antibiotics.  Again discussed the importance of nonweightbearing on the foot to promote healing.

## 2023-04-02 NOTE — Plan of Care (Signed)
  Problem: Education: Goal: Ability to describe self-care measures that may prevent or decrease complications (Diabetes Survival Skills Education) will improve Outcome: Completed/Met Goal: Individualized Educational Video(s) Outcome: Not Applicable   

## 2023-04-02 NOTE — Progress Notes (Signed)
PROGRESS NOTE    Douglas Murphy  QMV:[id number] DOB: 17-Apr-1966 DOA: 04/01/2023 PCP: [name], MD    Chief Complaint  Patient presents with   Foot Pain    right    Brief Narrative:  Patient 57 year old gentleman history of type 2 diabetes with peripheral neuropathy, history of foot infections, underwent right fifth toe amputation by Dr. Lajoyce Corners June 2024.  Patient seen in orthopedics office 2 days prior to admission and noted to have developed redness around wound site.  Patient started on doxycycline as an outpatient however due to worsening pain and erythema patient presented to the ED.  Plain films done concerning for osteomyelitis.  MRI ordered.  Patient placed empirically on IV antibiotics.  Orthopedics consulted.   Assessment & Plan:   Principal Problem:   Diabetic infection of right foot (HCC) Active Problems:   Diabetes mellitus (HCC)   #1 diabetic foot infection right foot -Patient with erythema, redness, pain around amputation site of right fifth toe. -Plain films of the right foot obtained with concern for osteomyelitis of the fourth metatarsal. -Patient noted to have failed outpatient therapy. -MRI right foot obtained with findings concerning for osteomyelitis with septic arthritis around the anteriormost cuboid and fourth metatarsal base, no abscesses noted. -Continue IV vancomycin, IV Rocephin, IV Flagyl. -Orthopedics consultation pending.  2.  Diabetes mellitus type 2 -Hemoglobin A1c 11.3 (02/12/2023) -CBG 194 this morning. -Increase NPH to 20 units twice daily. -SSI. -Hold oral hypoglycemic agents. -Consult with diabetic coordinator.    DVT prophylaxis: SCDs, chemical DVT prophylaxis per orthopedics. Code Status: Full Family Communication: Updated patient.  No family at bedside. Disposition: Likely home when clinically improved and cleared by orthopedics  Status is: Inpatient Remains inpatient appropriate because: Severity of illness    Consultants:  Orthopedics pending  Procedures:  Plain films of the right foot 04/01/2023 MRI right foot 04/02/2023  Antimicrobials:  Anti-infectives (From admission, onward)    Start     Dose/Rate Route Frequency Ordered Stop   04/02/23 1500  vancomycin (VANCOCIN) IVPB 1000 mg/200 mL premix        1,000 mg 200 mL/hr over 60 Minutes Intravenous Every 12 hours 04/02/23 1441     04/01/23 1945  vancomycin (VANCOREADY) IVPB 1750 mg/350 mL        1,750 mg 175 mL/hr over 120 Minutes Intravenous  Once 04/01/23 1939 04/01/23 2238   04/01/23 1900  cefTRIAXone (ROCEPHIN) 2 g in sodium chloride 0.9 % 100 mL IVPB        2 g 200 mL/hr over 30 Minutes Intravenous Every 24 hours 04/01/23 1857 04/08/23 1759   04/01/23 1900  metroNIDAZOLE (FLAGYL) IVPB 500 mg        500 mg 100 mL/hr over 60 Minutes Intravenous Every 12 hours 04/01/23 1857 04/08/23 1959         Subjective: Patient laying in bed on the telephone.  Denies any chest pain or shortness of breath.  No abdominal pain.  Still with complaints of right foot pain.  Objective: Vitals:   04/01/23 1915 04/01/23 2230 04/01/23 2232 04/02/23 0543  BP: (!) 150/97 (!) 152/100 (!) 138/91 121/76  Pulse: (!) 105 (!) 121 (!) 120 (!) 101  Resp: 18 17 18 18   Temp: 98 F (36.7 C) 98.5 F (36.9 C) 98.5 F (36.9 C) 99.9 F (37.7 C)  TempSrc: Oral Oral Oral Oral  SpO2: 100% 100% 100% 99%  Weight:      Height:        Intake/Output Summary (  Last 24 hours) at 04/02/2023 1331 Last data filed at 04/02/2023 0612 Gross per 24 hour  Intake 1200 ml  Output 900 ml  Net 300 ml   Filed Weights   04/01/23 1524  Weight: 82.6 kg    Examination:  General exam: Appears calm and comfortable  Respiratory system: Clear to auscultation. Respiratory effort normal. Cardiovascular system: S1 & S2 heard, RRR. No JVD, murmurs, rubs, gallops or clicks. No pedal edema. Gastrointestinal system: Abdomen is nondistended, soft and nontender. No organomegaly or  masses felt. Normal bowel sounds heard. Central nervous system: Alert and oriented. No focal neurological deficits. Extremities: Right lower extremity bandaged.  Skin: No rashes, lesions or ulcers Psychiatry: Judgement and insight appear normal. Mood & affect appropriate.     Data Reviewed: I have personally reviewed following labs and imaging studies  CBC: Recent Labs  Lab 04/01/23 1540 04/02/23 0136  WBC 10.6* 11.3*  NEUTROABS 7.7  --   HGB 10.1* 9.1*  HCT 31.5* 27.4*  MCV 88.2 87.8  PLT 365 329    Basic Metabolic Panel: Recent Labs  Lab 04/01/23 1540 04/02/23 0136  NA 131* 133*  K 4.6 4.4  CL 97* 102  CO2 26 23  GLUCOSE 495* 309*  BUN 16 13  CREATININE 1.15 0.91  CALCIUM 9.0 8.7*    GFR: Estimated Creatinine Clearance: 101.9 mL/min (by C-G formula based on SCr of 0.91 mg/dL).  Liver Function Tests: Recent Labs  Lab 04/01/23 1540  AST 16  ALT 19  ALKPHOS 95  BILITOT 0.4  PROT 7.9  ALBUMIN 3.0*    CBG: Recent Labs  Lab 04/01/23 2237 04/02/23 0256 04/02/23 0544 04/02/23 0833 04/02/23 1145  GLUCAP 252* 261* 164* 194* 210*     Recent Results (from the past 240 hour(s))  Blood Cultures x 2 sites     Status: None (Preliminary result)   Collection Time: 04/01/23  3:47 PM   Specimen: BLOOD  Result Value Ref Range Status   Specimen Description BLOOD LEFT ANTECUBITAL  Final   Special Requests   Final    BOTTLES DRAWN AEROBIC AND ANAEROBIC Blood Culture results may not be optimal due to an excessive volume of blood received in culture bottles   Culture   Final    NO GROWTH < 24 HOURS Performed at [facility] Lab, [address] [place], [place] [address]    Report Status PENDING  Incomplete  Blood Cultures x 2 sites     Status: None (Preliminary result)   Collection Time: 04/01/23  6:15 PM   Specimen: BLOOD  Result Value Ref Range Status   Specimen Description BLOOD RIGHT ANTECUBITAL  Final   Special Requests   Final    BOTTLES DRAWN  AEROBIC AND ANAEROBIC Blood Culture results may not be optimal due to an inadequate volume of blood received in culture bottles   Culture   Final    NO GROWTH < 12 HOURS Performed at [facility], [address] [place], [place] [address]    Report Status PENDING  Incomplete         Radiology Studies: MRI Right foot without contrast  Result Date: 04/02/2023 CLINICAL DATA:  Soft tissue infection suspected. EXAM: MRI OF THE RIGHT FOREFOOT WITHOUT CONTRAST TECHNIQUE: Multiplanar, multisequence MR imaging of the right was performed. No intravenous contrast was administered. COMPARISON:  Radiographs dated April 01, 2023 FINDINGS: Bones/Joint/Cartilage Postsurgical changes for prior amputation through the fifth tarsometatarsal joint. Bone marrow edema about the anterior most  cuboid and fourth metatarsal base with joint effusion suggesting osteomyelitis with septic arthritis. Ligaments Lisfranc and collateral ligaments are intact. 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 Soft tissues Skin wound about the lateral aspect of the fourth metatarsal base and dorsal aspect of the fourth metatarsal with surrounding edema and inflammatory changes. No fluid collection or abscess. IMPRESSION: 1. Bone marrow edema about the anterior most cuboid and fourth metatarsal base with joint effusion suggesting osteomyelitis with septic arthritis. 2. Skin wound about the lateral aspect of the fourth metatarsal base and dorsal aspect of the fourth metatarsal with surrounding edema and inflammatory changes. No fluid collection or abscess. 3. 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 4. Postsurgical changes for prior amputation through the fifth tarsometatarsal joint. Electronically Signed   By: Larose Hires D.O.   On: 04/02/2023 12:26   DG Foot Complete Right  Result Date: 04/01/2023 CLINICAL DATA:  Right foot pain and swelling  for 1 day. On antibiotics for possible foot infection. Recent right foot surgery. EXAM: RIGHT FOOT COMPLETE - 3+ VIEW COMPARISON:  Radiographs 02/12/2023.  MRI 02/12/2023. FINDINGS: Patient has undergone interval amputation of the 5th ray at the tarsometatarsal joint. There is ill-defined lucency within the lateral base of the 4th metatarsal and the adjacent lateral aspect of the cuboid, suspicious for osteomyelitis. No other suspicious or acute osseous findings. There are stable mild midfoot degenerative changes. There is lateral forefoot soft tissue swelling without foreign body or soft tissue emphysema. Scattered vascular calcifications are noted. IMPRESSION: 1. Findings suspicious for osteomyelitis involving the base of the 4th metatarsal and the adjacent cuboid. 2. Interval amputation of the 5th ray at the tarsometatarsal joint. Electronically Signed   By: Carey Bullocks M.D.   On: 04/01/2023 16:47        Scheduled Meds:  insulin aspart  0-9 Units Subcutaneous Q4H   insulin NPH Human  20 Units Subcutaneous BID AC & HS   simvastatin  10 mg Oral QPM   Continuous Infusions:  cefTRIAXone (ROCEPHIN)  IV Stopped (04/01/23 1938)   metronidazole 500 mg (04/02/23 0840)     LOS: 1 day    Time spent: 35 minutes    Ramiro Harvest, MD [facility]   To contact the attending provider between 7A-7P or the covering provider during after hours 7P-7A, please log into the web site [email or web address] and access using universal [place] password for that web site. If you do not have the password, please call the hospital operator.  04/02/2023, 1:31 PM

## 2023-04-03 DIAGNOSIS — E11628 Type 2 diabetes mellitus with other skin complications: Secondary | ICD-10-CM

## 2023-04-03 DIAGNOSIS — L089 Local infection of the skin and subcutaneous tissue, unspecified: Secondary | ICD-10-CM

## 2023-04-03 DIAGNOSIS — L97509 Non-pressure chronic ulcer of other part of unspecified foot with unspecified severity: Secondary | ICD-10-CM

## 2023-04-03 DIAGNOSIS — E11621 Type 2 diabetes mellitus with foot ulcer: Secondary | ICD-10-CM

## 2023-04-03 LAB — CBC WITH DIFFERENTIAL/PLATELET
Abs Immature Granulocytes: 0.13 10*3/uL — ABNORMAL HIGH (ref 0.00–0.07)
Basophils Absolute: 0 10*3/uL (ref 0.0–0.1)
Basophils Relative: 0 %
Eosinophils Absolute: 0.4 10*3/uL (ref 0.0–0.5)
Eosinophils Relative: 4 %
HCT: 28 % — ABNORMAL LOW (ref 39.0–52.0)
Hemoglobin: 9.3 g/dL — ABNORMAL LOW (ref 13.0–17.0)
Immature Granulocytes: 1 %
Lymphocytes Relative: 18 %
Lymphs Abs: 1.9 10*3/uL (ref 0.7–4.0)
MCH: 28.6 pg (ref 26.0–34.0)
MCHC: 33.2 g/dL (ref 30.0–36.0)
MCV: 86.2 fL (ref 80.0–100.0)
Monocytes Absolute: 0.9 10*3/uL (ref 0.1–1.0)
Monocytes Relative: 8 %
Neutro Abs: 7.3 10*3/uL (ref 1.7–7.7)
Neutrophils Relative %: 69 %
Platelets: 357 10*3/uL (ref 150–400)
RBC: 3.25 MIL/uL — ABNORMAL LOW (ref 4.22–5.81)
RDW: 13.5 % (ref 11.5–15.5)
WBC: 10.6 10*3/uL — ABNORMAL HIGH (ref 4.0–10.5)
nRBC: 0 % (ref 0.0–0.2)

## 2023-04-03 LAB — GLUCOSE, CAPILLARY
Glucose-Capillary: 174 mg/dL — ABNORMAL HIGH (ref 70–99)
Glucose-Capillary: 126 mg/dL — ABNORMAL HIGH (ref 70–99)
Glucose-Capillary: 149 mg/dL — ABNORMAL HIGH (ref 70–99)
Glucose-Capillary: 216 mg/dL — ABNORMAL HIGH (ref 70–99)
Glucose-Capillary: 89 mg/dL (ref 70–99)

## 2023-04-03 LAB — BASIC METABOLIC PANEL
Anion gap: 10 (ref 5–15)
BUN: 13 mg/dL (ref 6–20)
CO2: 23 mmol/L (ref 22–32)
Calcium: 8.8 mg/dL — ABNORMAL LOW (ref 8.9–10.3)
Chloride: 101 mmol/L (ref 98–111)
Creatinine, Ser: 0.77 mg/dL (ref 0.61–1.24)
GFR, Estimated: >60 mL/min (ref >60)
Glucose, Bld: 226 mg/dL — ABNORMAL HIGH (ref 70–99)
Potassium: 3.8 mmol/L (ref 3.5–5.1)
Sodium: 134 mmol/L — ABNORMAL LOW (ref 135–145)

## 2023-04-03 LAB — MAGNESIUM: Magnesium: 1.4 mg/dL — ABNORMAL LOW (ref 1.7–2.4)

## 2023-04-03 MED ORDER — MAGNESIUM SULFATE 4 GM/100ML IV SOLN
4.0000 g | Freq: Once | INTRAVENOUS | Status: AC
  Administered 2023-04-03: 4 g via INTRAVENOUS
  Filled 2023-04-03: qty 100

## 2023-04-03 MED ORDER — VANCOMYCIN HCL 1250 MG/250ML IV SOLN
1250.0000 mg | Freq: Two times a day (BID) | INTRAVENOUS | Status: DC
  Administered 2023-04-03 – 2023-04-04 (×2): 1250 mg via INTRAVENOUS
  Filled 2023-04-03 (×3): qty 250

## 2023-04-03 NOTE — Evaluation (Signed)
Occupational Therapy Evaluation Patient Details Name: Douglas Murphy MRN: [id number] DOB: 04/08/1966 Today's Date: 04/03/2023   History of Present Illness 57 y.o. male presented 04/01/23 with pain and redness spreading up rt foot (site of recent 5th toe amputation June 2024). Concern for osteomyelitis of 4th metatarsal on xray. MRI suggestive of osteo, however ortho MD states these changes are due to recent 5th metatarsal amputation. PMH significant of DM2 complicated by peripheral neuropathy and foot infections   Clinical Impression   PTA, pt lived alone and intermittently stayed with his mother to help her. Upon eval, pt Mod I for ADL. Pt educated and demonstrating use of compensatory techniques for LB ADL and tub/shower transfers within precautions. All questions answered and education provided. Recommending discharge home with no OT follow up at this time. OT to sign off. Thank you for this order.      Recommendations for follow up therapy are one component of a multi-disciplinary discharge planning process, led by the attending physician.  Recommendations may be updated based on patient status, additional functional criteria and insurance authorization.   Assistance Recommended at Discharge PRN  Patient can return home with the following Other (comment) (on pt request)    Functional Status Assessment  Patient has had a recent decline in their functional status and demonstrates the ability to make significant improvements in function in a reasonable and predictable amount of time.  Equipment Recommendations  None recommended by OT (Pt has shower seat)    Recommendations for Other Services       Precautions / Restrictions Precautions Precautions: Fall Required Braces or Orthoses: Other Brace Other Brace: post-op shoe Restrictions Weight Bearing Restrictions: Yes RLE Weight Bearing: Non weight bearing (per Dr Audrie Lia note)      Mobility Bed Mobility Overal bed mobility:  Independent                  Transfers Overall transfer level: Modified independent Equipment used: Rolling walker (2 wheels)               General transfer comment: initial instructional cues, pt return demonstrated correctly x 2      Balance Overall balance assessment: Modified Independent                                         ADL either performed or assessed with clinical judgement   ADL Overall ADL's : Modified independent                                       General ADL Comments: Educated regarding standing ADL, tub/shower transfer, and LB ADL     Vision Patient Visual Report: No change from baseline Vision Assessment?: No apparent visual deficits     Perception     Praxis      Pertinent Vitals/Pain Pain Assessment Pain Assessment: Faces Faces Pain Scale: Hurts little more Pain Location: rt foot and ankle Pain Descriptors / Indicators: Aching, Other (Comment) (stiffness) Pain Intervention(s): Limited activity within patient's tolerance (educated regarding weightbearing precautions per Dr. Lajoyce Corners)     Hand Dominance Right   Extremity/Trunk Assessment Upper Extremity Assessment Upper Extremity Assessment: Overall WFL for tasks assessed   Lower Extremity Assessment Lower Extremity Assessment: Defer to PT evaluation RLE Deficits / Details: +edema and erythema rt foot and  ankle   Cervical / Trunk Assessment Cervical / Trunk Assessment: Normal   Communication Communication Communication: No difficulties   Cognition Arousal/Alertness: Awake/alert Behavior During Therapy: WFL for tasks assessed/performed Overall Cognitive Status: Within Functional Limits for tasks assessed                                       General Comments       Exercises     Shoulder Instructions      Home Living Family/patient expects to be discharged to:: Private residence Living Arrangements: Parent  (mother) Available Help at Discharge: Family;Available PRN/intermittently (brother) Type of Home: Mobile home Home Access: Stairs to enter;Ramped entrance Entrance Stairs-Number of Steps: 4 Entrance Stairs-Rails: Can reach both Home Layout: One level     Bathroom Shower/Tub: Chief Strategy Officer: Standard Bathroom Accessibility: Yes   Home Equipment: Agricultural consultant (2 wheels);Wheelchair - manual          Prior Functioning/Environment Prior Level of Function : Independent/Modified Independent;Driving             Mobility Comments: uses ramp to enter home; began using his wheelchair recently because of pain in rt foot and trying to stay off his foot ADLs Comments: Indep in ADL and IADL; does not work right now        OT Problem List: Decreased knowledge of precautions;Decreased knowledge of use of DME or AE      OT Treatment/Interventions:      OT Goals(Current goals can be found in the care plan section) Acute Rehab OT Goals Patient Stated Goal: get better OT Goal Formulation: With patient  OT Frequency:      Co-evaluation              AM-PAC OT "6 Clicks" Daily Activity     Outcome Measure Help from another person eating meals?: None Help from another person taking care of personal grooming?: None Help from another person toileting, which includes using toliet, bedpan, or urinal?: None Help from another person bathing (including washing, rinsing, drying)?: None Help from another person to put on and taking off regular upper body clothing?: None Help from another person to put on and taking off regular lower body clothing?: None 6 Click Score: 24   End of Session Equipment Utilized During Treatment: Gait belt;Rolling walker (2 wheels) Nurse Communication: Mobility status  Activity Tolerance: Patient tolerated treatment well Patient left: in chair;with call bell/phone within reach  OT Visit Diagnosis: Pain Pain - Right/Left: Right Pain -  part of body: Ankle and joints of foot                Time: 1551-1611 OT Time Calculation (min): 20 min Charges:  OT General Charges $OT Visit: 1 Visit OT Evaluation $OT Eval Low Complexity: 1 Low  Tyler Deis, OTR/L [facility] Office: [phone]   Myrla Halsted 04/03/2023, 4:22 PM

## 2023-04-03 NOTE — Progress Notes (Signed)
Pharmacy Antibiotic Note  Douglas Murphy is a 57 y.o. male  who is s/p R foot fifth ray amputation and debridement of large abscess 02/2023. Completed an augmentin course and recently started doxycycline course outpatient for worsening foot pain/redness. Around wound site now admitted on 04/01/2023 with  worsened wound infection . Pharmacy has been consulted for vancomycin dosing. Renal function improved. Will adjust vancomycin dose accordingly.   Plan: Vancomycin 1250 mg IV Q12h (eAUC 418, goal AUC 400-550) Ceftriaxone 2g IV Q24h + Flagyl 500mg  IV Q12h per MD Trend WBC, fever, renal function F/u cultures, clinical progress, levels as indicated De-escalate when able   Height: 6' (182.9 cm) Weight: 82.6 kg (182 lb) IBW/kg (Calculated) : 77.6  Temp (24hrs), Avg:98.1 F (36.7 C), Min:97.3 F (36.3 C), Max:98.7 F (37.1 C)  Recent Labs  Lab 04/01/23 1540 04/01/23 2138 04/02/23 0136 04/03/23 0257  WBC 10.6*  --  11.3* 10.6*  CREATININE 1.15  --  0.91 0.77  LATICACIDVEN 2.0* 1.3  --   --     Estimated Creatinine Clearance: 115.9 mL/min (by C-G formula based on SCr of 0.77 mg/dL).    Allergies  Allergen Reactions   Other Anaphylaxis and Other (See Comments)    [place] nuts     Microbiology results: 7/19 BCx: NGTD  Thank you for allowing pharmacy to be a part of this patient's care.  Carrington Clamp 04/03/2023 10:04 AM

## 2023-04-03 NOTE — Evaluation (Signed)
Physical Therapy Evaluation and Discharge Patient Details Name: Douglas Murphy MRN: [id number] DOB: [birth year] Today's Date: 04/03/2023  History of Present Illness  57 y.o. male presented 04/01/23 with pain and redness spreading up rt foot (site of recent 5th toe amputation June 2024). Concern for osteomyelitis of 4th metatarsal on xray. MRI suggestive of osteo, however ortho MD states these changes are due to recent 5th metatarsal amputation. PMH significant of DM2 complicated by peripheral neuropathy and foot infections  Clinical Impression   Patient evaluated by Physical Therapy with no further acute PT needs identified. All education has been completed and the patient has no further questions. Noted Dr. Audrie Lia note stated pt to be NWB RLE and pt able to return demonstrate mobility while maintaining NWB. PT is signing off. Thank you for this referral.         Assistance Recommended at Discharge PRN  If plan is discharge home, recommend the following:  Can travel by private vehicle  Assistance with cooking/housework;Assist for transportation        Equipment Recommendations None recommended by PT  Recommendations for Other Services       Functional Status Assessment Patient has had a recent decline in their functional status and demonstrates the ability to make significant improvements in function in a reasonable and predictable amount of time.     Precautions / Restrictions Precautions Precautions: Fall Required Braces or Orthoses: Other Brace Other Brace: post-op shoe Restrictions Weight Bearing Restrictions: Yes RLE Weight Bearing: Non weight bearing (per Dr Audrie Lia note)      Mobility  Bed Mobility Overal bed mobility: Independent                  Transfers Overall transfer level: Modified independent Equipment used: Rolling walker (2 wheels)               General transfer comment: initial instructional cues, pt return demonstrated correctly x  2    Ambulation/Gait Ambulation/Gait assistance: Supervision, Modified independent (Device/Increase time) Gait Distance (Feet): 40 Feet (30) Assistive device: Rolling walker (2 wheels) Gait Pattern/deviations: Step-to pattern       General Gait Details: initial cues to transition from TDWB to NWB RLE; pt then return demonstrated without difficulty; pt inquired about use of crutches and recommended he continue with use of rW for stability now that he is NWB RLE  Stairs            Wheelchair Mobility     Tilt Bed    Modified Rankin (Stroke Patients Only)       Balance Overall balance assessment: Modified Independent                                           Pertinent Vitals/Pain Pain Assessment Pain Assessment: Faces Faces Pain Scale: Hurts little more Pain Location: rt foot and ankle Pain Descriptors / Indicators: Aching, Other (Comment) (stiffness) Pain Intervention(s): Limited activity within patient's tolerance, Other (comment) (educated on now NWB RLE per Dr Lajoyce Corners)    Home Living Family/patient expects to be discharged to:: Private residence Living Arrangements: Parent (mother) Available Help at Discharge: Family;Available PRN/intermittently (brother) Type of Home: Mobile home Home Access: Stairs to enter;Ramped entrance Entrance Stairs-Rails: Can reach both Entrance Stairs-Number of Steps: 4   Home Layout: One level Home Equipment: Agricultural consultant (2 wheels);Wheelchair - manual      Prior Function Prior Level  of Function : Independent/Modified Independent;Driving             Mobility Comments: uses ramp to enter home; began using his wheelchair recently because of pain in rt foot and trying to stay off his foot       Hand Dominance   Dominant Hand: Right    Extremity/Trunk Assessment   Upper Extremity Assessment Upper Extremity Assessment: Defer to OT evaluation    Lower Extremity Assessment Lower Extremity Assessment:  RLE deficits/detail RLE Deficits / Details: +edema and erythema rt foot and ankle    Cervical / Trunk Assessment Cervical / Trunk Assessment: Normal  Communication   Communication: No difficulties  Cognition Arousal/Alertness: Awake/alert Behavior During Therapy: WFL for tasks assessed/performed Overall Cognitive Status: Within Functional Limits for tasks assessed                                          General Comments      Exercises General Exercises - Lower Extremity Ankle Circles/Pumps: AROM, Right, 10 reps   Assessment/Plan    PT Assessment Patient does not need any further PT services  PT Problem List         PT Treatment Interventions      PT Goals (Current goals can be found in the Care Plan section)  Acute Rehab PT Goals Patient Stated Goal: return home tomorrow PT Goal Formulation: All assessment and education complete, DC therapy    Frequency       Co-evaluation               AM-PAC PT "6 Clicks" Mobility  Outcome Measure Help needed turning from your back to your side while in a flat bed without using bedrails?: None Help needed moving from lying on your back to sitting on the side of a flat bed without using bedrails?: None Help needed moving to and from a bed to a chair (including a wheelchair)?: None Help needed standing up from a chair using your arms (e.g., wheelchair or bedside chair)?: None Help needed to walk in hospital room?: None Help needed climbing 3-5 steps with a railing? : A Little 6 Click Score: 23    End of Session   Activity Tolerance: Patient tolerated treatment well Patient left: in chair;with call bell/phone within reach   PT Visit Diagnosis: Other abnormalities of gait and mobility (R26.89)    Time: 0981-1914 PT Time Calculation (min) (ACUTE ONLY): 14 min   Charges:   PT Evaluation $PT Eval Low Complexity: 1 Low   PT General Charges $$ ACUTE PT VISIT: 1 Visit          Jerolyn Center, PT Acute  Rehabilitation Services  Office [phone]   Zena Amos 04/03/2023, 2:56 PM

## 2023-04-03 NOTE — Progress Notes (Signed)
PROGRESS NOTE    Douglas Murphy  BJS:[id number] DOB: 01/26/1966 DOA: 04/01/2023 PCP: [name], MD    Chief Complaint  Patient presents with   Foot Pain    right    Brief Narrative:  Patient 57 year old gentleman history of type 2 diabetes with peripheral neuropathy, history of foot infections, underwent right fifth toe amputation by Dr. Lajoyce Corners June 2024.  Patient seen in orthopedics office 2 days prior to admission and noted to have developed redness around wound site.  Patient started on doxycycline as an outpatient however due to worsening pain and erythema patient presented to the ED.  Plain films done concerning for osteomyelitis.  MRI ordered.  Patient placed empirically on IV antibiotics.  Orthopedics consulted.   Assessment & Plan:   Principal Problem:   Diabetic infection of right foot (HCC) Active Problems:   Diabetes mellitus (HCC)   Hypomagnesemia   #1 diabetic foot infection right foot -Patient with erythema, redness, pain around amputation site of right fifth toe. -Plain films of the right foot obtained with concern for osteomyelitis of the fourth metatarsal. -Patient noted to have failed outpatient therapy. -MRI right foot obtained with findings concerning for osteomyelitis with septic arthritis around the anteriormost cuboid and fourth metatarsal base, no abscesses noted. -Continue IV vancomycin, IV Rocephin, IV Flagyl. -Orthopedics consultation pending.  2.  Diabetes mellitus type 2 -Hemoglobin A1c 11.3 (02/12/2023) -CBG 174 this morning.  -Continue NPH 20 units twice daily, SSI. -Continue to hold oral hypoglycemic agents. -Diabetes coordinator following.  3.  Hypomagnesemia -Magnesium at 1.4. -Magnesium sulfate 4 g IV x 1. -Repeat labs in the AM.    DVT prophylaxis: SCDs, chemical DVT prophylaxis per orthopedics. Code Status: Full Family Communication: Updated patient.  No family at bedside. Disposition: Likely home when clinically improved and  cleared by orthopedics  Status is: Inpatient Remains inpatient appropriate because: Severity of illness   Consultants:  Orthopedics: Dr. Lajoyce Corners 01/01/2023  Procedures:  Plain films of the right foot 04/01/2023 MRI right foot 04/02/2023  Antimicrobials:  Anti-infectives (From admission, onward)    Start     Dose/Rate Route Frequency Ordered Stop   04/03/23 1700  vancomycin (VANCOREADY) IVPB 1250 mg/250 mL        1,250 mg 166.7 mL/hr over 90 Minutes Intravenous Every 12 hours 04/03/23 1009     04/02/23 1500  vancomycin (VANCOCIN) IVPB 1000 mg/200 mL premix  Status:  Discontinued        1,000 mg 200 mL/hr over 60 Minutes Intravenous Every 12 hours 04/02/23 1441 04/03/23 1009   04/01/23 1945  vancomycin (VANCOREADY) IVPB 1750 mg/350 mL        1,750 mg 175 mL/hr over 120 Minutes Intravenous  Once 04/01/23 1939 04/01/23 2238   04/01/23 1900  cefTRIAXone (ROCEPHIN) 2 g in sodium chloride 0.9 % 100 mL IVPB        2 g 200 mL/hr over 30 Minutes Intravenous Every 24 hours 04/01/23 1857 04/08/23 1759   04/01/23 1900  metroNIDAZOLE (FLAGYL) IVPB 500 mg        500 mg 100 mL/hr over 60 Minutes Intravenous Every 12 hours 04/01/23 1857 04/08/23 1959         Subjective: Laying in bed.  Denies any chest pain or shortness of breath.  No significant abdominal pain.  Still with some swelling and erythema on the right foot but slowly improving.  Asking whether dressing needs to be changed.    Objective: Vitals:   04/02/23 1713 04/02/23 2027 04/03/23  0424 04/03/23 0847  BP: (!) 134/92 (!) 141/90 112/84 110/76  Pulse: 89 93 78 91  Resp: 18 20 20 18   Temp: (!) 97.3 F (36.3 C) 98.7 F (37.1 C) 97.9 F (36.6 C) 98.3 F (36.8 C)  TempSrc: Oral   Oral  SpO2: 100% 100% 98% 99%  Weight:      Height:        Intake/Output Summary (Last 24 hours) at 04/03/2023 1620 Last data filed at 04/03/2023 1414 Gross per 24 hour  Intake 767.53 ml  Output 1125 ml  Net -357.47 ml   Filed Weights   04/01/23  1524  Weight: 82.6 kg    Examination:  General exam: NAD Respiratory system: CTAB.  No wheezes, no crackles, no rhonchi.  Fair air movement.  Speaking in full sentences.  Cardiovascular system: RRR no murmurs rubs or gallops.  No JVD.  No lower extremity edema.  Gastrointestinal system: Abdomen is nondistended, soft and nontender. No organomegaly or masses felt. Normal bowel sounds heard. Central nervous system: Alert and oriented. No focal neurological deficits. Extremities: Right lower extremity/right foot with some erythema, some swelling, some tenderness to palpation. Skin: No rashes, lesions or ulcers Psychiatry: Judgement and insight appear normal. Mood & affect appropriate.     Data Reviewed: I have personally reviewed following labs and imaging studies  CBC: Recent Labs  Lab 04/01/23 1540 04/02/23 0136 04/03/23 0257  WBC 10.6* 11.3* 10.6*  NEUTROABS 7.7  --  7.3  HGB 10.1* 9.1* 9.3*  HCT 31.5* 27.4* 28.0*  MCV 88.2 87.8 86.2  PLT 365 329 357    Basic Metabolic Panel: Recent Labs  Lab 04/01/23 1540 04/02/23 0136 04/03/23 0257  NA 131* 133* 134*  K 4.6 4.4 3.8  CL 97* 102 101  CO2 26 23 23   GLUCOSE 495* 309* 226*  BUN 16 13 13   CREATININE 1.15 0.91 0.77  CALCIUM 9.0 8.7* 8.8*  MG  --   --  1.4*    GFR: Estimated Creatinine Clearance: 115.9 mL/min (by C-G formula based on SCr of 0.77 mg/dL).  Liver Function Tests: Recent Labs  Lab 04/01/23 1540  AST 16  ALT 19  ALKPHOS 95  BILITOT 0.4  PROT 7.9  ALBUMIN 3.0*    CBG: Recent Labs  Lab 04/02/23 2029 04/02/23 2358 04/03/23 0426 04/03/23 0759 04/03/23 1124  GLUCAP 199* 115* 174* 126* 149*     Recent Results (from the past 240 hour(s))  Blood Cultures x 2 sites     Status: None (Preliminary result)   Collection Time: 04/01/23  3:47 PM   Specimen: BLOOD  Result Value Ref Range Status   Specimen Description BLOOD LEFT ANTECUBITAL  Final   Special Requests   Final    BOTTLES DRAWN  AEROBIC AND ANAEROBIC Blood Culture results may not be optimal due to an excessive volume of blood received in culture bottles   Culture   Final    NO GROWTH 2 DAYS Performed at [facility], [address], [place], [place] [address]    Report Status PENDING  Incomplete  Blood Cultures x 2 sites     Status: None (Preliminary result)   Collection Time: 04/01/23  6:15 PM   Specimen: BLOOD  Result Value Ref Range Status   Specimen Description BLOOD RIGHT ANTECUBITAL  Final   Special Requests   Final    BOTTLES DRAWN AEROBIC AND ANAEROBIC Blood Culture results may not be optimal due to an inadequate volume of blood received  in culture bottles   Culture   Final    NO GROWTH 2 DAYS Performed at [facility], [address], [place], [place] [address]    Report Status PENDING  Incomplete         Radiology Studies: MRI Right foot without contrast  Result Date: 04/02/2023 CLINICAL DATA:  Soft tissue infection suspected. EXAM: MRI OF THE RIGHT FOREFOOT WITHOUT CONTRAST TECHNIQUE: Multiplanar, multisequence MR imaging of the right was performed. No intravenous contrast was administered. COMPARISON:  Radiographs dated April 01, 2023 FINDINGS: Bones/Joint/Cartilage Postsurgical changes for prior amputation through the fifth tarsometatarsal joint. Bone marrow edema about the anterior most cuboid and fourth metatarsal base with joint effusion suggesting osteomyelitis with septic arthritis. Ligaments Lisfranc and collateral ligaments are intact. 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 Soft tissues Skin wound about the lateral aspect of the fourth metatarsal base and dorsal aspect of the fourth metatarsal with surrounding edema and inflammatory changes. No fluid collection or abscess. IMPRESSION: 1. Bone marrow edema about the anterior most cuboid and fourth metatarsal base with joint effusion suggesting osteomyelitis with septic  arthritis. 2. Skin wound about the lateral aspect of the fourth metatarsal base and dorsal aspect of the fourth metatarsal with surrounding edema and inflammatory changes. No fluid collection or abscess. 3. 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 4. Postsurgical changes for prior amputation through the fifth tarsometatarsal joint. Electronically Signed   By: Larose Hires D.O.   On: 04/02/2023 12:26   DG Foot Complete Right  Result Date: 04/01/2023 CLINICAL DATA:  Right foot pain and swelling for 1 day. On antibiotics for possible foot infection. Recent right foot surgery. EXAM: RIGHT FOOT COMPLETE - 3+ VIEW COMPARISON:  Radiographs 02/12/2023.  MRI 02/12/2023. FINDINGS: Patient has undergone interval amputation of the 5th ray at the tarsometatarsal joint. There is ill-defined lucency within the lateral base of the 4th metatarsal and the adjacent lateral aspect of the cuboid, suspicious for osteomyelitis. No other suspicious or acute osseous findings. There are stable mild midfoot degenerative changes. There is lateral forefoot soft tissue swelling without foreign body or soft tissue emphysema. Scattered vascular calcifications are noted. IMPRESSION: 1. Findings suspicious for osteomyelitis involving the base of the 4th metatarsal and the adjacent cuboid. 2. Interval amputation of the 5th ray at the tarsometatarsal joint. Electronically Signed   By: Carey Bullocks M.D.   On: 04/01/2023 16:47        Scheduled Meds:  enoxaparin (LOVENOX) injection  40 mg Subcutaneous Q24H   insulin aspart  0-9 Units Subcutaneous Q4H   insulin NPH Human  20 Units Subcutaneous BID AC & HS   simvastatin  10 mg Oral QPM   Continuous Infusions:  cefTRIAXone (ROCEPHIN)  IV 2 g (04/02/23 1805)   metronidazole 500 mg (04/03/23 0850)   vancomycin       LOS: 2 days    Time spent: 35 minutes    Ramiro Harvest, MD [facility]   To contact the attending provider  between 7A-7P or the covering provider during after hours 7P-7A, please log into the web site [email or web address] and access using universal [place] password for that web site. If you do not have the password, please call the hospital operator.  04/03/2023, 4:20 PM

## 2023-04-04 ENCOUNTER — Other Ambulatory Visit (HOSPITAL_COMMUNITY): Payer: Self-pay

## 2023-04-04 DIAGNOSIS — E11628 Type 2 diabetes mellitus with other skin complications: Secondary | ICD-10-CM | POA: Diagnosis not present

## 2023-04-04 DIAGNOSIS — L089 Local infection of the skin and subcutaneous tissue, unspecified: Secondary | ICD-10-CM | POA: Diagnosis not present

## 2023-04-04 DIAGNOSIS — E11621 Type 2 diabetes mellitus with foot ulcer: Secondary | ICD-10-CM | POA: Diagnosis not present

## 2023-04-04 LAB — GLUCOSE, CAPILLARY
Glucose-Capillary: 144 mg/dL — ABNORMAL HIGH (ref 70–99)
Glucose-Capillary: 176 mg/dL — ABNORMAL HIGH (ref 70–99)
Glucose-Capillary: 294 mg/dL — ABNORMAL HIGH (ref 70–99)
Glucose-Capillary: 59 mg/dL — ABNORMAL LOW (ref 70–99)
Glucose-Capillary: 78 mg/dL (ref 70–99)

## 2023-04-04 LAB — BASIC METABOLIC PANEL
Anion gap: 9 (ref 5–15)
BUN: 14 mg/dL (ref 6–20)
CO2: 26 mmol/L (ref 22–32)
Calcium: 9.1 mg/dL (ref 8.9–10.3)
Chloride: 104 mmol/L (ref 98–111)
Creatinine, Ser: 0.79 mg/dL (ref 0.61–1.24)
GFR, Estimated: >60 mL/min (ref >60)
Glucose, Bld: 69 mg/dL — ABNORMAL LOW (ref 70–99)
Potassium: 3.8 mmol/L (ref 3.5–5.1)
Sodium: 139 mmol/L (ref 135–145)

## 2023-04-04 LAB — CBC
HCT: 28 % — ABNORMAL LOW (ref 39.0–52.0)
Hemoglobin: 9 g/dL — ABNORMAL LOW (ref 13.0–17.0)
MCH: 28 pg (ref 26.0–34.0)
MCHC: 32.1 g/dL (ref 30.0–36.0)
MCV: 87 fL (ref 80.0–100.0)
Platelets: 406 10*3/uL — ABNORMAL HIGH (ref 150–400)
RBC: 3.22 MIL/uL — ABNORMAL LOW (ref 4.22–5.81)
RDW: 13.7 % (ref 11.5–15.5)
WBC: 11.3 10*3/uL — ABNORMAL HIGH (ref 4.0–10.5)
nRBC: 0 % (ref 0.0–0.2)

## 2023-04-04 LAB — MAGNESIUM: Magnesium: 2 mg/dL (ref 1.7–2.4)

## 2023-04-04 LAB — CULTURE, BLOOD (ROUTINE X 2)

## 2023-04-04 MED ORDER — AMOXICILLIN-POT CLAVULANATE 875-125 MG PO TABS
1.0000 | ORAL_TABLET | Freq: Two times a day (BID) | ORAL | 0 refills | Status: AC
  Filled 2023-04-04: qty 8, 4d supply, fill #0

## 2023-04-04 MED ORDER — AMOXICILLIN-POT CLAVULANATE 875-125 MG PO TABS
1.0000 | ORAL_TABLET | Freq: Two times a day (BID) | ORAL | Status: DC
  Administered 2023-04-04: 1 via ORAL
  Filled 2023-04-04 (×2): qty 1

## 2023-04-04 NOTE — Inpatient Diabetes Management (Signed)
Inpatient Diabetes Program Recommendations  AACE/ADA: New Consensus Statement on Inpatient Glycemic Control (2015)  Target Ranges:  Prepandial:   less than 140 mg/dL      Peak postprandial:   less than 180 mg/dL (1-2 hours)      Critically ill patients:  140 - 180 mg/dL   Lab Results  Component Value Date   GLUCAP 144 (H) 04/04/2023   HGBA1C 11.3 (H) 02/12/2023    Review of Glycemic Control  Latest Reference Range & Units 04/03/23 07:59 04/03/23 11:24 04/03/23 16:22 04/03/23 20:14 04/03/23 23:58 04/04/23 04:28 04/04/23 04:50 04/04/23 07:23  Glucose-Capillary 70 - 99 mg/dL 175 (H) 102 (H) 585 (H) 89 176 (H) 59 (L) 78 144 (H)   Diabetes history: DM 2 Outpatient Diabetes medications: 70/30 30 units bid, metformin 1000 mg bid Current orders for Inpatient glycemic control:  NPH 20 units bid Novolog 0-9 units Q4 hours  A1c 11.3% on 6/1 Glucose 495 on presentation  Inpatient Diabetes Program Recommendations:    Note: pt was seen right after A1c was obtained on 6/3 refer to note. Pt was without insurance and is getting insulin from [employer]. Pt requested to go to one of our clinic due to being uninsured so he could follow up with a provider. Pt was seeing a physician at [facility].  -   Glucose trends are okay at inpt goal. Watch trends.  Thanks,  Christena Deem RN, MSN, BC-ADM Inpatient Diabetes Coordinator Team Pager [phone] (8a-5p)

## 2023-04-04 NOTE — Progress Notes (Signed)
Hypoglycemic Event  CBG:   Latest Reference Range & Units 04/04/23 04:28  Glucose-Capillary 70 - 99 mg/dL 59 (L)  (L): Data is abnormally low Treatment: 4 oz juice/soda  Symptoms: None  Follow-up CBG: Time:  CBG Result:  Latest Reference Range & Units 04/04/23 04:50  Glucose-Capillary 70 - 99 mg/dL 78    Possible Reasons for Event: Inadequate meal intake  Comments/MD notified:    Carmin Muskrat

## 2023-04-04 NOTE — Plan of Care (Signed)
  Problem: Nutritional: Goal: Progress toward achieving an optimal weight will improve Outcome: Completed/Met

## 2023-04-04 NOTE — Discharge Summary (Signed)
Physician Discharge Summary  Douglas Murphy ZOX:[id number] DOB: 04/21/1966 DOA: 04/01/2023  PCP: [name], MD  Admit date: 04/01/2023 Discharge date: 04/04/2023  Time spent: 60 minutes  Recommendations for Outpatient Follow-up:  Follow-up with Dr. Lajoyce Corners, orthopedics in 1 week. Follow-up with [name], MD in 2 weeks.  On follow-up patient will need a basic metabolic profile, magnesium level done to follow-up on electrolytes and renal function.  Patient's diabetes will need to be followed up upon.   Discharge Diagnoses:  Principal Problem:   Diabetic infection of right foot (HCC) Active Problems:   Diabetes mellitus (HCC)   Hypomagnesemia   Discharge Condition: Stable and improved.  Diet recommendation: Carb modified diet  Filed Weights   04/01/23 1524  Weight: 82.6 kg    History of present illness:  HPI per Dr. Currie Paris Douglas Murphy is a 57 y.o. male with medical history significant of DM2 complicated by peripheral neuropathy and foot infections.  He underwent a right fifth toe amputation with Dr.Duda Cyndia Skeeters in June 2024.  Seen in office x2 days ago: wound healing but developing redness around wound site.  Pt started on doxycycline as outpt for this and taking for past 2 days.  He reports the pain and redness continued to spread up his foot. He denies fevers or chills.   Hospital Course:  #1 diabetic foot infection right foot -Patient with erythema, redness, pain around amputation site of right fifth toe. -Plain films of the right foot obtained with concern for osteomyelitis of the fourth metatarsal. -Patient noted to have concerns to have failed outpatient therapy on admission.. -MRI right foot obtained with findings concerning for osteomyelitis with septic arthritis around the anteriormost cuboid and fourth metatarsal base, no abscesses noted. -Patient initially placed on IV vancomycin, IV Rocephin, IV Flagyl with clinical improvement. -Patient seen in  consultation by orthopedics Dr. Lajoyce Corners who reviewed the films and felt destructive changes noted on MRI was where bone was resected with a rongeur and still edema and bone loss are consistent with bony resection from surgery and not necessarily osteomyelitis. -Patient improved clinically on IV antibiotics and cleared by orthopedics for discharge. -Patient with discharge home on 4 more days of Augmentin to complete a 7-day course of treatment in addition to doxycycline that patient was on prior to admission. -Outpatient follow-up with Dr. Lajoyce Corners, orthopedics in 1 week.   2.  Diabetes mellitus type 2 -Hemoglobin A1c 11.3 (02/12/2023) -Patient placed on NPH 10 units twice daily and dose uptitrated to 20 units twice daily once no surgical procedure was anticipated during the hospitalization.   -Patient's oral hypoglycemic agents were held and patient seen by diabetic coordinator during the hospitalization.   -Outpatient follow-up with PCP.    3.  Hypomagnesemia -Magnesium at 1.4. -Repleted during the hospitalization. -Outpatient follow-up with PCP.  Procedures: Plain films of the right foot 04/01/2023 MRI right foot 04/02/2023    Consultations: Orthopedics: Dr. Lajoyce Corners 04/02/2023  Discharge Exam: Vitals:   04/03/23 2010 04/04/23 0426  BP: 134/87 110/84  Pulse: 82 77  Resp: 20 18  Temp: 98.2 F (36.8 C) 98.6 F (37 C)  SpO2: 100% 98%    General: NAD Cardiovascular: RRR no murmurs rubs or gallops.  No JVD.  No lower extremity edema. Respiratory: Clear to auscultation bilaterally.  No wheezes, no crackles, no rhonchi.  Fair air movement.  Speaking in full sentences.  Discharge Instructions   Discharge Instructions     Diet Carb Modified   Complete by: As  directed    Increase activity slowly   Complete by: As directed       Allergies as of 04/04/2023       Reactions   Other Anaphylaxis, Other (See Comments)   [place] nuts        Medication List     TAKE these medications     amoxicillin-clavulanate 875-125 MG tablet Commonly known as: AUGMENTIN Take 1 tablet by mouth every 12 (twelve) hours for 4 days.   doxycycline 100 MG tablet Commonly known as: VIBRA-TABS Take 1 tablet (100 mg total) by mouth 2 (two) times daily.   insulin NPH-regular Human (70-30) 100 UNIT/ML injection Inject 30 Units into the skin 2 (two) times daily.   metFORMIN 1000 MG tablet Commonly known as: GLUCOPHAGE Take 1,000 mg by mouth 2 (two) times daily.   simvastatin 10 MG tablet Commonly known as: ZOCOR Take 10 mg by mouth every evening.       Allergies  Allergen Reactions   Other Anaphylaxis and Other (See Comments)    [place] nuts     Follow-up Information     Nadara Mustard, MD Follow up in 1 week(s).   Specialty: Orthopedic Surgery Contact information: [address] [place] [address] [phone]         [name], MD. Schedule an appointment as soon as possible for a visit in 2 week(s).   Specialty: Internal Medicine Contact information: [address] [place] [address] [phone]                  The results of significant diagnostics from this hospitalization (including imaging, microbiology, ancillary and laboratory) are listed below for reference.    Significant Diagnostic Studies: MRI Right foot without contrast  Result Date: 04/02/2023 CLINICAL DATA:  Soft tissue infection suspected. EXAM: MRI OF THE RIGHT FOREFOOT WITHOUT CONTRAST TECHNIQUE: Multiplanar, multisequence MR imaging of the right was performed. No intravenous contrast was administered. COMPARISON:  Radiographs dated April 01, 2023 FINDINGS: Bones/Joint/Cartilage Postsurgical changes for prior amputation through the fifth tarsometatarsal joint. Bone marrow edema about the anterior most cuboid and fourth metatarsal base with joint effusion suggesting osteomyelitis with septic arthritis. Ligaments Lisfranc and collateral ligaments are intact. 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 Soft tissues Skin wound about the lateral aspect of the fourth metatarsal base and dorsal aspect of the fourth metatarsal with surrounding edema and inflammatory changes. No fluid collection or abscess. IMPRESSION: 1. Bone marrow edema about the anterior most cuboid and fourth metatarsal base with joint effusion suggesting osteomyelitis with septic arthritis. 2. Skin wound about the lateral aspect of the fourth metatarsal base and dorsal aspect of the fourth metatarsal with surrounding edema and inflammatory changes. No fluid collection or abscess. 3. 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 4. Postsurgical changes for prior amputation through the fifth tarsometatarsal joint. Electronically Signed   By: Larose Hires D.O.   On: 04/02/2023 12:26   DG Foot Complete Right  Result Date: 04/01/2023 CLINICAL DATA:  Right foot pain and swelling for 1 day. On antibiotics for possible foot infection. Recent right foot surgery. EXAM: RIGHT FOOT COMPLETE - 3+ VIEW COMPARISON:  Radiographs 02/12/2023.  MRI 02/12/2023. FINDINGS: Patient has undergone interval amputation of the 5th ray at the tarsometatarsal joint. There is ill-defined lucency within the lateral base of the 4th metatarsal and the adjacent lateral aspect of the cuboid, suspicious for osteomyelitis. No other suspicious or acute osseous findings. There  are stable mild midfoot degenerative changes. There is lateral forefoot soft tissue swelling without foreign body or soft tissue emphysema. Scattered vascular calcifications are noted. IMPRESSION: 1. Findings suspicious for osteomyelitis involving the base of the 4th metatarsal and the adjacent cuboid. 2. Interval amputation of the 5th ray at the tarsometatarsal joint. Electronically Signed   By: Carey Bullocks M.D.   On: 04/01/2023 16:47    Microbiology: Recent Results (from the past 240  hour(s))  Blood Cultures x 2 sites     Status: None (Preliminary result)   Collection Time: 04/01/23  3:47 PM   Specimen: BLOOD  Result Value Ref Range Status   Specimen Description BLOOD LEFT ANTECUBITAL  Final   Special Requests   Final    BOTTLES DRAWN AEROBIC AND ANAEROBIC Blood Culture results may not be optimal due to an excessive volume of blood received in culture bottles   Culture   Final    NO GROWTH 3 DAYS Performed at [facility], [address] [place], [place] [address]    Report Status PENDING  Incomplete  Blood Cultures x 2 sites     Status: None (Preliminary result)   Collection Time: 04/01/23  6:15 PM   Specimen: BLOOD  Result Value Ref Range Status   Specimen Description BLOOD RIGHT ANTECUBITAL  Final   Special Requests   Final    BOTTLES DRAWN AEROBIC AND ANAEROBIC Blood Culture results may not be optimal due to an inadequate volume of blood received in culture bottles   Culture   Final    NO GROWTH 3 DAYS Performed at [facility], [address] [place], [place] [address]    Report Status PENDING  Incomplete     Labs: Basic Metabolic Panel: Recent Labs  Lab 04/01/23 1540 04/02/23 0136 04/03/23 0257 04/04/23 0253  NA 131* 133* 134* 139  K 4.6 4.4 3.8 3.8  CL 97* 102 101 104  CO2 26 23 23 26   GLUCOSE 495* 309* 226* 69*  BUN 16 13 13 14   CREATININE 1.15 0.91 0.77 0.79  CALCIUM 9.0 8.7* 8.8* 9.1  MG  --   --  1.4* 2.0   Liver Function Tests: Recent Labs  Lab 04/01/23 1540  AST 16  ALT 19  ALKPHOS 95  BILITOT 0.4  PROT 7.9  ALBUMIN 3.0*   No results for input(s): "LIPASE", "AMYLASE" in the last 168 hours. No results for input(s): "AMMONIA" in the last 168 hours. CBC: Recent Labs  Lab 04/01/23 1540 04/02/23 0136 04/03/23 0257 04/04/23 0253  WBC 10.6* 11.3* 10.6* 11.3*  NEUTROABS 7.7  --  7.3  --   HGB 10.1* 9.1* 9.3* 9.0*  HCT 31.5* 27.4* 28.0* 28.0*  MCV 88.2 87.8 86.2 87.0  PLT 365 329 357 406*   Cardiac  Enzymes: No results for input(s): "CKTOTAL", "CKMB", "CKMBINDEX", "TROPONINI" in the last 168 hours. BNP: BNP (last 3 results) No results for input(s): "BNP" in the last 8760 hours.  ProBNP (last 3 results) No results for input(s): "PROBNP" in the last 8760 hours.  CBG: Recent Labs  Lab 04/03/23 2358 04/04/23 0428 04/04/23 0450 04/04/23 0723 04/04/23 1125  GLUCAP 176* 59* 78 144* 294*       Signed:  Ramiro Harvest MD.  [facility] 04/04/2023, 12:42 PM

## 2023-04-04 NOTE — TOC Transition Note (Addendum)
Transition of Care [facility]) - CM/SW Discharge Note   Patient Details  Name: Douglas Murphy MRN: [id number] Date of Birth: 11-26-1965  [facility]) CM/SW Contact:  Tom-Johnson, Hershal Coria, RN Phone Number: 04/04/2023, 12:43 PM   Clinical Narrative:     Patient is scheduled for discharge today.  Readmission Risk Assessment done. Hospital f/u and discharge instructions on AVS. Prescriptions sent to [facility] pharmacy and meds will be delivered to patient at bedside prior discharge. No TOC needs or recommendations noted, no PT/OT f/u noted. Family to transport at discharge.  No further TOC needs noted.      Final next level of care: Home/Self Care Barriers to Discharge: Barriers Resolved   Patient Goals and CMS Choice CMS Medicare.gov Compare Post Acute Care list provided to:: Patient Choice offered to / list presented to : NA  Discharge Placement                  Patient to be transferred to facility by: Family      Discharge Plan and Services Additional resources added to the After Visit Summary for                  DME Arranged: N/A DME Agency: NA       HH Arranged: NA HH Agency: NA        Social Determinants of Health (SDOH) Interventions SDOH Screenings   Food Insecurity: No Food Insecurity (04/01/2023)  Housing: Low Risk  (04/01/2023)  Transportation Needs: No Transportation Needs (04/01/2023)  Utilities: Not At Risk (04/01/2023)  Tobacco Use: Low Risk  (04/01/2023)     Readmission Risk Interventions    04/04/2023   12:42 PM  Readmission Risk Prevention Plan  Post Dischage Appt Complete  Medication Screening Complete  Transportation Screening Complete

## 2023-04-04 NOTE — Progress Notes (Signed)
Patient ID: Douglas Murphy, male   DOB: 1966-04-17, 57 y.o.   MRN: [id number] The redness and swelling continues to improve in his right foot.  Wound bed continues to show decreased size healthy granulation tissue.  Anticipate patient could discharge to home.  He has doxycycline at home.  Again discussed the importance of nonweightbearing and elevation to decrease the redness and swelling.  I will follow-up in 1 week.

## 2023-04-04 NOTE — Progress Notes (Addendum)
DISCHARGE NOTE HOME Shaheed Schmuck to be discharged Home per MD order. Discussed prescriptions and follow up appointments with the patient. Prescriptions given to patient; medication list explained in detail. Patient verbalized understanding.  Skin clean, dry and intact without evidence of skin break down, no evidence of skin tears noted. IV catheter discontinued intact. Site without signs and symptoms of complications. Dressing and pressure applied. Pt denies pain at the site currently. No complaints noted.  Patient free of lines, drains, and wounds.   An After Visit Summary (AVS) was printed and given to the patient. Patient escorted via wheelchair, and discharged home via private auto.  Velia Meyer, RN

## 2023-04-05 ENCOUNTER — Other Ambulatory Visit: Payer: Self-pay | Admitting: Internal Medicine

## 2023-04-05 DIAGNOSIS — E785 Hyperlipidemia, unspecified: Secondary | ICD-10-CM

## 2023-04-05 LAB — CULTURE, BLOOD (ROUTINE X 2): Culture: NO GROWTH

## 2023-04-06 ENCOUNTER — Encounter: Payer: Medicaid Other | Admitting: Family

## 2023-04-06 LAB — CULTURE, BLOOD (ROUTINE X 2): Culture: NO GROWTH

## 2023-04-14 ENCOUNTER — Ambulatory Visit (INDEPENDENT_AMBULATORY_CARE_PROVIDER_SITE_OTHER): Payer: Medicaid Other | Admitting: Orthopedic Surgery

## 2023-04-14 DIAGNOSIS — L089 Local infection of the skin and subcutaneous tissue, unspecified: Secondary | ICD-10-CM

## 2023-04-14 DIAGNOSIS — E11628 Type 2 diabetes mellitus with other skin complications: Secondary | ICD-10-CM

## 2023-04-28 ENCOUNTER — Ambulatory Visit: Payer: Medicaid Other | Admitting: Orthopedic Surgery

## 2023-04-28 DIAGNOSIS — Z89421 Acquired absence of other right toe(s): Secondary | ICD-10-CM

## 2023-04-29 ENCOUNTER — Encounter: Payer: Self-pay | Admitting: Orthopedic Surgery
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
## 2023-06-02 ENCOUNTER — Ambulatory Visit (INDEPENDENT_AMBULATORY_CARE_PROVIDER_SITE_OTHER): Payer: Medicaid Other | Admitting: Orthopedic Surgery

## 2023-06-02 ENCOUNTER — Ambulatory Visit: Payer: Medicaid Other | Admitting: Orthopedic Surgery

## 2023-06-02 DIAGNOSIS — Z89421 Acquired absence of other right toe(s): Secondary | ICD-10-CM | POA: Diagnosis not present

## 2023-06-02 DIAGNOSIS — L97511 Non-pressure chronic ulcer of other part of right foot limited to breakdown of skin: Secondary | ICD-10-CM | POA: Diagnosis not present

## 2023-06-06 ENCOUNTER — Encounter: Payer: Self-pay | Admitting: Orthopedic Surgery
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
## 2023-06-30 ENCOUNTER — Ambulatory Visit (INDEPENDENT_AMBULATORY_CARE_PROVIDER_SITE_OTHER): Payer: 59 | Admitting: Orthopedic Surgery

## 2023-06-30 DIAGNOSIS — L97511 Non-pressure chronic ulcer of other part of right foot limited to breakdown of skin: Secondary | ICD-10-CM

## 2023-06-30 DIAGNOSIS — Z89421 Acquired absence of other right toe(s): Secondary | ICD-10-CM

## 2023-07-05 ENCOUNTER — Encounter: Payer: Self-pay | Admitting: Orthopedic Surgery
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
## 2023-07-18 ENCOUNTER — Emergency Department (HOSPITAL_COMMUNITY)
Admission: EM | Admit: 2023-07-18 | Discharge: 2023-07-18 | Disposition: A | Discharge status: Home / Self Care | Payer: 59 | Attending: Emergency Medicine | Admitting: Emergency Medicine

## 2023-07-18 ENCOUNTER — Emergency Department (HOSPITAL_COMMUNITY): Payer: 59

## 2023-07-18 ENCOUNTER — Other Ambulatory Visit: Payer: Self-pay

## 2023-07-18 ENCOUNTER — Emergency Department (HOSPITAL_BASED_OUTPATIENT_CLINIC_OR_DEPARTMENT_OTHER): Payer: 59

## 2023-07-18 DIAGNOSIS — Z89412 Acquired absence of left great toe: Secondary | ICD-10-CM | POA: Diagnosis not present

## 2023-07-18 DIAGNOSIS — Z79899 Other long term (current) drug therapy: Secondary | ICD-10-CM | POA: Diagnosis not present

## 2023-07-18 DIAGNOSIS — L97529 Non-pressure chronic ulcer of other part of left foot with unspecified severity: Secondary | ICD-10-CM | POA: Insufficient documentation

## 2023-07-18 DIAGNOSIS — Z7984 Long term (current) use of oral hypoglycemic drugs: Secondary | ICD-10-CM | POA: Insufficient documentation

## 2023-07-18 DIAGNOSIS — E11621 Type 2 diabetes mellitus with foot ulcer: Secondary | ICD-10-CM | POA: Diagnosis not present

## 2023-07-18 DIAGNOSIS — D72829 Elevated white blood cell count, unspecified: Secondary | ICD-10-CM | POA: Insufficient documentation

## 2023-07-18 DIAGNOSIS — I10 Essential (primary) hypertension: Secondary | ICD-10-CM | POA: Insufficient documentation

## 2023-07-18 DIAGNOSIS — D649 Anemia, unspecified: Secondary | ICD-10-CM | POA: Diagnosis not present

## 2023-07-18 DIAGNOSIS — E1165 Type 2 diabetes mellitus with hyperglycemia: Secondary | ICD-10-CM | POA: Diagnosis not present

## 2023-07-18 DIAGNOSIS — M79662 Pain in left lower leg: Secondary | ICD-10-CM | POA: Diagnosis not present

## 2023-07-18 DIAGNOSIS — M79605 Pain in left leg: Secondary | ICD-10-CM

## 2023-07-18 DIAGNOSIS — M7732 Calcaneal spur, left foot: Secondary | ICD-10-CM | POA: Diagnosis not present

## 2023-07-18 DIAGNOSIS — M79672 Pain in left foot: Secondary | ICD-10-CM | POA: Diagnosis not present

## 2023-07-18 LAB — BASIC METABOLIC PANEL
Anion gap: 9 (ref 5–15)
BUN: 15 mg/dL (ref 6–20)
CO2: 24 mmol/L (ref 22–32)
Calcium: 9.4 mg/dL (ref 8.9–10.3)
Chloride: 105 mmol/L (ref 98–111)
Creatinine, Ser: 0.84 mg/dL (ref 0.61–1.24)
GFR, Estimated: >60 mL/min (ref >60)
Glucose, Bld: 159 mg/dL — ABNORMAL HIGH (ref 70–99)
Potassium: 4.3 mmol/L (ref 3.5–5.1)
Sodium: 138 mmol/L (ref 135–145)

## 2023-07-18 LAB — CBC WITH DIFFERENTIAL/PLATELET
Abs Immature Granulocytes: 0.05 10*3/uL (ref 0.00–0.07)
Basophils Absolute: 0.1 10*3/uL (ref 0.0–0.1)
Basophils Relative: 1 %
Eosinophils Absolute: 0.6 10*3/uL — ABNORMAL HIGH (ref 0.0–0.5)
Eosinophils Relative: 6 %
HCT: 39.7 % (ref 39.0–52.0)
Hemoglobin: 12.4 g/dL — ABNORMAL LOW (ref 13.0–17.0)
Immature Granulocytes: 1 %
Lymphocytes Relative: 16 %
Lymphs Abs: 1.7 10*3/uL (ref 0.7–4.0)
MCH: 26.2 pg (ref 26.0–34.0)
MCHC: 31.2 g/dL (ref 30.0–36.0)
MCV: 83.8 fL (ref 80.0–100.0)
Monocytes Absolute: 0.7 10*3/uL (ref 0.1–1.0)
Monocytes Relative: 7 %
Neutro Abs: 7.7 10*3/uL (ref 1.7–7.7)
Neutrophils Relative %: 69 %
Platelets: 284 10*3/uL (ref 150–400)
RBC: 4.74 MIL/uL (ref 4.22–5.81)
RDW: 14.6 % (ref 11.5–15.5)
WBC: 10.9 10*3/uL — ABNORMAL HIGH (ref 4.0–10.5)
nRBC: 0 % (ref 0.0–0.2)

## 2023-07-18 LAB — CK: Total CK: 77 U/L (ref 49–397)

## 2023-07-18 MED ORDER — HYDROCODONE-ACETAMINOPHEN 5-325 MG PO TABS
1.0000 | ORAL_TABLET | Freq: Once | ORAL | Status: AC
  Administered 2023-07-18: 1 via ORAL
  Filled 2023-07-18: qty 1

## 2023-07-18 MED ORDER — DOXYCYCLINE HYCLATE 100 MG PO CAPS: 100.0000 mg | ORAL_CAPSULE | Freq: Two times a day (BID) | ORAL | 0 refills | Status: AC

## 2023-07-18 MED ORDER — DOXYCYCLINE HYCLATE 100 MG PO TABS
100.0000 mg | ORAL_TABLET | Freq: Once | ORAL | Status: AC
  Administered 2023-07-18: 100 mg via ORAL
  Filled 2023-07-18: qty 1

## 2023-07-18 MED ORDER — KETOROLAC TROMETHAMINE 60 MG/2ML IM SOLN
30.0000 mg | Freq: Once | INTRAMUSCULAR | Status: AC
  Administered 2023-07-18: 30 mg via INTRAMUSCULAR
  Filled 2023-07-18: qty 2

## 2023-07-18 NOTE — Discharge Instructions (Addendum)
Ultrasound was negative for a blood clot, your labs were overall reassuring, you had a mildly elevated white blood cell count and in the setting of your ulceration of your foot, we will discharge you on a course of antibiotics.  Recommend you follow-up outpatient with your orthopedist in addition to your primary care provider, return for any worsening symptoms.  XR: IMPRESSION:  1. Soft tissue ulceration along the plantar surface of the forefoot  at the level of the metatarsal heads.  2. No signs of osteomyelitis.  3. Status post first ray amputation at the level of the MTP joint.

## 2023-07-18 NOTE — ED Provider Notes (Signed)
[place] [facility] AT [facility] Provider Note   CSN: [id number] Arrival date & time: 07/18/23  0747     History  Chief Complaint  Patient presents with   Leg Pain    Douglas Murphy is a 57 y.o. male.   Leg Pain    57 year old male with medical history significant for DM2, osteomyelitis status post amputation of the left great toe, Bell's palsy, hypertension, PVD who presents to the emergency department with pain in his left lower extremity that occurs in his mid calf and radiates up to his mid left thigh.  He denies any fevers, chills, redness or swelling but continues to endorse crampy pain posteriorly.  No history of blood clot.  Pain came on last night.  He is worried about infection.  No trauma to the leg.  Home Medications Prior to Admission medications   Medication Sig Start Date End Date Taking? Authorizing Provider  doxycycline (VIBRAMYCIN) 100 MG capsule Take 1 capsule (100 mg total) by mouth 2 (two) times daily for 5 days. 07/18/23 07/23/23 Yes Ernie Avena, MD  insulin NPH-regular Human (NOVOLIN 70/30) (70-30) 100 UNIT/ML injection Inject 30 Units into the skin 2 (two) times daily.    [provider]  metFORMIN (GLUCOPHAGE) 1000 MG tablet Take 1,000 mg by mouth 2 (two) times daily.    [provider]  simvastatin (ZOCOR) 10 MG tablet Take 10 mg by mouth every evening.    [provider]      Allergies    Other    Review of Systems   Review of Systems  Musculoskeletal:  Positive for myalgias.  All other systems reviewed and are negative.   Physical Exam Updated Vital Signs BP (!) 146/110 (BP Location: Right Arm)   Pulse 93   Temp 98.3 F (36.8 C) (Oral)   Resp 17   Ht 6' (1.829 m)   Wt 90.7 kg   SpO2 100%   BMI 27.12 kg/m  Physical Exam Vitals and nursing note reviewed.  Constitutional:      General: He is not in acute distress. HENT:     Head: Normocephalic and atraumatic.  Eyes:      Conjunctiva/sclera: Conjunctivae normal.     Pupils: Pupils are equal, round, and reactive to light.  Cardiovascular:     Rate and Rhythm: Normal rate and regular rhythm.  Pulmonary:     Effort: Pulmonary effort is normal. No respiratory distress.  Abdominal:     General: There is no distension.     Tenderness: There is no guarding.  Musculoskeletal:        General: No deformity or signs of injury.     Cervical back: Neck supple.     Comments: Left lower extremity status post amputation of the first digit, 2+ DP pulses, no tenderness about the ankle, posterior calf and thigh tenderness with no evidence of erythema, no edema.  Compartments soft and extremity is cool  Skin:    Findings: No lesion or rash.  Neurological:     General: No focal deficit present.     Mental Status: He is alert. Mental status is at baseline.      ED Results / Procedures / Treatments   Labs (all labs ordered are listed, but only abnormal results are displayed) Labs Reviewed  CBC WITH DIFFERENTIAL/PLATELET - Abnormal; Notable for the following components:      Result Value   WBC 10.9 (*)    Hemoglobin 12.4 (*)  Eosinophils Absolute 0.6 (*)    All other components within normal limits  BASIC METABOLIC PANEL - Abnormal; Notable for the following components:   Glucose, Bld 159 (*)    All other components within normal limits  CK    EKG None  Radiology DG Foot Complete Left  Result Date: 07/18/2023 CLINICAL DATA:  Foot ulcer. Complains of pain in left lower extremity. EXAM: LEFT FOOT - COMPLETE 3+ VIEW COMPARISON:  MRI 02/12/2023. FINDINGS: Status post first ray amputation at the level of the MTP joint. Soft tissue ulceration is identified along the plantar surface of the forefoot at the level of the metatarsal heads. Surrounding soft tissue swelling is identified. No signs of acute fracture or dislocation. No focal bone erosion of osteomyelitis identified. Plantar heel spur. IMPRESSION: 1. Soft  tissue ulceration along the plantar surface of the forefoot at the level of the metatarsal heads. 2. No signs of osteomyelitis. 3. Status post first ray amputation at the level of the MTP joint. Electronically Signed   By: Signa Kell M.D.   On: 07/18/2023 15:55   VAS [place] LOWER EXTREMITY VENOUS (DVT) (ONLY [facility] & [facility])  Result Date: 07/18/2023  Lower Venous DVT Study Patient Name:  Douglas Murphy [facility]  Date of Exam:   07/18/2023 Medical Rec #: [id number]             Accession #:    [id number] Date of Birth: 02/11/1966              Patient Gender: M Patient Age:   32 years Exam Location:  [facility] Procedure:      VAS [place] LOWER EXTREMITY VENOUS (DVT) Referring Phys: Ernie Avena --------------------------------------------------------------------------------  Indications: Pain.  Risk Factors: None identified. Comparison Study: No prior studies. Performing Technologist: Chanda Busing RVT  Examination Guidelines: A complete evaluation includes B-mode imaging, spectral Doppler, color Doppler, and power Doppler as needed of all accessible portions of each vessel. Bilateral testing is considered an integral part of a complete examination. Limited examinations for reoccurring indications may be performed as noted. The reflux portion of the exam is performed with the patient in reverse Trendelenburg.  +-----+---------------+---------+-----------+----------+--------------+ RIGHTCompressibilityPhasicitySpontaneityPropertiesThrombus Aging +-----+---------------+---------+-----------+----------+--------------+ CFV  Full           Yes      Yes                                 +-----+---------------+---------+-----------+----------+--------------+   +---------+---------------+---------+-----------+----------+--------------+ LEFT     CompressibilityPhasicitySpontaneityPropertiesThrombus Aging +---------+---------------+---------+-----------+----------+--------------+ CFV      Full           Yes       Yes                                 +---------+---------------+---------+-----------+----------+--------------+ SFJ      Full                                                        +---------+---------------+---------+-----------+----------+--------------+ FV Prox  Full                                                        +---------+---------------+---------+-----------+----------+--------------+  FV Mid   Full                                                        +---------+---------------+---------+-----------+----------+--------------+ FV DistalFull                                                        +---------+---------------+---------+-----------+----------+--------------+ PFV      Full                                                        +---------+---------------+---------+-----------+----------+--------------+ POP      Full           Yes      Yes                                 +---------+---------------+---------+-----------+----------+--------------+ PTV      Full                                                        +---------+---------------+---------+-----------+----------+--------------+ PERO     Full                                                        +---------+---------------+---------+-----------+----------+--------------+     Summary: RIGHT: - No evidence of common femoral vein obstruction.   LEFT: - There is no evidence of deep vein thrombosis in the lower extremity.  - No cystic structure found in the popliteal fossa.  *See table(s) above for measurements and observations. Electronically signed by Sherald Hess MD on 07/18/2023 at 12:09:55 PM.    Final     Procedures Procedures    Medications Ordered in ED Medications  HYDROcodone-acetaminophen (NORCO/VICODIN) 5-325 MG per tablet 1 tablet (1 tablet Oral Given 07/18/23 0912)  doxycycline (VIBRA-TABS) tablet 100 mg (100 mg Oral Given 07/18/23 1246)  ketorolac  (TORADOL) injection 30 mg (30 mg Intramuscular Given 07/18/23 1427)    ED Course/ Medical Decision Making/ A&P                                 Medical Decision Making Amount and/or Complexity of Data Reviewed Labs: ordered. Radiology: ordered.  Risk Prescription drug management.    57 year old male with medical history significant for DM2, osteomyelitis status post amputation of the left great toe, Bell's palsy, hypertension, PVD who presents to the emergency department with pain in his left lower extremity that occurs in his mid calf and radiates up to his mid left thigh.  He denies any fevers, chills, redness or swelling  but continues to endorse crampy pain posteriorly.  No history of blood clot.  Pain came on last night.  He is worried about infection.  No trauma to the leg.  On arrival, the patient was afebrile, initially tachycardic heart rate 104, heart rate improved to 81 without intervention, hemodynamically stable, saturating well on room air.  Physical exam revealed evidence of an ulceration to the left foot, history of osteomyelitis in the foot, however patient endorsing pain throughout his left leg, compartments are soft on arrival, no evidence of cellulitis, extremities were cool to the touch, no erythema noted, distal pulses were intact.  Considered DVT, DVT ultrasound was performed and ultimately resulted negative for DVT.   XR of the left foot was obtained: IMPRESSION:  1. Soft tissue ulceration along the plantar surface of the forefoot  at the level of the metatarsal heads.  2. No signs of osteomyelitis.  3. Status post first ray amputation at the level of the MTP joint.   Labs revealed a nonspecific leukocytosis to 10.9, mild anemia 12.4, BMP generally unremarkable, mild hyperglycemia to 159, CK was normal.  Patient is overall well-appearing, symptomatically improved following Toradol, will discharge with a course of doxycycline given his foot ulceration, advised that he  follow-up outpatient with his orthopedist, return precautions provided.   Final Clinical Impression(s) / ED Diagnoses Final diagnoses:  Plantar ulcer of left foot, unspecified ulcer stage (HCC)  Pain of left lower extremity    Rx / DC Orders ED Discharge Orders          Ordered    doxycycline (VIBRAMYCIN) 100 MG capsule  2 times daily        07/18/23 1523              Ernie Avena, MD 07/18/23 1610

## 2023-07-18 NOTE — Progress Notes (Signed)
Left lower extremity venous duplex has been completed. Preliminary results can be found in CV Proc through chart review.  Results were given to Dr. Karene Fry.  07/18/23 11:03 AM Olen Cordial RVT

## 2023-07-18 NOTE — ED Triage Notes (Signed)
Patient arrives for pain to LLE from medial mid thigh down to mid calf. Concerned d/t previous hx of toe amputation that he has an infection in his leg. No redness or swelling noted. Denies injury.

## 2023-07-26 ENCOUNTER — Ambulatory Visit (INDEPENDENT_AMBULATORY_CARE_PROVIDER_SITE_OTHER): Payer: 59 | Admitting: Orthopedic Surgery

## 2023-07-26 DIAGNOSIS — L97511 Non-pressure chronic ulcer of other part of right foot limited to breakdown of skin: Secondary | ICD-10-CM

## 2023-07-26 DIAGNOSIS — Z89421 Acquired absence of other right toe(s): Secondary | ICD-10-CM | POA: Diagnosis not present

## 2023-07-29 ENCOUNTER — Encounter: Payer: Self-pay | Admitting: Orthopedic Surgery
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
## 2023-08-29 ENCOUNTER — Ambulatory Visit: Payer: 59 | Admitting: Orthopedic Surgery

## 2023-08-29 DIAGNOSIS — Z89421 Acquired absence of other right toe(s): Secondary | ICD-10-CM | POA: Diagnosis not present

## 2023-08-29 DIAGNOSIS — L97521 Non-pressure chronic ulcer of other part of left foot limited to breakdown of skin: Secondary | ICD-10-CM

## 2023-08-29 DIAGNOSIS — L97511 Non-pressure chronic ulcer of other part of right foot limited to breakdown of skin: Secondary | ICD-10-CM | POA: Diagnosis not present

## 2023-09-04 ENCOUNTER — Encounter: Payer: Self-pay | Admitting: Orthopedic Surgery
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
## 2023-09-26 ENCOUNTER — Ambulatory Visit (INDEPENDENT_AMBULATORY_CARE_PROVIDER_SITE_OTHER): Payer: 59 | Admitting: Orthopedic Surgery

## 2023-09-26 ENCOUNTER — Encounter: Payer: Self-pay | Admitting: Orthopedic Surgery

## 2023-09-26 DIAGNOSIS — L97521 Non-pressure chronic ulcer of other part of left foot limited to breakdown of skin: Secondary | ICD-10-CM | POA: Diagnosis not present

## 2023-09-26 DIAGNOSIS — L97511 Non-pressure chronic ulcer of other part of right foot limited to breakdown of skin: Secondary | ICD-10-CM | POA: Diagnosis not present

## 2023-09-26 DIAGNOSIS — Z89421 Acquired absence of other right toe(s): Secondary | ICD-10-CM
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
## 2023-10-24 ENCOUNTER — Ambulatory Visit (INDEPENDENT_AMBULATORY_CARE_PROVIDER_SITE_OTHER): Payer: 59 | Admitting: Orthopedic Surgery

## 2023-10-24 DIAGNOSIS — Z89421 Acquired absence of other right toe(s): Secondary | ICD-10-CM

## 2023-10-24 DIAGNOSIS — L97521 Non-pressure chronic ulcer of other part of left foot limited to breakdown of skin: Secondary | ICD-10-CM

## 2023-10-31 ENCOUNTER — Encounter: Payer: Self-pay | Admitting: Orthopedic Surgery

## 2023-10-31 ENCOUNTER — Ambulatory Visit (INDEPENDENT_AMBULATORY_CARE_PROVIDER_SITE_OTHER): Payer: 59 | Admitting: Orthopedic Surgery

## 2023-10-31 DIAGNOSIS — L97521 Non-pressure chronic ulcer of other part of left foot limited to breakdown of skin: Secondary | ICD-10-CM
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
## 2023-11-02 ENCOUNTER — Encounter: Payer: Self-pay | Admitting: Orthopedic Surgery
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
## 2023-11-08 ENCOUNTER — Encounter: Payer: Self-pay | Admitting: Orthopedic Surgery

## 2023-11-08 ENCOUNTER — Ambulatory Visit (INDEPENDENT_AMBULATORY_CARE_PROVIDER_SITE_OTHER): Payer: 59 | Admitting: Orthopedic Surgery

## 2023-11-08 DIAGNOSIS — L97521 Non-pressure chronic ulcer of other part of left foot limited to breakdown of skin: Secondary | ICD-10-CM
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
## 2024-05-31 ENCOUNTER — Ambulatory Visit: Admitting: Physician Assistant

## 2024-07-16 ENCOUNTER — Encounter: Payer: Self-pay | Admitting: Radiology

## 2024-07-24 ENCOUNTER — Ambulatory Visit: Admitting: Physician Assistant

## 2024-07-24 DIAGNOSIS — Z89412 Acquired absence of left great toe: Secondary | ICD-10-CM | POA: Diagnosis not present

## 2024-07-24 DIAGNOSIS — L97521 Non-pressure chronic ulcer of other part of left foot limited to breakdown of skin: Secondary | ICD-10-CM
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
## 2024-07-25 ENCOUNTER — Encounter: Payer: Self-pay | Admitting: Physician Assistant

## 2024-09-19 ENCOUNTER — Ambulatory Visit: Admitting: Physician Assistant

## 2024-10-10 ENCOUNTER — Other Ambulatory Visit: Payer: Self-pay

## 2024-10-10 ENCOUNTER — Emergency Department (HOSPITAL_COMMUNITY)

## 2024-10-10 ENCOUNTER — Emergency Department (HOSPITAL_COMMUNITY)
Admission: EM | Admit: 2024-10-10 | Discharge: 2024-10-11 | Discharge status: Against Medical Advice | Attending: Emergency Medicine | Admitting: Emergency Medicine

## 2024-10-10 DIAGNOSIS — M79605 Pain in left leg: Secondary | ICD-10-CM | POA: Insufficient documentation

## 2024-10-10 DIAGNOSIS — Z5321 Procedure and treatment not carried out due to patient leaving prior to being seen by health care provider: Secondary | ICD-10-CM | POA: Insufficient documentation

## 2024-10-10 LAB — COMPREHENSIVE METABOLIC PANEL WITH GFR
ALT: 15 U/L (ref 0–44)
AST: 21 U/L (ref 15–41)
Albumin: 4.5 g/dL (ref 3.5–5.0)
Alkaline Phosphatase: 132 U/L — ABNORMAL HIGH (ref 38–126)
Anion gap: 18 — ABNORMAL HIGH (ref 5–15)
BUN: 27 mg/dL — ABNORMAL HIGH (ref 6–20)
CO2: 24 mmol/L (ref 22–32)
Calcium: 10 mg/dL (ref 8.9–10.3)
Chloride: 91 mmol/L — ABNORMAL LOW (ref 98–111)
Creatinine, Ser: 1.29 mg/dL — ABNORMAL HIGH (ref 0.61–1.24)
GFR, Estimated: >60 mL/min
Glucose, Bld: 339 mg/dL — ABNORMAL HIGH (ref 70–99)
Potassium: 4.6 mmol/L (ref 3.5–5.1)
Sodium: 133 mmol/L — ABNORMAL LOW (ref 135–145)
Total Bilirubin: 0.4 mg/dL (ref 0.0–1.2)
Total Protein: 8.6 g/dL — ABNORMAL HIGH (ref 6.5–8.1)

## 2024-10-10 LAB — CBC WITH DIFFERENTIAL/PLATELET
Abs Immature Granulocytes: 0.12 10*3/uL — ABNORMAL HIGH (ref 0.00–0.07)
Basophils Absolute: 0.1 10*3/uL (ref 0.0–0.1)
Basophils Relative: 0 %
Eosinophils Absolute: 0.2 10*3/uL (ref 0.0–0.5)
Eosinophils Relative: 1 %
HCT: 39 % (ref 39.0–52.0)
Hemoglobin: 13.1 g/dL (ref 13.0–17.0)
Immature Granulocytes: 1 %
Lymphocytes Relative: 10 %
Lymphs Abs: 1.7 10*3/uL (ref 0.7–4.0)
MCH: 29.4 pg (ref 26.0–34.0)
MCHC: 33.6 g/dL (ref 30.0–36.0)
MCV: 87.4 fL (ref 80.0–100.0)
Monocytes Absolute: 1.3 10*3/uL — ABNORMAL HIGH (ref 0.1–1.0)
Monocytes Relative: 7 %
Neutro Abs: 14.3 10*3/uL — ABNORMAL HIGH (ref 1.7–7.7)
Neutrophils Relative %: 81 %
Platelets: 309 10*3/uL (ref 150–400)
RBC: 4.46 MIL/uL (ref 4.22–5.81)
RDW: 12.7 % (ref 11.5–15.5)
WBC: 17.6 10*3/uL — ABNORMAL HIGH (ref 4.0–10.5)
nRBC: 0 % (ref 0.0–0.2)

## 2024-10-10 LAB — I-STAT CG4 LACTIC ACID, ED: Lactic Acid, Venous: 1.3 mmol/L (ref 0.5–1.9)

## 2024-10-10 NOTE — ED Provider Triage Note (Signed)
 Emergency Medicine Provider Triage Evaluation Note  Douglas Murphy , a 59 y.o. male  was evaluated in triage.  Pt complains of leg pain.  Reportedly has had worsening pain with some warmth and questionable swelling to this area.  He had amputation performed in 2018 by Dr. Harden.  States that the area has developed what appears to be a blister towards the pad of the left great toe. States the leg feels warm but no reported fevers. Has not been on abxs recently.  Review of Systems  Positive: As above Negative: As above  Physical Exam  BP (!) 141/88   Pulse (!) 114   Temp 98 F (36.7 C)   Resp 16   SpO2 100%  Gen:   Awake, no distress   Resp:  Normal effort  MSK:   Moves extremities without difficulty  Other:  TTP along the posterior portion of the left leg with no obvious swelling compared to right. The left leg is tender and warm to palpation. No drainage present. Blister present to the pad of the left foot great toe.  Medical Decision Making  Medically screening exam initiated at 5:46 PM.  Appropriate orders placed.  Elsie Redell Cunning was informed that the remainder of the evaluation will be completed by another provider, this initial triage assessment does not replace that evaluation, and the importance of remaining in the ED until their evaluation is complete.     Keller Bounds A, PA-C 10/10/24 1749

## 2024-10-10 NOTE — ED Triage Notes (Signed)
 Pt with left leg pain, worse with palpation. No fevers.  States he had a toe amputated on that foot in 2018

## 2024-10-10 NOTE — ED Notes (Addendum)
 Pt has a wound on his left foot with redness up his leg that he states has been there for a while but got worse yesterday. Pt is being followed by Dr. Harden for the wound but says it has gotten worse since the last time he saw him. States he has not been taking his medications at home

## 2024-10-11 ENCOUNTER — Inpatient Hospital Stay (HOSPITAL_COMMUNITY)
Admission: EM | Admit: 2024-10-11 | Discharge: 2024-10-15 | DRG: 638 | Disposition: A | Attending: Family Medicine | Admitting: Family Medicine

## 2024-10-11 ENCOUNTER — Inpatient Hospital Stay (HOSPITAL_COMMUNITY)

## 2024-10-11 ENCOUNTER — Encounter (HOSPITAL_COMMUNITY): Payer: Self-pay

## 2024-10-11 DIAGNOSIS — E11628 Type 2 diabetes mellitus with other skin complications: Principal | ICD-10-CM | POA: Diagnosis present

## 2024-10-11 DIAGNOSIS — E1169 Type 2 diabetes mellitus with other specified complication: Secondary | ICD-10-CM

## 2024-10-11 DIAGNOSIS — I10 Essential (primary) hypertension: Secondary | ICD-10-CM | POA: Diagnosis present

## 2024-10-11 DIAGNOSIS — E785 Hyperlipidemia, unspecified: Secondary | ICD-10-CM | POA: Diagnosis present

## 2024-10-11 DIAGNOSIS — Z91018 Allergy to other foods: Secondary | ICD-10-CM

## 2024-10-11 DIAGNOSIS — D649 Anemia, unspecified: Secondary | ICD-10-CM | POA: Diagnosis not present

## 2024-10-11 DIAGNOSIS — L02612 Cutaneous abscess of left foot: Secondary | ICD-10-CM | POA: Diagnosis not present

## 2024-10-11 DIAGNOSIS — I891 Lymphangitis: Secondary | ICD-10-CM | POA: Insufficient documentation

## 2024-10-11 DIAGNOSIS — Z89421 Acquired absence of other right toe(s): Secondary | ICD-10-CM

## 2024-10-11 DIAGNOSIS — E11621 Type 2 diabetes mellitus with foot ulcer: Secondary | ICD-10-CM | POA: Diagnosis present

## 2024-10-11 DIAGNOSIS — Z794 Long term (current) use of insulin: Secondary | ICD-10-CM

## 2024-10-11 DIAGNOSIS — L089 Local infection of the skin and subcutaneous tissue, unspecified: Secondary | ICD-10-CM | POA: Diagnosis not present

## 2024-10-11 DIAGNOSIS — G629 Polyneuropathy, unspecified: Secondary | ICD-10-CM

## 2024-10-11 DIAGNOSIS — E1165 Type 2 diabetes mellitus with hyperglycemia: Secondary | ICD-10-CM | POA: Diagnosis present

## 2024-10-11 DIAGNOSIS — E119 Type 2 diabetes mellitus without complications: Secondary | ICD-10-CM

## 2024-10-11 DIAGNOSIS — L03116 Cellulitis of left lower limb: Secondary | ICD-10-CM | POA: Diagnosis not present

## 2024-10-11 DIAGNOSIS — L97529 Non-pressure chronic ulcer of other part of left foot with unspecified severity: Secondary | ICD-10-CM | POA: Diagnosis present

## 2024-10-11 DIAGNOSIS — Z8249 Family history of ischemic heart disease and other diseases of the circulatory system: Secondary | ICD-10-CM

## 2024-10-11 DIAGNOSIS — Z7984 Long term (current) use of oral hypoglycemic drugs: Secondary | ICD-10-CM

## 2024-10-11 DIAGNOSIS — E1151 Type 2 diabetes mellitus with diabetic peripheral angiopathy without gangrene: Secondary | ICD-10-CM | POA: Diagnosis present

## 2024-10-11 DIAGNOSIS — E1140 Type 2 diabetes mellitus with diabetic neuropathy, unspecified: Secondary | ICD-10-CM | POA: Diagnosis present

## 2024-10-11 DIAGNOSIS — B954 Other streptococcus as the cause of diseases classified elsewhere: Secondary | ICD-10-CM | POA: Diagnosis present

## 2024-10-11 DIAGNOSIS — Z89422 Acquired absence of other left toe(s): Secondary | ICD-10-CM

## 2024-10-11 LAB — CBC WITH DIFFERENTIAL/PLATELET
Abs Immature Granulocytes: 0.08 10*3/uL — ABNORMAL HIGH (ref 0.00–0.07)
Basophils Absolute: 0 10*3/uL (ref 0.0–0.1)
Basophils Relative: 0 %
Eosinophils Absolute: 0.4 10*3/uL (ref 0.0–0.5)
Eosinophils Relative: 3 %
HCT: 35.2 % — ABNORMAL LOW (ref 39.0–52.0)
Hemoglobin: 11.6 g/dL — ABNORMAL LOW (ref 13.0–17.0)
Immature Granulocytes: 1 %
Lymphocytes Relative: 12 %
Lymphs Abs: 1.6 10*3/uL (ref 0.7–4.0)
MCH: 29.2 pg (ref 26.0–34.0)
MCHC: 33 g/dL (ref 30.0–36.0)
MCV: 88.7 fL (ref 80.0–100.0)
Monocytes Absolute: 1.2 10*3/uL — ABNORMAL HIGH (ref 0.1–1.0)
Monocytes Relative: 8 %
Neutro Abs: 10.6 10*3/uL — ABNORMAL HIGH (ref 1.7–7.7)
Neutrophils Relative %: 76 %
Platelets: 272 10*3/uL (ref 150–400)
RBC: 3.97 MIL/uL — ABNORMAL LOW (ref 4.22–5.81)
RDW: 12.6 % (ref 11.5–15.5)
WBC: 13.9 10*3/uL — ABNORMAL HIGH (ref 4.0–10.5)
nRBC: 0 % (ref 0.0–0.2)

## 2024-10-11 LAB — COMPREHENSIVE METABOLIC PANEL WITH GFR
ALT: 13 U/L (ref 0–44)
AST: 19 U/L (ref 15–41)
Albumin: 3.9 g/dL (ref 3.5–5.0)
Alkaline Phosphatase: 110 U/L (ref 38–126)
Anion gap: 12 (ref 5–15)
BUN: 26 mg/dL — ABNORMAL HIGH (ref 6–20)
CO2: 25 mmol/L (ref 22–32)
Calcium: 9.2 mg/dL (ref 8.9–10.3)
Chloride: 94 mmol/L — ABNORMAL LOW (ref 98–111)
Creatinine, Ser: 1.15 mg/dL (ref 0.61–1.24)
GFR, Estimated: >60 mL/min
Glucose, Bld: 472 mg/dL — ABNORMAL HIGH (ref 70–99)
Potassium: 4.6 mmol/L (ref 3.5–5.1)
Sodium: 130 mmol/L — ABNORMAL LOW (ref 135–145)
Total Bilirubin: 0.6 mg/dL (ref 0.0–1.2)
Total Protein: 7.3 g/dL (ref 6.5–8.1)

## 2024-10-11 LAB — GLUCOSE, CAPILLARY
Glucose-Capillary: 263 mg/dL — ABNORMAL HIGH (ref 70–99)
Glucose-Capillary: 271 mg/dL — ABNORMAL HIGH (ref 70–99)

## 2024-10-11 LAB — CBG MONITORING, ED: Glucose-Capillary: 314 mg/dL — ABNORMAL HIGH (ref 70–99)

## 2024-10-11 LAB — HEMOGLOBIN A1C
Hgb A1c MFr Bld: 11 % — ABNORMAL HIGH (ref 4.8–5.6)
Mean Plasma Glucose: 269 mg/dL

## 2024-10-11 LAB — I-STAT CG4 LACTIC ACID, ED: Lactic Acid, Venous: 1.2 mmol/L (ref 0.5–1.9)

## 2024-10-11 MED ORDER — LIDOCAINE-EPINEPHRINE 1 %-1:100000 IJ SOLN
4.0000 mL | INTRAMUSCULAR | Status: DC
  Administered 2024-10-11: 4 mL

## 2024-10-11 MED ORDER — SIMVASTATIN 20 MG PO TABS
10.0000 mg | ORAL_TABLET | Freq: Every evening | ORAL | Status: DC
  Administered 2024-10-11 – 2024-10-14 (×4): 10 mg via ORAL
  Filled 2024-10-11 (×4): qty 1

## 2024-10-11 MED ORDER — INSULIN ASPART PROT & ASPART (70-30 MIX) 100 UNIT/ML ~~LOC~~ SUSP
20.0000 [IU] | Freq: Two times a day (BID) | SUBCUTANEOUS | Status: DC
  Administered 2024-10-11 – 2024-10-15 (×8): 20 [IU] via SUBCUTANEOUS
  Filled 2024-10-11: qty 10

## 2024-10-11 MED ORDER — INSULIN ASPART 100 UNIT/ML IJ SOLN: 0.0000 [IU] | Freq: Three times a day (TID) | INTRAMUSCULAR | Status: DC

## 2024-10-11 MED ORDER — HYDROCODONE-ACETAMINOPHEN 5-325 MG PO TABS: 1.0000 | ORAL_TABLET | Freq: Four times a day (QID) | ORAL | Status: DC | PRN

## 2024-10-11 MED ORDER — POLYETHYLENE GLYCOL 3350 17 G PO PACK: 17.0000 g | PACK | Freq: Every day | ORAL | Status: DC | PRN

## 2024-10-11 MED ORDER — VANCOMYCIN HCL 750 MG/150ML IV SOLN
750.0000 mg | Freq: Three times a day (TID) | INTRAVENOUS | Status: DC
  Administered 2024-10-12 – 2024-10-14 (×5): 750 mg via INTRAVENOUS
  Filled 2024-10-11 (×6): qty 150

## 2024-10-11 MED ORDER — SODIUM CHLORIDE 0.9 % IV SOLN
1.0000 g | INTRAVENOUS | Status: DC
  Administered 2024-10-12 – 2024-10-13 (×2): 1 g via INTRAVENOUS
  Filled 2024-10-11 (×2): qty 10

## 2024-10-11 MED ORDER — ACETAMINOPHEN 325 MG PO TABS
650.0000 mg | ORAL_TABLET | Freq: Four times a day (QID) | ORAL | Status: DC | PRN
  Administered 2024-10-12: 650 mg via ORAL
  Filled 2024-10-11: qty 2

## 2024-10-11 MED ORDER — INSULIN ASPART 100 UNIT/ML IJ SOLN
0.0000 [IU] | Freq: Three times a day (TID) | INTRAMUSCULAR | Status: DC
  Administered 2024-10-11: 8 [IU] via SUBCUTANEOUS
  Administered 2024-10-12: 2 [IU] via SUBCUTANEOUS
  Administered 2024-10-12: 5 [IU] via SUBCUTANEOUS
  Administered 2024-10-12: 2 [IU] via SUBCUTANEOUS
  Administered 2024-10-13: 5 [IU] via SUBCUTANEOUS
  Administered 2024-10-13: 8 [IU] via SUBCUTANEOUS
  Administered 2024-10-13: 2 [IU] via SUBCUTANEOUS
  Administered 2024-10-14: 5 [IU] via SUBCUTANEOUS
  Administered 2024-10-14 – 2024-10-15 (×3): 3 [IU] via SUBCUTANEOUS
  Administered 2024-10-15: 5 [IU] via SUBCUTANEOUS
  Filled 2024-10-11: qty 8
  Filled 2024-10-11 (×2): qty 5
  Filled 2024-10-11 (×2): qty 3
  Filled 2024-10-11: qty 2
  Filled 2024-10-11: qty 8
  Filled 2024-10-11 (×2): qty 2
  Filled 2024-10-11 (×2): qty 5
  Filled 2024-10-11: qty 3

## 2024-10-11 MED ORDER — SODIUM CHLORIDE 0.9 % IV SOLN
1.0000 g | Freq: Once | INTRAVENOUS | Status: AC
  Administered 2024-10-11: 1 g via INTRAVENOUS
  Filled 2024-10-11: qty 10

## 2024-10-11 MED ORDER — SODIUM CHLORIDE 0.9% FLUSH
3.0000 mL | Freq: Two times a day (BID) | INTRAVENOUS | Status: DC
  Administered 2024-10-11 – 2024-10-15 (×6): 3 mL via INTRAVENOUS

## 2024-10-11 MED ORDER — INSULIN ASPART 100 UNIT/ML IJ SOLN: 6.0000 [IU] | Freq: Once | INTRAMUSCULAR | Status: DC

## 2024-10-11 MED ORDER — LIDOCAINE-EPINEPHRINE (PF) 2 %-1:200000 IJ SOLN
10.0000 mL | Freq: Once | INTRAMUSCULAR | Status: AC
  Administered 2024-10-11: 10 mL
  Filled 2024-10-11: qty 20

## 2024-10-11 MED ORDER — VANCOMYCIN HCL 1750 MG/350ML IV SOLN
1750.0000 mg | Freq: Once | INTRAVENOUS | Status: AC
  Administered 2024-10-11: 1750 mg via INTRAVENOUS
  Filled 2024-10-11: qty 350

## 2024-10-11 MED ORDER — INSULIN ASPART 100 UNIT/ML IJ SOLN
0.0000 [IU] | Freq: Every day | INTRAMUSCULAR | Status: DC
  Administered 2024-10-11: 3 [IU] via SUBCUTANEOUS
  Filled 2024-10-11: qty 3

## 2024-10-11 MED ORDER — ACETAMINOPHEN 650 MG RE SUPP: 650.0000 mg | Freq: Four times a day (QID) | RECTAL | Status: DC | PRN

## 2024-10-11 NOTE — Progress Notes (Addendum)
 Pharmacy Antibiotic Note  Douglas Murphy is a 59 y.o. male admitted on 10/11/2024 with cellulitis.  Pt had L great toe amputated in 2018. Pharmacy has been consulted for vancomycin  dosing.  Plan: Vancomycin  1750mg  IV x1 Vancomycin  750 mg IV every 8 hours.  (eAUC 494, Scr 1.15, Vd 0.72) Follow up renal function for adjustments as needed  Height: 6' (182.9 cm) Weight: 90.7 kg (200 lb) IBW/kg (Calculated) : 77.6  Temp (24hrs), Avg:98 F (36.7 C), Min:97.5 F (36.4 C), Max:98.6 F (37 C)  Recent Labs  Lab 10/10/24 1831 10/10/24 1843 10/11/24 0756 10/11/24 0806  WBC  --  17.6* 13.9*  --   CREATININE  --  1.29* 1.15  --   LATICACIDVEN 1.3  --   --  1.2    Estimated Creatinine Clearance: 78.7 mL/min (by C-G formula based on SCr of 1.15 mg/dL).    Allergies[1]   Microbiology results: 1/29 BCx: pending  Thank you for allowing pharmacy to be a part of this patients care.  Elma Fail, PharmD PGY1 Clinical Pharmacist [facility]  10/11/2024 4:31 PM      [1]  Allergies Allergen Reactions   Other Anaphylaxis and Other (See Comments)    Brazilian nuts

## 2024-10-11 NOTE — ED Provider Notes (Signed)
 " [facility] Provider Note   CSN: [id number] Arrival date & time: 10/11/24  9264     Patient presents with: Wound Check and Leg Pain   Douglas Murphy is a 59 y.o. male.   Pt is a 59 yo male with pmhx significant for DM, prior osteomyelitis and HLD.  Pt has had prior osteomyelitis to the left foot and is s/p amputation of the left 5th ray by Dr. Harden in 2018.  He has a known callus for which he's been seeing Dr. Harden.  He was last seen on 11/11 and it was debrided.  Pt said he's been having increased pain and redness to left foot with some redness going up to his left knee.  He was seen in the ED yesterday, but LWBS because of the wait.  Xray yesterday did not show definite osteomyelitis. No fevers.  Pt has not taken his diabetic meds in over a week.       Prior to Admission medications  Medication Sig Start Date End Date Taking? Authorizing Provider  insulin  NPH-regular Human (NOVOLIN 70/30) (70-30) 100 UNIT/ML injection Inject 30 Units into the skin 2 (two) times daily.   Yes [provider]  metFORMIN  (GLUCOPHAGE ) 1000 MG tablet Take 1,000 mg by mouth 2 (two) times daily.   Yes [provider]  simvastatin  (ZOCOR ) 10 MG tablet Take 10 mg by mouth every evening.   Yes [provider]    Allergies: Other    Review of Systems  Musculoskeletal:        Left foot pain and redness  All other systems reviewed and are negative.   Updated Vital Signs BP (!) 144/93 (BP Location: Left Arm)   Pulse (!) 102   Temp 98.4 F (36.9 C) (Oral)   Resp 18   Ht 6' (1.829 m)   Wt 91 kg   SpO2 100%   BMI 27.21 kg/m   Physical Exam Vitals and nursing note reviewed.  Constitutional:      Appearance: Normal appearance.  HENT:     Head: Normocephalic and atraumatic.     Right Ear: External ear normal.     Left Ear: External ear normal.     Nose: Nose normal.     Mouth/Throat:     Mouth: Mucous membranes are moist.     Pharynx:  Oropharynx is clear.  Eyes:     Extraocular Movements: Extraocular movements intact.     Conjunctiva/sclera: Conjunctivae normal.     Pupils: Pupils are equal, round, and reactive to light.  Cardiovascular:     Rate and Rhythm: Normal rate and regular rhythm.     Pulses: Normal pulses.     Heart sounds: Normal heart sounds.  Pulmonary:     Effort: Pulmonary effort is normal.     Breath sounds: Normal breath sounds.  Abdominal:     General: Abdomen is flat. Bowel sounds are normal.     Palpations: Abdomen is soft.  Musculoskeletal:     Cervical back: Normal range of motion and neck supple.  Skin:    Capillary Refill: Capillary refill takes less than 2 seconds.     Comments: Left foot with abscess and cellulitis with lymphangitis up to left knee See pictures.  Neurological:     General: No focal deficit present.     Mental Status: He is alert and oriented to person, place, and time.  Psychiatric:        Mood and Affect:  Mood normal.        Behavior: Behavior normal.        (all labs ordered are listed, but only abnormal results are displayed) Labs Reviewed  COMPREHENSIVE METABOLIC PANEL WITH GFR - Abnormal; Notable for the following components:      Result Value   Sodium 130 (*)    Chloride 94 (*)    Glucose, Bld 472 (*)    BUN 26 (*)    All other components within normal limits  CBC WITH DIFFERENTIAL/PLATELET - Abnormal; Notable for the following components:   WBC 13.9 (*)    RBC 3.97 (*)    Hemoglobin 11.6 (*)    HCT 35.2 (*)    Neutro Abs 10.6 (*)    Monocytes Absolute 1.2 (*)    Abs Immature Granulocytes 0.08 (*)    All other components within normal limits  GLUCOSE, CAPILLARY - Abnormal; Notable for the following components:   Glucose-Capillary 263 (*)    All other components within normal limits  CBG MONITORING, ED - Abnormal; Notable for the following components:   Glucose-Capillary 314 (*)    All other components within normal limits  AEROBIC CULTURE W  GRAM STAIN (SUPERFICIAL SPECIMEN)  CULTURE, BLOOD (ROUTINE X 2)  CULTURE, BLOOD (ROUTINE X 2)  HIV ANTIBODY (ROUTINE TESTING W REFLEX)  HEMOGLOBIN A1C  COMPREHENSIVE METABOLIC PANEL WITH GFR  CBC  I-STAT CG4 LACTIC ACID, ED    EKG: None  Radiology: DG Foot Complete Left Result Date: 10/10/2024 CLINICAL DATA:  Concern for infection. EXAM: LEFT FOOT - COMPLETE 3+ VIEW COMPARISON:  Left foot radiograph dated 07/18/2023. FINDINGS: Amputation of the first digit. No acute fracture or dislocation. There is soft tissue swelling of the ball of the foot similar or slightly progressed since the prior radiograph. No radiopaque foreign object or soft tissue gas. IMPRESSION: 1. No acute fracture or dislocation. 2. Soft tissue swelling of the plantar aspect along the first metatarsal head. Electronically Signed   By: Vanetta Chou M.D.   On: 10/10/2024 18:58     .Incision and Drainage  Date/Time: 10/11/2024 5:39 PM  Performed by: Dean Clarity, MD Authorized by: Dean Clarity, MD   Consent:    Consent obtained:  Verbal   Consent given by:  Patient Universal protocol:    Patient identity confirmed:  Verbally with patient Location:    Type:  Abscess   Size:  5 Pre-procedure details:    Skin preparation:  Chlorhexidine  with alcohol Sedation:    Sedation type:  None Anesthesia:    Anesthesia method:  Local infiltration   Local anesthetic:  4 mL lidocaine -EPINEPHrine  1 %-1:100000 Procedure type:    Complexity:  Simple Procedure details:    Ultrasound guidance: no     Needle aspiration: no     Incision types:  Cruciate   Wound management:  Probed and deloculated   Drainage:  Purulent   Drainage amount:  Copious   Wound treatment:  Wound left open   Packing materials:  None Post-procedure details:    Procedure completion:  Tolerated well, no immediate complications    Medications Ordered in the ED  vancomycin  (VANCOREADY) IVPB 1750 mg/350 mL (1,750 mg Intravenous New Bag/Given  10/11/24 1725)  simvastatin  (ZOCOR ) tablet 10 mg (has no administration in time range)  cefTRIAXone  (ROCEPHIN ) 1 g in sodium chloride  0.9 % 100 mL IVPB (has no administration in time range)  sodium chloride  flush (NS) 0.9 % injection 3 mL (has no administration in time range)  acetaminophen  (TYLENOL ) tablet 650 mg (has no administration in time range)    Or  acetaminophen  (TYLENOL ) suppository 650 mg (has no administration in time range)  HYDROcodone -acetaminophen  (NORCO/VICODIN) 5-325 MG per tablet 1-2 tablet (has no administration in time range)  polyethylene glycol (MIRALAX  / GLYCOLAX ) packet 17 g (has no administration in time range)  insulin  aspart (novoLOG ) injection 0-15 Units (has no administration in time range)  insulin  aspart protamine- aspart (NOVOLOG  MIX 70/30) injection 20 Units (has no administration in time range)  vancomycin  (VANCOREADY) IVPB 750 mg/150 mL (has no administration in time range)  lidocaine -EPINEPHrine  (XYLOCAINE  W/EPI) 2 %-1:200000 (PF) injection 10 mL (10 mLs Infiltration Given by Other 10/11/24 1656)  cefTRIAXone  (ROCEPHIN ) 1 g in sodium chloride  0.9 % 100 mL IVPB (0 g Intravenous Stopped 10/11/24 1522)                                    Medical Decision Making Amount and/or Complexity of Data Reviewed Labs: ordered.  Risk Decision regarding hospitalization.   This patient presents to the ED for concern of infection, this involves an extensive number of treatment options, and is a complaint that carries with it a high risk of complications and morbidity.  The differential diagnosis includes osteomyelitis, diabetic foot abscess, cellulitis   Co morbidities that complicate the patient evaluation  DM, prior osteomyelitis and HLD   Additional history obtained:  Additional history obtained from epic chart review   Lab Tests:  I Ordered, and personally interpreted labs.  The pertinent results include:  cbc with wbc elevated at 13.9, hgb 11.6  (stable); cmp with glucose elevated at 472, lactic acid nl   Imaging Studies ordered:  I ordered imaging studies including mri foot  I independently visualized and interpreted imaging which showed pending upon admission I agree with the radiologist interpretation  Medicines ordered and prescription drug management:  I ordered medication including vanc and rocephin   for cellulitis/abscess  Reevaluation of the patient after these medicines showed that the patient improved I have reviewed the patients home medicines and have made adjustments as needed   Test Considered:  mri   Critical Interventions:  abx   Consultations Obtained:  I requested consultation with the orthopedist (Dr. Vernetta),  and discussed lab and imaging findings as well as pertinent plan - he recommended MRI and medicine admission Pt d/w Dr. Seena (triad) for admission.   Problem List / ED Course:  Abscess and cellulitis:  abscess drained with a lot of pus removed.  Pt started on rocephin /vanc.  Wound cx sent.  Pt likely has osteomyelitis, but MRI pending.  Pt adm. Poorly controlled DM:  pt given iv insulin    Reevaluation:  After the interventions noted above, I reevaluated the patient and found that they have :improved   Social Determinants of Health:  Lives at home   Dispostion:  After consideration of the diagnostic results and the patients response to treatment, I feel that the patent would benefit from admission.       Final diagnoses:  Cellulitis of left lower extremity  Poorly controlled diabetes mellitus (HCC)  Foot abscess, left    ED Discharge Orders     None          Dean Clarity, MD 10/11/24 1744  "

## 2024-10-11 NOTE — Inpatient Diabetes Management (Signed)
 Inpatient Diabetes Program Recommendations  AACE/ADA: New Consensus Statement on Inpatient Glycemic Control (2015)  Target Ranges:  Prepandial:   less than 140 mg/dL      Peak postprandial:   less than 180 mg/dL (1-2 hours)      Critically ill patients:  140 - 180 mg/dL   Lab Results  Component Value Date   GLUCAP 294 (H) 04/04/2023   HGBA1C 11.3 (H) 02/12/2023    Latest Reference Range & Units 10/10/24 18:43 10/11/24 07:56  Glucose 70 - 99 mg/dL 660 (H) 527 (H)  (H): Data is abnormally high  Diabetes history: DM2 Outpatient Diabetes medications:  70/30 30 units bid Metformin  1 gm bid Current orders for Inpatient glycemic control: None yet  Inpatient Diabetes Program Recommendations:   Spoke with Jenna Perkins regarding patient. Patient has just come to ED room from [facility] and no orders yet. Lab glucose 472.  Will follow while hospitalized.  Thank you, Regnald Bowens E. Jalen Oberry, RN, MSN, CNS, CDCES  Diabetes Coordinator Inpatient Glycemic Control Team Team Pager [phone] (8am-5pm) 10/11/2024 2:02 PM

## 2024-10-11 NOTE — Consult Note (Addendum)
" °  CLINICAL SUPPORT TEAM - WOUND OSTOMY AND CONTINENCE TEAM  CONSULTATION SERVICES   WOC Nurse-Inpatient Note  WOC team consulted for wound L plantar foot. This is a diabetic ulcer that is being followed by orthopedics.  Please see Dr. Damian note (1/29) where I&D was performed and dry dressing placed.   Will write for daily Vashe WTD dressing to L plantar foot with ongoing management by orthopedics.       WOC team will not follow Reconsult if further needs arise.   Thank you,    Kaetlin Bullen MSN, RN-BC, CWOCN  "
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
## 2024-10-11 NOTE — Progress Notes (Signed)
 ED Pharmacy Antibiotic Sign Off An antibiotic consult was received from an ED provider for Vancomycin  per pharmacy dosing for cellulitis. A chart review was completed to assess appropriateness.   The following one time order(s) were placed:  Vancomycin  1750mg  IV once  Further antibiotic and/or antibiotic pharmacy consults should be ordered by the admitting provider if indicated.   Thank you for allowing pharmacy to be a part of this patient's care.   Vito Ralph, PharmD, BCPS Please see amion for complete clinical pharmacist phone list 10/11/24 3:35 PM

## 2024-10-11 NOTE — ED Triage Notes (Addendum)
 Pt c/o L leg pain and worsening wound to L great toe area x3 days. Denies pain.  Pt had great toe amputated in 2018.    Denies fevers.   Pt was LWBS yesterday and had a L foot xray completed.

## 2024-10-11 NOTE — Consult Note (Signed)
"      Consultation Note  Date of consultation: 10/11/2024 Reason for consultation: Left diabetic foot wound/ulcer Consulting provider: Jolynn Pack, EDP  HPI: The patient is a 59 year old diabetic with poorly controlled diabetes who has had a previous history of wounds on both his feet.  He has had 5th toe amputation on the right side and a first ray resection on the left foot.  The left foot surgery was back in 2018.  He was last seen by my partner Dr. Harden back in February 2025.  The patient reports a recent history of a wound on his left foot under the first ray where his great toe was amputated.  It is slowly worsened over the last 3 days and he presented to the emergency room yesterday and today for further evaluation and treatment.  He does report that his blood glucose has been running high secondary to poor medical compliance.  Even today his blood glucose was running high.  He has been graciously admitted to the medicine service and started on IV antibiotics.  His peripheral white blood cell count is 13.9.  Yesterday it was 17.6 so it still has been high.  He denies any pain.  Exam: Examination of the left foot does show a wound on the plantar aspect under the first ray at the metatarsal head area.  I was able to unroofed the blister and drained this area.  It is still concerning for deeper infection.  There is no significant cellulitis.  His foot is warm.  He does have a palpable dorsalis pedis pulse.  X-rays: Plain films were reviewed of the left foot and it does not show any erosive changes of the bone along the first ray.  Assessment: Left diabetic foot ulcer with infection  Recommendations/plan: I certainly agree with the need for IV antibiotics and for a MRI of his left foot to evaluate for osteomyelitis and for further abscess.  I did place a new dry dressing over the wound and talked to the patient extensively about the potential need for further surgery on his foot.  He would like to  see how he does with antibiotics first and certainly that is reasonable since he is not septic.  My partner Dr. Harden is out of the office and I will for the next few days and is not back to the first of the week.  I will continue to follow the patient along closely and if urgent surgery is indicated we will proceed from there.  Obviously I will still get Dr. Harden involved when he is back in town. "

## 2024-10-11 NOTE — Progress Notes (Signed)
 NEW ADMISSION NOTE New Admission Note:   Arrival Method: Patient arrived from ED Mental Orientation: Alert and oriented x 4. Telemetry: 5M16, NSR Assessment: Completed Skin: Warm dry and intact, diabetic wound  IV: Left FA infusing Vancomycin , R FA SL Pain: Denies any pain currently. Tubes: N/A Safety Measures: Safety Fall Prevention Plan has been given, discussed and signed Admission: Completed [facility] Orientation: Patient has been orientated to the room, unit and staff.  Family: NOne at bedside  Orders have been reviewed and implemented. Will continue to monitor the patient. Call light has been placed within reach and bed alarm has been activated.   Franciso Bodily, RN

## 2024-10-12 ENCOUNTER — Other Ambulatory Visit (HOSPITAL_COMMUNITY): Payer: Self-pay

## 2024-10-12 ENCOUNTER — Telehealth (HOSPITAL_COMMUNITY): Payer: Self-pay

## 2024-10-12 DIAGNOSIS — I10 Essential (primary) hypertension: Secondary | ICD-10-CM | POA: Diagnosis not present

## 2024-10-12 DIAGNOSIS — L03116 Cellulitis of left lower limb: Secondary | ICD-10-CM | POA: Diagnosis not present

## 2024-10-12 DIAGNOSIS — G629 Polyneuropathy, unspecified: Secondary | ICD-10-CM | POA: Diagnosis not present

## 2024-10-12 DIAGNOSIS — L089 Local infection of the skin and subcutaneous tissue, unspecified: Secondary | ICD-10-CM | POA: Diagnosis not present

## 2024-10-12 DIAGNOSIS — E11628 Type 2 diabetes mellitus with other skin complications: Secondary | ICD-10-CM | POA: Diagnosis not present

## 2024-10-12 DIAGNOSIS — E785 Hyperlipidemia, unspecified: Secondary | ICD-10-CM | POA: Diagnosis not present

## 2024-10-12 LAB — COMPREHENSIVE METABOLIC PANEL WITH GFR
ALT: 15 U/L (ref 0–44)
AST: 18 U/L (ref 15–41)
Albumin: 3.6 g/dL (ref 3.5–5.0)
Alkaline Phosphatase: 97 U/L (ref 38–126)
Anion gap: 11 (ref 5–15)
BUN: 25 mg/dL — ABNORMAL HIGH (ref 6–20)
CO2: 26 mmol/L (ref 22–32)
Calcium: 9.1 mg/dL (ref 8.9–10.3)
Chloride: 100 mmol/L (ref 98–111)
Creatinine, Ser: 1.05 mg/dL (ref 0.61–1.24)
GFR, Estimated: >60 mL/min
Glucose, Bld: 135 mg/dL — ABNORMAL HIGH (ref 70–99)
Potassium: 4 mmol/L (ref 3.5–5.1)
Sodium: 137 mmol/L (ref 135–145)
Total Bilirubin: 0.3 mg/dL (ref 0.0–1.2)
Total Protein: 6.8 g/dL (ref 6.5–8.1)

## 2024-10-12 LAB — CBC
HCT: 32.8 % — ABNORMAL LOW (ref 39.0–52.0)
Hemoglobin: 11 g/dL — ABNORMAL LOW (ref 13.0–17.0)
MCH: 29 pg (ref 26.0–34.0)
MCHC: 33.5 g/dL (ref 30.0–36.0)
MCV: 86.5 fL (ref 80.0–100.0)
Platelets: 290 10*3/uL (ref 150–400)
RBC: 3.79 MIL/uL — ABNORMAL LOW (ref 4.22–5.81)
RDW: 12.8 % (ref 11.5–15.5)
WBC: 9.5 10*3/uL (ref 4.0–10.5)
nRBC: 0 % (ref 0.0–0.2)

## 2024-10-12 LAB — GLUCOSE, CAPILLARY
Glucose-Capillary: 149 mg/dL — ABNORMAL HIGH (ref 70–99)
Glucose-Capillary: 220 mg/dL — ABNORMAL HIGH (ref 70–99)
Glucose-Capillary: 140 mg/dL — ABNORMAL HIGH (ref 70–99)
Glucose-Capillary: 183 mg/dL — ABNORMAL HIGH (ref 70–99)

## 2024-10-12 NOTE — Progress Notes (Signed)
 Patient ID: Douglas Murphy, male   DOB: 07/29/1966, 59 y.o.   MRN: [id number] I just came by to check on Douglas Murphy again.  I also reviewed the MRI of his left foot.  It did not show any abscess and no acute osteomyelitis.  He does understand that he has an open wound that can still lead to worsening infection.  His peripheral white blood cell count is normal now.  His blood glucose is under better control currently.  He does have an open wound around the area of his previous left foot first ray amputation.  Although he has a palpable dorsalis pedis pulse, it is can be difficult for him to heal this wound and he would likely need some type of surgical intervention with potentially backing up the first ray further or even a transmetatarsal amputation.  I will need to defer to my partner Dr. Harden when he is back around on Monday.  For now, I did place a new dressing and it looks like Vashe has been ordered for his wound daily which I think is a good idea.

## 2024-10-12 NOTE — Progress Notes (Signed)
 "  PROGRESS NOTE    Douglas Murphy  FMW:[id number] DOB: 12-16-1965 DOA: 10/11/2024 PCP: Shayne Anes, MD   Brief Narrative: Zachry Hopfensperger is a 59 y.o. male with a history of hypertension, diabetes mellitus type 2, diabetic foot infection, osteomyelitis, anemia, neuropathy.  Patient presented secondary to worsening left foot wound with evidence of cellulitis and concern for abscess and osteomyelitis. Orthopedic surgery consulted. Empiric antibiotics started. MRI pending.   Assessment and Plan:  Cellulitis of left foot Concern for possible abscess and/or osteomyelitis. Patient started empirically on Vancomycin  and Ceftriaxone . Blood cultures obtained and pending. Superficial wound culture obtained and is significant for GNRs - Follow-up MRI result - Continue Vancomycin  and Ceftriaxone   Diabetes mellitus type 2 Poorly controlled with hyperglycemia based on hemoglobin A1C of 11.0%.  - Continue Novolog  70/30 - Continue SSI  Primary hypertension Noted. Not on medication management.  Hyperlipidemia - Continue simvastatin   Normocytic anemia Mild. Stable.   DVT prophylaxis: SCDs Code Status:   Code Status: Full Code Family Communication: None at bedside Disposition Plan: Discharge pending orthopedic surgery recommendations and ongoing management of foot cellulitis   Consultants:  Orthopedic surgery  Procedures:  None  Antimicrobials: Vancomycin  Ceftriaxone     Subjective: Patient without specific concerns this morning. Hoping to not need surgery  Objective: BP 118/72 (BP Location: Left Arm)   Pulse 75   Temp 98.3 F (36.8 C) (Oral)   Resp 18   Ht 6' (1.829 m)   Wt 91 kg   SpO2 99%   BMI 27.21 kg/m   Examination:  General exam: Appears calm and comfortable. Respiratory system: Clear to auscultation. Respiratory effort normal. Cardiovascular system: S1 & S2 heard, RRR.  Gastrointestinal system: Abdomen is nondistended, soft and nontender. Normal  bowel sounds heard. Central nervous system: Alert and oriented. No focal neurological deficits. Musculoskeletal: No edema. No calf tenderness. Left foot in dressing. Psychiatry: Judgement and insight appear normal. Mood & affect appropriate.    Data Reviewed: I have personally reviewed following labs and imaging studies  CBC Lab Results  Component Value Date   WBC 9.5 10/12/2024   RBC 3.79 (L) 10/12/2024   HGB 11.0 (L) 10/12/2024   HCT 32.8 (L) 10/12/2024   MCV 86.5 10/12/2024   MCH 29.0 10/12/2024   PLT 290 10/12/2024   MCHC 33.5 10/12/2024   RDW 12.8 10/12/2024   LYMPHSABS 1.6 10/11/2024   MONOABS 1.2 (H) 10/11/2024   EOSABS 0.4 10/11/2024   BASOSABS 0.0 10/11/2024     Last metabolic panel Lab Results  Component Value Date   NA 137 10/12/2024   K 4.0 10/12/2024   CL 100 10/12/2024   CO2 26 10/12/2024   BUN 25 (H) 10/12/2024   CREATININE 1.05 10/12/2024   GLUCOSE 135 (H) 10/12/2024   GFRNONAA >60 10/12/2024   GFRAA >60 07/31/2018   CALCIUM 9.1 10/12/2024   PHOS 3.2 02/18/2023   PROT 6.8 10/12/2024   ALBUMIN 3.6 10/12/2024   BILITOT 0.3 10/12/2024   ALKPHOS 97 10/12/2024   AST 18 10/12/2024   ALT 15 10/12/2024   ANIONGAP 11 10/12/2024    GFR: Estimated Creatinine Clearance: 86.2 mL/min (by C-G formula based on SCr of 1.05 mg/dL).  Recent Results (from the past 240 hours)  Culture, blood (routine x 2)     Status: None (Preliminary result)   Collection Time: 10/11/24  2:33 PM   Specimen: BLOOD  Result Value Ref Range Status   Specimen Description BLOOD RIGHT ANTECUBITAL  Final   Special  Requests   Final    BOTTLES DRAWN AEROBIC AND ANAEROBIC Blood Culture adequate volume   Culture   Final    NO GROWTH < 24 HOURS Performed at [facility], [address], [place], [place] [address]    Report Status PENDING  Incomplete  Culture, blood (routine x 2)     Status: None (Preliminary result)   Collection Time: 10/11/24  2:33 PM   Specimen: BLOOD   Result Value Ref Range Status   Specimen Description BLOOD LEFT ANTECUBITAL  Final   Special Requests   Final    BOTTLES DRAWN AEROBIC AND ANAEROBIC Blood Culture adequate volume   Culture   Final    NO GROWTH < 24 HOURS Performed at [facility], [address] [place], [place] [address]    Report Status PENDING  Incomplete  Aerobic Culture w Gram Stain (superficial specimen)     Status: None (Preliminary result)   Collection Time: 10/11/24  4:40 PM   Specimen: Wound  Result Value Ref Range Status   Specimen Description WOUND  Final   Special Requests LEFT FOOT  Final   Gram Stain   Final    FEW WBC PRESENT, PREDOMINANTLY PMN ABUNDANT GRAM NEGATIVE RODS RARE GRAM POSITIVE COCCI    Culture   Final    CULTURE REINCUBATED FOR BETTER GROWTH Performed at [facility], [address], [place], [place] [address]    Report Status PENDING  Incomplete      Radiology Studies: DG Foot Complete Left Result Date: 10/10/2024 CLINICAL DATA:  Concern for infection. EXAM: LEFT FOOT - COMPLETE 3+ VIEW COMPARISON:  Left foot radiograph dated 07/18/2023. FINDINGS: Amputation of the first digit. No acute fracture or dislocation. There is soft tissue swelling of the ball of the foot similar or slightly progressed since the prior radiograph. No radiopaque foreign object or soft tissue gas. IMPRESSION: 1. No acute fracture or dislocation. 2. Soft tissue swelling of the plantar aspect along the first metatarsal head. Electronically Signed   By: Vanetta Chou M.D.   On: 10/10/2024 18:58      LOS: 1 day    Elgin Lam, MD [facility] 10/12/2024, 8:00 AM   If 7PM-7AM, please contact night-coverage [email or web address]  "

## 2024-10-12 NOTE — Plan of Care (Signed)
 " Problem: Education: Goal: Ability to describe self-care measures that may prevent or decrease complications (Diabetes Survival Skills Education) will improve Outcome: Progressing Goal: Individualized Educational Video(s) Outcome: Progressing   Problem: Coping: Goal: Ability to adjust to condition or change in health will improve Outcome: Progressing   Problem: Fluid Volume: Goal: Ability to maintain a balanced intake and output will improve Outcome: Progressing   Problem: Health Behavior/Discharge Planning: Goal: Ability to identify and utilize available resources and services will improve Outcome: Progressing Goal: Ability to manage health-related needs will improve Outcome: Progressing   Problem: Metabolic: Goal: Ability to maintain appropriate glucose levels will improve Outcome: Progressing   Problem: Nutritional: Goal: Maintenance of adequate nutrition will improve Outcome: Progressing Goal: Progress toward achieving an optimal weight will improve Outcome: Progressing   Problem: Skin Integrity: Goal: Risk for impaired skin integrity will decrease Outcome: Progressing   Problem: Tissue Perfusion: Goal: Adequacy of tissue perfusion will improve Outcome: Progressing   Problem: Clinical Measurements: Goal: Ability to avoid or minimize complications of infection will improve Outcome: Progressing   Problem: Skin Integrity: Goal: Skin integrity will improve Outcome: Progressing   Problem: Education: Goal: Knowledge of General Education information will improve Description: Including pain rating scale, medication(s)/side effects and non-pharmacologic comfort measures Outcome: Progressing   Problem: Health Behavior/Discharge Planning: Goal: Ability to manage health-related needs will improve Outcome: Progressing   Problem: Clinical Measurements: Goal: Ability to maintain clinical measurements within normal limits will improve Outcome: Progressing Goal: Will  remain free from infection Outcome: Progressing Goal: Diagnostic test results will improve Outcome: Progressing Goal: Respiratory complications will improve Outcome: Progressing Goal: Cardiovascular complication will be avoided Outcome: Progressing   Problem: Activity: Goal: Risk for activity intolerance will decrease Outcome: Progressing   Problem: Nutrition: Goal: Adequate nutrition will be maintained Outcome: Progressing   Problem: Coping: Goal: Level of anxiety will decrease Outcome: Progressing   Problem: Elimination: Goal: Will not experience complications related to bowel motility Outcome: Progressing Goal: Will not experience complications related to urinary retention Outcome: Progressing   Problem: Pain Managment: Goal: General experience of comfort will improve and/or be controlled Outcome: Progressing   Problem: Safety: Goal: Ability to remain free from injury will improve Outcome: Progressing   Problem: Skin Integrity: Goal: Risk for impaired skin integrity will decrease Outcome: Progressing   Problem: Education: Goal: Knowledge of General Education information will improve Description: Including pain rating scale, medication(s)/side effects and non-pharmacologic comfort measures Outcome: Progressing   Problem: Health Behavior/Discharge Planning: Goal: Ability to manage health-related needs will improve Outcome: Progressing   Problem: Clinical Measurements: Goal: Ability to maintain clinical measurements within normal limits will improve Outcome: Progressing Goal: Will remain free from infection Outcome: Progressing Goal: Diagnostic test results will improve Outcome: Progressing Goal: Respiratory complications will improve Outcome: Progressing Goal: Cardiovascular complication will be avoided Outcome: Progressing   Problem: Activity: Goal: Risk for activity intolerance will decrease Outcome: Progressing   Problem: Nutrition: Goal: Adequate  nutrition will be maintained Outcome: Progressing   Problem: Coping: Goal: Level of anxiety will decrease Outcome: Progressing   Problem: Elimination: Goal: Will not experience complications related to bowel motility Outcome: Progressing Goal: Will not experience complications related to urinary retention Outcome: Progressing   Problem: Pain Managment: Goal: General experience of comfort will improve and/or be controlled Outcome: Progressing   Problem: Safety: Goal: Ability to remain free from injury will improve Outcome: Progressing   Problem: Clinical Measurements: Goal: Ability to avoid or minimize complications of infection will improve Outcome: Progressing  Problem: Skin Integrity: Goal: Skin integrity will improve Outcome: Progressing   "

## 2024-10-12 NOTE — Plan of Care (Signed)
  Problem: Education: Goal: Knowledge of General Education information will improve Description: Including pain rating scale, medication(s)/side effects and non-pharmacologic comfort measures Outcome: Progressing   Problem: Clinical Measurements: Goal: Respiratory complications will improve Outcome: Progressing   Problem: Activity: Goal: Risk for activity intolerance will decrease Outcome: Progressing   Problem: Pain Managment: Goal: General experience of comfort will improve and/or be controlled Outcome: Progressing

## 2024-10-12 NOTE — TOC CM/SW Note (Signed)
 Transition of Care [facility]) - Inpatient Brief Assessment   Patient Details  Name: Douglas Murphy MRN: [id number] Date of Birth: August 01, 1966  Transition of Care [facility]) CM/SW Contact:    Tom-Johnson, Harvest Muskrat, RN Phone Number: 10/12/2024, 10:31 AM   Clinical Narrative:  Patient presented to the ED with worsening Lt Foot Wound and Pain. 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 Ortho consulted, on IV abx. Awaiting MRI results.   CM spoke with patient at bedside about needs for post hospital transition. Patient lives with his mother and her boyfriend. Does not have children, has a supportive brother. Currently employed and drive self. Independent with care prior to admit. Does not have DME's at home.  PCP is Shayne Anes, MD and uses [employer] at [facility].   No ICM needs or recommendations noted at this time.  Patient not Medically ready for discharge.  CM will continue to follow as patient progresses with care towards discharge.     Transition of Care Asessment: Insurance and Status: Insurance coverage has been reviewed Patient has primary care physician: Yes Home environment has been reviewed: Yes Prior level of function:: Independent Prior/Current Home Services: No current home services Social Drivers of Health Review: SDOH reviewed no interventions necessary Readmission risk has been reviewed: Yes Transition of care needs: no transition of care needs at this time

## 2024-10-12 NOTE — Hospital Course (Signed)
 Douglas Murphy is a 59 y.o. male with a history of hypertension, diabetes mellitus type 2, diabetic foot infection, osteomyelitis, anemia, neuropathy.  Patient presented secondary to worsening left foot wound with evidence of cellulitis and concern for abscess and osteomyelitis. Orthopedic surgery consulted. Empiric antibiotics started. MRI pending.

## 2024-10-13 DIAGNOSIS — L03116 Cellulitis of left lower limb: Secondary | ICD-10-CM | POA: Diagnosis not present

## 2024-10-13 DIAGNOSIS — E11628 Type 2 diabetes mellitus with other skin complications: Secondary | ICD-10-CM | POA: Diagnosis not present

## 2024-10-13 DIAGNOSIS — I10 Essential (primary) hypertension: Secondary | ICD-10-CM | POA: Diagnosis not present

## 2024-10-13 DIAGNOSIS — G629 Polyneuropathy, unspecified: Secondary | ICD-10-CM | POA: Diagnosis not present

## 2024-10-13 LAB — MISC LABCORP TEST (SEND OUT): Labcorp test code: 83935

## 2024-10-13 LAB — GLUCOSE, CAPILLARY
Glucose-Capillary: 204 mg/dL — ABNORMAL HIGH (ref 70–99)
Glucose-Capillary: 270 mg/dL — ABNORMAL HIGH (ref 70–99)
Glucose-Capillary: 140 mg/dL — ABNORMAL HIGH (ref 70–99)
Glucose-Capillary: 106 mg/dL — ABNORMAL HIGH (ref 70–99)

## 2024-10-13 NOTE — Plan of Care (Signed)
" °  Problem: Education: Goal: Ability to describe self-care measures that may prevent or decrease complications (Diabetes Survival Skills Education) will improve Outcome: Progressing Goal: Individualized Educational Video(s) Outcome: Progressing   Problem: Coping: Goal: Ability to adjust to condition or change in health will improve Outcome: Progressing   Problem: Coping: Goal: Ability to adjust to condition or change in health will improve Outcome: Progressing   Problem: Fluid Volume: Goal: Ability to maintain a balanced intake and output will improve Outcome: Progressing   "

## 2024-10-13 NOTE — Progress Notes (Signed)
 "  PROGRESS NOTE    Douglas Murphy  FMW:[id number] DOB: Jun 09, 1966 DOA: 10/11/2024 PCP: Shayne Anes, MD   Brief Narrative: Douglas Murphy is a 59 y.o. male with a history of hypertension, diabetes mellitus type 2, diabetic foot infection, osteomyelitis, anemia, neuropathy.  Patient presented secondary to worsening left foot wound with evidence of cellulitis and concern for abscess and osteomyelitis. Orthopedic surgery consulted. Empiric antibiotics started. MRI pending.   Assessment and Plan:  Cellulitis of left foot Concern for possible abscess and/or osteomyelitis. Patient started empirically on Vancomycin  and Ceftriaxone . Blood cultures obtained and pending. Superficial wound culture obtained and is significant for GNRs and GPCs. MRI reveals no abscess and no evidence of osteomyelitis. - Continue Vancomycin  and Ceftriaxone  - Follow-up wound culture results  Diabetes mellitus type 2 Poorly controlled with hyperglycemia based on hemoglobin A1C of 11.0%.  - Continue Novolog  70/30 - Continue SSI  Primary hypertension Noted. Not on medication management.  Hyperlipidemia - Continue simvastatin   Normocytic anemia Mild. Stable.   DVT prophylaxis: SCDs Code Status:   Code Status: Full Code Family Communication: None at bedside Disposition Plan: Discharge likely in 2 days pending continued orthopedic surgery recommendations and transition to oral antibiotics   Consultants:  Orthopedic surgery  Procedures:  None  Antimicrobials: Vancomycin  Ceftriaxone     Subjective: No concerns this morning.  Objective: BP 117/79   Pulse 84   Temp 97.8 F (36.6 C)   Resp 18   Ht 6' (1.829 m)   Wt 91 kg   SpO2 99%   BMI 27.21 kg/m   Examination:  General exam: Appears calm and comfortable. Respiratory system: Clear to auscultation. Respiratory effort normal. Cardiovascular system: S1 & S2 heard, RRR.  Gastrointestinal system: Abdomen is nondistended, soft and  nontender. Normal bowel sounds heard. Central nervous system: Alert and oriented. No focal neurological deficits. Musculoskeletal: No edema. No calf tenderness. Left foot in dressing. Psychiatry: Judgement and insight appear normal. Mood & affect appropriate.    Data Reviewed: I have personally reviewed following labs and imaging studies  CBC Lab Results  Component Value Date   WBC 9.5 10/12/2024   RBC 3.79 (L) 10/12/2024   HGB 11.0 (L) 10/12/2024   HCT 32.8 (L) 10/12/2024   MCV 86.5 10/12/2024   MCH 29.0 10/12/2024   PLT 290 10/12/2024   MCHC 33.5 10/12/2024   RDW 12.8 10/12/2024   LYMPHSABS 1.6 10/11/2024   MONOABS 1.2 (H) 10/11/2024   EOSABS 0.4 10/11/2024   BASOSABS 0.0 10/11/2024     Last metabolic panel Lab Results  Component Value Date   NA 137 10/12/2024   K 4.0 10/12/2024   CL 100 10/12/2024   CO2 26 10/12/2024   BUN 25 (H) 10/12/2024   CREATININE 1.05 10/12/2024   GLUCOSE 135 (H) 10/12/2024   GFRNONAA >60 10/12/2024   GFRAA >60 07/31/2018   CALCIUM 9.1 10/12/2024   PHOS 3.2 02/18/2023   PROT 6.8 10/12/2024   ALBUMIN 3.6 10/12/2024   BILITOT 0.3 10/12/2024   ALKPHOS 97 10/12/2024   AST 18 10/12/2024   ALT 15 10/12/2024   ANIONGAP 11 10/12/2024    GFR: Estimated Creatinine Clearance: 86.2 mL/min (by C-G formula based on SCr of 1.05 mg/dL).  Recent Results (from the past 240 hours)  Culture, blood (routine x 2)     Status: None (Preliminary result)   Collection Time: 10/11/24  2:33 PM   Specimen: BLOOD  Result Value Ref Range Status   Specimen Description BLOOD RIGHT ANTECUBITAL  Final   Special Requests   Final    BOTTLES DRAWN AEROBIC AND ANAEROBIC Blood Culture adequate volume   Culture   Final    NO GROWTH 2 DAYS Performed at [facility], [address], [place], [place] [address]    Report Status PENDING  Incomplete  Culture, blood (routine x 2)     Status: None (Preliminary result)   Collection Time: 10/11/24  2:33 PM   Specimen:  BLOOD  Result Value Ref Range Status   Specimen Description BLOOD LEFT ANTECUBITAL  Final   Special Requests   Final    BOTTLES DRAWN AEROBIC AND ANAEROBIC Blood Culture adequate volume   Culture   Final    NO GROWTH 2 DAYS Performed at [facility], [address], [place], [place] [address]    Report Status PENDING  Incomplete  Aerobic Culture w Gram Stain (superficial specimen)     Status: None (Preliminary result)   Collection Time: 10/11/24  4:40 PM   Specimen: Wound  Result Value Ref Range Status   Specimen Description WOUND  Final   Special Requests LEFT FOOT  Final   Gram Stain   Final    FEW WBC PRESENT, PREDOMINANTLY PMN ABUNDANT GRAM NEGATIVE RODS RARE GRAM POSITIVE COCCI    Culture   Final    CULTURE REINCUBATED FOR BETTER GROWTH Performed at [facility], [address] [place], [place] [address]    Report Status PENDING  Incomplete      Radiology Studies: MR FOOT LEFT WO CONTRAST Result Date: 10/12/2024 CLINICAL DATA:  Diabetic foot ulcer. Prior amputation of the great toe. Concern for infection. EXAM: MRI OF THE LEFT FOOT WITHOUT CONTRAST TECHNIQUE: Multiplanar, multisequence MR imaging of the left foot was performed. No intravenous contrast was administered. COMPARISON:  Radiographs of the left foot a 10/10/2024. MRI of the left foot dated 02/12/2023. FINDINGS: Bones/Joint/Cartilage Prior amputation of the left great toe at the level of the first MTP joint. No marrow signal abnormality. No evidence of acute osteomyelitis. No acute fracture or dislocation. Degenerative changes of the hallux sesamoid-first metatarsal head articulation. Mild osteoarthritis of the midfoot. No significant joint effusion. Ligaments Lisfranc ligament is intact. Capsular thickening of the second MTP joint is again noted. Muscles and Tendons Mild edema like signal of the intrinsic foot musculature likely reflects chronic denervation changes, however, myositis can not be entirely  excluded. No loculated intramuscular collection. No significant tenosynovitis. Soft tissue Wound/ulceration of the plantar medial forefoot overlying the first metatarsal head with surrounding cutaneous thickening and underlying subcutaneous soft tissue edema. No loculated fluid collection. IMPRESSION: 1. Wound/ulceration of the plantar medial forefoot overlying the first metatarsal head with surrounding cutaneous thickening and underlying subcutaneous soft tissue edema. No loculated fluid collection. Cellulitis is not excluded. 2. Prior amputation of the left great toe at the level of the first MTP joint. No evidence of acute osteomyelitis. 3. Mild edema like signal of the intrinsic foot musculature likely reflects chronic denervation changes, however, myositis can not be entirely excluded. No loculated intramuscular collection. 4. Degenerative changes of the hallux sesamoid-first metatarsal head articulation. Electronically Signed   By: Harrietta Sherry M.D.   On: 10/12/2024 09:46      LOS: 2 days    Elgin Lam, MD [facility] 10/13/2024, 9:43 AM   If 7PM-7AM, please contact night-coverage [email or web address]  "

## 2024-10-13 NOTE — Consult Note (Signed)
" °  CLINICAL SUPPORT TEAM - WOUND OSTOMY AND CONTINENCE TEAM  CONSULTATION SERVICES   WOC Nurse-Inpatient Note  WOC Nurse Consult Note: wound being followed by ortho see notes 1/29 and 1/30 Reason for Consult: L plantar foot wound  Wound type: full thickness L great toe onto plantar foot r/t  diabetic ulcer  Pressure Injury POA: NA  Measurement: see nursing flowsheet  Wound bed: red moist with some tan tissue noted  Drainage (amount, consistency, odor) appears tan  Periwound: erythema  Dressing procedure/placement/frequency: Cleanse L great toe/plantar foot wound with Vashe, do not rinse. Apply silver hydrofiber (Aquacel AG WD#[id number]/[place] [id number]) to wound bed daily, cover with dry gauze and secure with Kerlix roll gauze/ACE bandage.   This wound will be followed by Dr. Harden ongoing. Any wound care orders placed by Dr. Harden supercede wound care orders placed by [facility] RN.   WOC team will not follow. REconsult if further needs arise.   Thank you,    Rochell Puett MSN, RN-BC, CWOCN     "

## 2024-10-14 DIAGNOSIS — G629 Polyneuropathy, unspecified: Secondary | ICD-10-CM | POA: Diagnosis not present

## 2024-10-14 DIAGNOSIS — E11628 Type 2 diabetes mellitus with other skin complications: Secondary | ICD-10-CM | POA: Diagnosis not present

## 2024-10-14 DIAGNOSIS — L03116 Cellulitis of left lower limb: Secondary | ICD-10-CM | POA: Diagnosis not present

## 2024-10-14 DIAGNOSIS — I10 Essential (primary) hypertension: Secondary | ICD-10-CM | POA: Diagnosis not present

## 2024-10-14 LAB — GLUCOSE, CAPILLARY
Glucose-Capillary: 191 mg/dL — ABNORMAL HIGH (ref 70–99)
Glucose-Capillary: 168 mg/dL — ABNORMAL HIGH (ref 70–99)
Glucose-Capillary: 225 mg/dL — ABNORMAL HIGH (ref 70–99)
Glucose-Capillary: 205 mg/dL — ABNORMAL HIGH (ref 70–99)

## 2024-10-14 LAB — AEROBIC CULTURE W GRAM STAIN (SUPERFICIAL SPECIMEN)

## 2024-10-14 MED ORDER — SODIUM CHLORIDE 0.9 % IV SOLN
2.0000 g | INTRAVENOUS | Status: DC
  Administered 2024-10-14 – 2024-10-15 (×2): 2 g via INTRAVENOUS
  Filled 2024-10-14 (×2): qty 20

## 2024-10-14 MED ORDER — ENOXAPARIN SODIUM 40 MG/0.4ML IJ SOSY
40.0000 mg | PREFILLED_SYRINGE | INTRAMUSCULAR | Status: DC
  Administered 2024-10-14 – 2024-10-15 (×2): 40 mg via SUBCUTANEOUS
  Filled 2024-10-14 (×2): qty 0.4

## 2024-10-14 NOTE — Progress Notes (Signed)
 "  PROGRESS NOTE    Douglas Murphy  FMW:[id number] DOB: [birth year] DOA: 10/11/2024 PCP: Shayne Anes, MD   Brief Narrative: Douglas Murphy is a 59 y.o. male with a history of hypertension, diabetes mellitus type 2, diabetic foot infection, osteomyelitis, anemia, neuropathy.  Patient presented secondary to worsening left foot wound with evidence of cellulitis and concern for abscess and osteomyelitis. Orthopedic surgery consulted. Empiric antibiotics started. MRI confirms no osteomyelitis. Superficial wound culture significant for streptococcus group G.   Assessment and Plan:  Cellulitis of left foot Concern for possible abscess and/or osteomyelitis. Patient started empirically on Vancomycin  and Ceftriaxone . Blood cultures obtained and pending. Superficial wound culture obtained and gram stain significant for GNRs and GPCs; culture significant for streptococcus group G. MRI reveals no abscess and no evidence of osteomyelitis. - Discontinue Vancomycin  - Continue Ceftriaxone   Diabetes mellitus type 2 Poorly controlled with hyperglycemia based on hemoglobin A1C of 11.0%.  - Continue Novolog  70/30 - Continue SSI  Primary hypertension Noted. Not on medication management.  Hyperlipidemia - Continue simvastatin   Normocytic anemia Mild. Stable.   DVT prophylaxis: SCDs Code Status:   Code Status: Full Code Family Communication: None at bedside Disposition Plan: Discharge likely in 1 day pending continued orthopedic surgery recommendations and transition to oral antibiotics   Consultants:  Orthopedic surgery  Procedures:  None  Antimicrobials: Vancomycin  Ceftriaxone     Subjective: No issues this morning.  Objective: BP 122/87 (BP Location: Left Arm)   Pulse 86   Temp 99.5 F (37.5 C) (Oral)   Resp 18   Ht 6' (1.829 m)   Wt 91 kg   SpO2 100%   BMI 27.21 kg/m   Examination:  General exam: Appears calm and comfortable. Respiratory system: Respiratory  effort normal. Central nervous system: Alert and oriented. Psychiatry: Judgement and insight appear normal. Mood & affect appropriate.    Data Reviewed: I have personally reviewed following labs and imaging studies  CBC Lab Results  Component Value Date   WBC 9.5 10/12/2024   RBC 3.79 (L) 10/12/2024   HGB 11.0 (L) 10/12/2024   HCT 32.8 (L) 10/12/2024   MCV 86.5 10/12/2024   MCH 29.0 10/12/2024   PLT 290 10/12/2024   MCHC 33.5 10/12/2024   RDW 12.8 10/12/2024   LYMPHSABS 1.6 10/11/2024   MONOABS 1.2 (H) 10/11/2024   EOSABS 0.4 10/11/2024   BASOSABS 0.0 10/11/2024     Last metabolic panel Lab Results  Component Value Date   NA 137 10/12/2024   K 4.0 10/12/2024   CL 100 10/12/2024   CO2 26 10/12/2024   BUN 25 (H) 10/12/2024   CREATININE 1.05 10/12/2024   GLUCOSE 135 (H) 10/12/2024   GFRNONAA >60 10/12/2024   GFRAA >60 07/31/2018   CALCIUM 9.1 10/12/2024   PHOS 3.2 02/18/2023   PROT 6.8 10/12/2024   ALBUMIN 3.6 10/12/2024   BILITOT 0.3 10/12/2024   ALKPHOS 97 10/12/2024   AST 18 10/12/2024   ALT 15 10/12/2024   ANIONGAP 11 10/12/2024    GFR: Estimated Creatinine Clearance: 86.2 mL/min (by C-G formula based on SCr of 1.05 mg/dL).  Recent Results (from the past 240 hours)  Culture, blood (routine x 2)     Status: None (Preliminary result)   Collection Time: 10/11/24  2:33 PM   Specimen: BLOOD  Result Value Ref Range Status   Specimen Description BLOOD RIGHT ANTECUBITAL  Final   Special Requests   Final    BOTTLES DRAWN AEROBIC AND ANAEROBIC Blood Culture adequate  volume   Culture   Final    NO GROWTH 3 DAYS Performed at [facility], [address] [place], [place] [address]    Report Status PENDING  Incomplete  Culture, blood (routine x 2)     Status: None (Preliminary result)   Collection Time: 10/11/24  2:33 PM   Specimen: BLOOD  Result Value Ref Range Status   Specimen Description BLOOD LEFT ANTECUBITAL  Final   Special Requests   Final     BOTTLES DRAWN AEROBIC AND ANAEROBIC Blood Culture adequate volume   Culture   Final    NO GROWTH 3 DAYS Performed at [facility], [address], [place], [place] [address]    Report Status PENDING  Incomplete  Aerobic Culture w Gram Stain (superficial specimen)     Status: None   Collection Time: 10/11/24  4:40 PM   Specimen: Wound  Result Value Ref Range Status   Specimen Description WOUND  Final   Special Requests LEFT FOOT  Final   Gram Stain   Final    FEW WBC PRESENT, PREDOMINANTLY PMN ABUNDANT GRAM NEGATIVE RODS RARE GRAM POSITIVE COCCI    Culture   Final    ABUNDANT STREPTOCOCCUS GROUP G Beta hemolytic streptococci are predictably susceptible to penicillin  and other beta lactams. Susceptibility testing not routinely performed. Performed at [facility], [address] [place], [place] [address]    Report Status 10/14/2024 FINAL  Final      Radiology Studies: No results found.     LOS: 3 days    Elgin Lam, MD [facility] 10/14/2024, 12:35 PM   If 7PM-7AM, please contact night-coverage [email or web address]  "

## 2024-10-14 NOTE — Plan of Care (Signed)

## 2024-10-14 NOTE — Progress Notes (Signed)
 Ok to add lovenox  for DVT px per Dr Briana.  Sergio Batch, PharmD, BCIDP, AAHIVP, CPP Infectious Disease Pharmacist 10/14/2024 1:24 PM

## 2024-10-15 DIAGNOSIS — E11628 Type 2 diabetes mellitus with other skin complications: Secondary | ICD-10-CM | POA: Diagnosis not present

## 2024-10-15 DIAGNOSIS — L03116 Cellulitis of left lower limb: Secondary | ICD-10-CM | POA: Diagnosis not present

## 2024-10-15 DIAGNOSIS — L089 Local infection of the skin and subcutaneous tissue, unspecified: Secondary | ICD-10-CM | POA: Diagnosis not present

## 2024-10-15 LAB — GLUCOSE, CAPILLARY
Glucose-Capillary: 230 mg/dL — ABNORMAL HIGH (ref 70–99)
Glucose-Capillary: 196 mg/dL — ABNORMAL HIGH (ref 70–99)

## 2024-10-15 MED ORDER — CEFADROXIL 500 MG PO CAPS: 500.0000 mg | ORAL_CAPSULE | Freq: Two times a day (BID) | ORAL | 0 refills | Status: AC

## 2024-10-15 MED ORDER — DOXYCYCLINE HYCLATE 100 MG PO TABS: 100.0000 mg | ORAL_TABLET | Freq: Two times a day (BID) | ORAL | 0 refills | Status: AC

## 2024-10-15 NOTE — Discharge Instructions (Addendum)
 Douglas Murphy,  You were in the hospital with a foot infection. Thankfully there was no infection of your bone. Please continue the wound care and your antibiotics. Please follow-up with Dr. Harden.

## 2024-10-15 NOTE — Progress Notes (Signed)
 DISCHARGE NOTE HOME Douglas Murphy to be discharged Home per MD order. Discussed prescriptions and follow up appointments with the patient. Prescriptions given to patient; medication list explained in detail. Patient verbalized understanding.  Skin clean, dry and intact without evidence of skin break down, no evidence of skin tears noted. IV catheter discontinued intact. Site without signs and symptoms of complications. Dressing and pressure applied. Pt denies pain at the site currently. No complaints noted.  Patient free of lines, drains, and wounds.   An After Visit Summary (AVS) was printed and given to the patient. Patient escorted via wheelchair, and discharged home via private auto.  Doyal Sias, RN

## 2024-10-16 LAB — CULTURE, BLOOD (ROUTINE X 2)
Culture: NO GROWTH
Culture: NO GROWTH
Special Requests: ADEQUATE
Special Requests: ADEQUATE

## 2024-10-18 ENCOUNTER — Telehealth: Payer: Self-pay | Admitting: Orthopedic Surgery

## 2024-10-18 NOTE — Telephone Encounter (Signed)
 Can you please call pt and make an appt for next week? He has not been in the office since November.

## 2024-10-18 NOTE — Telephone Encounter (Signed)
 Patient called. He would like to know if he should be working or not? He needs a work note. His cb# [phone]

## 2024-10-25 ENCOUNTER — Ambulatory Visit: Admitting: Orthopedic Surgery
